# Patient Record
Sex: Male | Born: 1935 | Race: White | Hispanic: No | Marital: Married | Smoking: Former smoker
Health system: Southern US, Community
[De-identification: ages and names within clinical notes are randomized; demographics above are authoritative.]

## PROBLEM LIST (undated history)

## (undated) DIAGNOSIS — Z8719 Personal history of other diseases of the digestive system: Secondary | ICD-10-CM

## (undated) DIAGNOSIS — G454 Transient global amnesia: Secondary | ICD-10-CM

## (undated) DIAGNOSIS — R413 Other amnesia: Secondary | ICD-10-CM

## (undated) DIAGNOSIS — E785 Hyperlipidemia, unspecified: Secondary | ICD-10-CM

## (undated) DIAGNOSIS — N400 Enlarged prostate without lower urinary tract symptoms: Secondary | ICD-10-CM

## (undated) DIAGNOSIS — R0600 Dyspnea, unspecified: Secondary | ICD-10-CM

## (undated) DIAGNOSIS — J449 Chronic obstructive pulmonary disease, unspecified: Secondary | ICD-10-CM

## (undated) DIAGNOSIS — K219 Gastro-esophageal reflux disease without esophagitis: Secondary | ICD-10-CM

## (undated) DIAGNOSIS — M1990 Unspecified osteoarthritis, unspecified site: Secondary | ICD-10-CM

## (undated) DIAGNOSIS — C4491 Basal cell carcinoma of skin, unspecified: Secondary | ICD-10-CM

## (undated) DIAGNOSIS — R42 Dizziness and giddiness: Secondary | ICD-10-CM

## (undated) DIAGNOSIS — M353 Polymyalgia rheumatica: Secondary | ICD-10-CM

## (undated) DIAGNOSIS — J45909 Unspecified asthma, uncomplicated: Secondary | ICD-10-CM

## (undated) DIAGNOSIS — I130 Hypertensive heart and chronic kidney disease with heart failure and stage 1 through stage 4 chronic kidney disease, or unspecified chronic kidney disease: Secondary | ICD-10-CM

## (undated) DIAGNOSIS — N184 Chronic kidney disease, stage 4 (severe): Secondary | ICD-10-CM

## (undated) DIAGNOSIS — I10 Essential (primary) hypertension: Secondary | ICD-10-CM

## (undated) DIAGNOSIS — L719 Rosacea, unspecified: Secondary | ICD-10-CM

## (undated) DIAGNOSIS — G2581 Restless legs syndrome: Secondary | ICD-10-CM

## (undated) DIAGNOSIS — G4730 Sleep apnea, unspecified: Secondary | ICD-10-CM

## (undated) HISTORY — DX: Benign prostatic hyperplasia without lower urinary tract symptoms: N40.0

## (undated) HISTORY — PX: SHOULDER SURGERY: SHX246

## (undated) HISTORY — PX: OTHER SURGICAL HISTORY: SHX169

## (undated) HISTORY — DX: Other amnesia: R41.3

## (undated) HISTORY — DX: Hypertensive heart and chronic kidney disease with heart failure and stage 1 through stage 4 chronic kidney disease, or unspecified chronic kidney disease: I13.0

## (undated) HISTORY — DX: Restless legs syndrome: G25.81

## (undated) HISTORY — DX: Personal history of other diseases of the digestive system: Z87.19

## (undated) HISTORY — DX: Unspecified asthma, uncomplicated: J45.909

## (undated) HISTORY — DX: Dizziness and giddiness: R42

## (undated) HISTORY — DX: Chronic obstructive pulmonary disease, unspecified: J44.9

## (undated) HISTORY — DX: Polymyalgia rheumatica: M35.3

## (undated) HISTORY — DX: Hyperlipidemia, unspecified: E78.5

## (undated) HISTORY — DX: Transient global amnesia: G45.4

## (undated) HISTORY — DX: Gastro-esophageal reflux disease without esophagitis: K21.9

## (undated) HISTORY — DX: Hypocalcemia: E83.51

## (undated) HISTORY — DX: Chronic kidney disease, stage 4 (severe): N18.4

## (undated) HISTORY — DX: Rosacea, unspecified: L71.9

## (undated) HISTORY — DX: Sleep apnea, unspecified: G47.30

## (undated) HISTORY — PX: CARDIAC CATHETERIZATION: SHX172

---

## 1978-06-12 HISTORY — PX: BACK SURGERY: SHX140

## 2015-05-10 DIAGNOSIS — E785 Hyperlipidemia, unspecified: Secondary | ICD-10-CM | POA: Insufficient documentation

## 2015-06-17 DIAGNOSIS — J449 Chronic obstructive pulmonary disease, unspecified: Secondary | ICD-10-CM | POA: Diagnosis not present

## 2015-06-17 DIAGNOSIS — G2581 Restless legs syndrome: Secondary | ICD-10-CM | POA: Diagnosis not present

## 2015-06-17 DIAGNOSIS — R918 Other nonspecific abnormal finding of lung field: Secondary | ICD-10-CM | POA: Diagnosis not present

## 2015-06-30 DIAGNOSIS — E785 Hyperlipidemia, unspecified: Secondary | ICD-10-CM | POA: Diagnosis not present

## 2015-06-30 DIAGNOSIS — I10 Essential (primary) hypertension: Secondary | ICD-10-CM | POA: Diagnosis not present

## 2015-07-08 DIAGNOSIS — M65341 Trigger finger, right ring finger: Secondary | ICD-10-CM | POA: Diagnosis not present

## 2015-07-08 DIAGNOSIS — M65322 Trigger finger, left index finger: Secondary | ICD-10-CM | POA: Diagnosis not present

## 2015-07-08 DIAGNOSIS — M7541 Impingement syndrome of right shoulder: Secondary | ICD-10-CM | POA: Diagnosis not present

## 2015-07-08 DIAGNOSIS — M7711 Lateral epicondylitis, right elbow: Secondary | ICD-10-CM | POA: Diagnosis not present

## 2015-07-19 DIAGNOSIS — M7541 Impingement syndrome of right shoulder: Secondary | ICD-10-CM | POA: Diagnosis not present

## 2015-07-27 DIAGNOSIS — M7541 Impingement syndrome of right shoulder: Secondary | ICD-10-CM | POA: Diagnosis not present

## 2015-08-05 DIAGNOSIS — D649 Anemia, unspecified: Secondary | ICD-10-CM | POA: Insufficient documentation

## 2015-08-05 DIAGNOSIS — D7589 Other specified diseases of blood and blood-forming organs: Secondary | ICD-10-CM | POA: Insufficient documentation

## 2015-08-05 DIAGNOSIS — R7989 Other specified abnormal findings of blood chemistry: Secondary | ICD-10-CM | POA: Insufficient documentation

## 2015-08-05 DIAGNOSIS — L719 Rosacea, unspecified: Secondary | ICD-10-CM | POA: Diagnosis not present

## 2015-08-05 DIAGNOSIS — E785 Hyperlipidemia, unspecified: Secondary | ICD-10-CM | POA: Insufficient documentation

## 2015-08-05 DIAGNOSIS — L820 Inflamed seborrheic keratosis: Secondary | ICD-10-CM | POA: Diagnosis not present

## 2015-08-05 DIAGNOSIS — R222 Localized swelling, mass and lump, trunk: Secondary | ICD-10-CM | POA: Insufficient documentation

## 2015-08-06 DIAGNOSIS — R946 Abnormal results of thyroid function studies: Secondary | ICD-10-CM | POA: Diagnosis not present

## 2015-08-06 DIAGNOSIS — K219 Gastro-esophageal reflux disease without esophagitis: Secondary | ICD-10-CM | POA: Diagnosis not present

## 2015-08-06 DIAGNOSIS — Z0000 Encounter for general adult medical examination without abnormal findings: Secondary | ICD-10-CM | POA: Diagnosis not present

## 2015-08-06 DIAGNOSIS — N183 Chronic kidney disease, stage 3 (moderate): Secondary | ICD-10-CM | POA: Diagnosis not present

## 2015-08-06 DIAGNOSIS — I10 Essential (primary) hypertension: Secondary | ICD-10-CM | POA: Diagnosis not present

## 2015-08-06 DIAGNOSIS — J4520 Mild intermittent asthma, uncomplicated: Secondary | ICD-10-CM | POA: Diagnosis not present

## 2015-08-06 DIAGNOSIS — E7801 Familial hypercholesterolemia: Secondary | ICD-10-CM | POA: Diagnosis not present

## 2015-08-06 DIAGNOSIS — I130 Hypertensive heart and chronic kidney disease with heart failure and stage 1 through stage 4 chronic kidney disease, or unspecified chronic kidney disease: Secondary | ICD-10-CM | POA: Diagnosis not present

## 2015-08-06 DIAGNOSIS — I509 Heart failure, unspecified: Secondary | ICD-10-CM | POA: Diagnosis not present

## 2015-08-09 DIAGNOSIS — K219 Gastro-esophageal reflux disease without esophagitis: Secondary | ICD-10-CM | POA: Insufficient documentation

## 2015-08-20 DIAGNOSIS — N401 Enlarged prostate with lower urinary tract symptoms: Secondary | ICD-10-CM | POA: Diagnosis not present

## 2015-08-20 DIAGNOSIS — N138 Other obstructive and reflux uropathy: Secondary | ICD-10-CM | POA: Diagnosis not present

## 2015-08-26 DIAGNOSIS — Z125 Encounter for screening for malignant neoplasm of prostate: Secondary | ICD-10-CM | POA: Diagnosis not present

## 2015-08-27 DIAGNOSIS — J22 Unspecified acute lower respiratory infection: Secondary | ICD-10-CM | POA: Diagnosis not present

## 2015-09-02 DIAGNOSIS — K869 Disease of pancreas, unspecified: Secondary | ICD-10-CM | POA: Diagnosis not present

## 2015-09-02 DIAGNOSIS — R05 Cough: Secondary | ICD-10-CM | POA: Diagnosis not present

## 2015-09-02 DIAGNOSIS — N400 Enlarged prostate without lower urinary tract symptoms: Secondary | ICD-10-CM | POA: Diagnosis not present

## 2015-09-06 DIAGNOSIS — J22 Unspecified acute lower respiratory infection: Secondary | ICD-10-CM | POA: Diagnosis not present

## 2015-09-13 DIAGNOSIS — J42 Unspecified chronic bronchitis: Secondary | ICD-10-CM | POA: Diagnosis not present

## 2015-09-14 DIAGNOSIS — K760 Fatty (change of) liver, not elsewhere classified: Secondary | ICD-10-CM | POA: Diagnosis not present

## 2015-09-14 DIAGNOSIS — K862 Cyst of pancreas: Secondary | ICD-10-CM | POA: Diagnosis not present

## 2015-09-14 DIAGNOSIS — K7689 Other specified diseases of liver: Secondary | ICD-10-CM | POA: Diagnosis not present

## 2015-09-14 DIAGNOSIS — K8689 Other specified diseases of pancreas: Secondary | ICD-10-CM | POA: Diagnosis not present

## 2015-09-14 DIAGNOSIS — N281 Cyst of kidney, acquired: Secondary | ICD-10-CM | POA: Diagnosis not present

## 2015-09-14 DIAGNOSIS — K863 Pseudocyst of pancreas: Secondary | ICD-10-CM | POA: Diagnosis not present

## 2015-09-14 DIAGNOSIS — K869 Disease of pancreas, unspecified: Secondary | ICD-10-CM | POA: Diagnosis not present

## 2015-09-27 DIAGNOSIS — R918 Other nonspecific abnormal finding of lung field: Secondary | ICD-10-CM | POA: Diagnosis not present

## 2015-09-27 DIAGNOSIS — J449 Chronic obstructive pulmonary disease, unspecified: Secondary | ICD-10-CM | POA: Diagnosis not present

## 2015-09-30 DIAGNOSIS — R918 Other nonspecific abnormal finding of lung field: Secondary | ICD-10-CM | POA: Diagnosis not present

## 2015-09-30 DIAGNOSIS — R0600 Dyspnea, unspecified: Secondary | ICD-10-CM | POA: Diagnosis not present

## 2015-09-30 DIAGNOSIS — J449 Chronic obstructive pulmonary disease, unspecified: Secondary | ICD-10-CM | POA: Diagnosis not present

## 2015-09-30 DIAGNOSIS — G2581 Restless legs syndrome: Secondary | ICD-10-CM | POA: Diagnosis not present

## 2015-10-08 DIAGNOSIS — Z880 Allergy status to penicillin: Secondary | ICD-10-CM | POA: Diagnosis not present

## 2015-10-08 DIAGNOSIS — I10 Essential (primary) hypertension: Secondary | ICD-10-CM | POA: Diagnosis not present

## 2015-10-08 DIAGNOSIS — Z885 Allergy status to narcotic agent status: Secondary | ICD-10-CM | POA: Diagnosis not present

## 2015-10-08 DIAGNOSIS — R040 Epistaxis: Secondary | ICD-10-CM | POA: Diagnosis not present

## 2015-10-13 DIAGNOSIS — J4520 Mild intermittent asthma, uncomplicated: Secondary | ICD-10-CM | POA: Diagnosis not present

## 2015-10-13 DIAGNOSIS — J42 Unspecified chronic bronchitis: Secondary | ICD-10-CM | POA: Diagnosis not present

## 2015-10-13 DIAGNOSIS — K5900 Constipation, unspecified: Secondary | ICD-10-CM | POA: Diagnosis not present

## 2015-10-13 DIAGNOSIS — R1310 Dysphagia, unspecified: Secondary | ICD-10-CM | POA: Diagnosis not present

## 2015-10-13 DIAGNOSIS — R1013 Epigastric pain: Secondary | ICD-10-CM | POA: Diagnosis not present

## 2015-10-13 DIAGNOSIS — K5730 Diverticulosis of large intestine without perforation or abscess without bleeding: Secondary | ICD-10-CM | POA: Diagnosis not present

## 2015-10-13 DIAGNOSIS — J3089 Other allergic rhinitis: Secondary | ICD-10-CM | POA: Insufficient documentation

## 2015-10-15 DIAGNOSIS — K219 Gastro-esophageal reflux disease without esophagitis: Secondary | ICD-10-CM | POA: Diagnosis not present

## 2015-10-15 DIAGNOSIS — K449 Diaphragmatic hernia without obstruction or gangrene: Secondary | ICD-10-CM | POA: Diagnosis not present

## 2015-10-15 DIAGNOSIS — R1310 Dysphagia, unspecified: Secondary | ICD-10-CM | POA: Diagnosis not present

## 2015-11-02 DIAGNOSIS — D485 Neoplasm of uncertain behavior of skin: Secondary | ICD-10-CM | POA: Diagnosis not present

## 2015-11-02 DIAGNOSIS — L570 Actinic keratosis: Secondary | ICD-10-CM | POA: Diagnosis not present

## 2015-11-10 DIAGNOSIS — I952 Hypotension due to drugs: Secondary | ICD-10-CM | POA: Diagnosis not present

## 2015-11-12 DIAGNOSIS — H2589 Other age-related cataract: Secondary | ICD-10-CM | POA: Diagnosis not present

## 2015-11-18 DIAGNOSIS — J4520 Mild intermittent asthma, uncomplicated: Secondary | ICD-10-CM | POA: Diagnosis not present

## 2015-11-18 DIAGNOSIS — N401 Enlarged prostate with lower urinary tract symptoms: Secondary | ICD-10-CM | POA: Insufficient documentation

## 2015-11-18 DIAGNOSIS — G2581 Restless legs syndrome: Secondary | ICD-10-CM | POA: Diagnosis not present

## 2015-11-18 DIAGNOSIS — J3089 Other allergic rhinitis: Secondary | ICD-10-CM | POA: Diagnosis not present

## 2015-11-18 DIAGNOSIS — J42 Unspecified chronic bronchitis: Secondary | ICD-10-CM | POA: Diagnosis not present

## 2015-11-25 DIAGNOSIS — K5730 Diverticulosis of large intestine without perforation or abscess without bleeding: Secondary | ICD-10-CM | POA: Diagnosis not present

## 2015-11-25 DIAGNOSIS — R1310 Dysphagia, unspecified: Secondary | ICD-10-CM | POA: Diagnosis not present

## 2015-11-25 DIAGNOSIS — R1013 Epigastric pain: Secondary | ICD-10-CM | POA: Diagnosis not present

## 2015-12-27 DIAGNOSIS — E785 Hyperlipidemia, unspecified: Secondary | ICD-10-CM | POA: Diagnosis not present

## 2015-12-27 DIAGNOSIS — I10 Essential (primary) hypertension: Secondary | ICD-10-CM | POA: Diagnosis not present

## 2015-12-28 DIAGNOSIS — N401 Enlarged prostate with lower urinary tract symptoms: Secondary | ICD-10-CM | POA: Diagnosis not present

## 2015-12-28 DIAGNOSIS — I509 Heart failure, unspecified: Secondary | ICD-10-CM | POA: Diagnosis not present

## 2015-12-28 DIAGNOSIS — R7989 Other specified abnormal findings of blood chemistry: Secondary | ICD-10-CM | POA: Diagnosis not present

## 2015-12-28 DIAGNOSIS — J3089 Other allergic rhinitis: Secondary | ICD-10-CM | POA: Diagnosis not present

## 2015-12-28 DIAGNOSIS — I130 Hypertensive heart and chronic kidney disease with heart failure and stage 1 through stage 4 chronic kidney disease, or unspecified chronic kidney disease: Secondary | ICD-10-CM | POA: Diagnosis not present

## 2015-12-28 DIAGNOSIS — J42 Unspecified chronic bronchitis: Secondary | ICD-10-CM | POA: Diagnosis not present

## 2015-12-28 DIAGNOSIS — N183 Chronic kidney disease, stage 3 (moderate): Secondary | ICD-10-CM | POA: Diagnosis not present

## 2015-12-28 DIAGNOSIS — K219 Gastro-esophageal reflux disease without esophagitis: Secondary | ICD-10-CM | POA: Diagnosis not present

## 2015-12-28 DIAGNOSIS — R5383 Other fatigue: Secondary | ICD-10-CM | POA: Diagnosis not present

## 2016-01-12 DIAGNOSIS — M7541 Impingement syndrome of right shoulder: Secondary | ICD-10-CM | POA: Diagnosis not present

## 2016-02-07 DIAGNOSIS — G4733 Obstructive sleep apnea (adult) (pediatric): Secondary | ICD-10-CM | POA: Diagnosis not present

## 2016-02-07 DIAGNOSIS — R918 Other nonspecific abnormal finding of lung field: Secondary | ICD-10-CM | POA: Diagnosis not present

## 2016-02-07 DIAGNOSIS — R0600 Dyspnea, unspecified: Secondary | ICD-10-CM | POA: Diagnosis not present

## 2016-02-07 DIAGNOSIS — J449 Chronic obstructive pulmonary disease, unspecified: Secondary | ICD-10-CM | POA: Diagnosis not present

## 2016-02-07 DIAGNOSIS — G2581 Restless legs syndrome: Secondary | ICD-10-CM | POA: Diagnosis not present

## 2016-02-22 DIAGNOSIS — G4733 Obstructive sleep apnea (adult) (pediatric): Secondary | ICD-10-CM | POA: Diagnosis not present

## 2016-03-07 DIAGNOSIS — J449 Chronic obstructive pulmonary disease, unspecified: Secondary | ICD-10-CM | POA: Diagnosis not present

## 2016-03-07 DIAGNOSIS — R0600 Dyspnea, unspecified: Secondary | ICD-10-CM | POA: Diagnosis not present

## 2016-03-07 DIAGNOSIS — R918 Other nonspecific abnormal finding of lung field: Secondary | ICD-10-CM | POA: Diagnosis not present

## 2016-03-07 DIAGNOSIS — G2581 Restless legs syndrome: Secondary | ICD-10-CM | POA: Diagnosis not present

## 2016-03-23 DIAGNOSIS — G4733 Obstructive sleep apnea (adult) (pediatric): Secondary | ICD-10-CM | POA: Diagnosis not present

## 2016-03-31 DIAGNOSIS — J449 Chronic obstructive pulmonary disease, unspecified: Secondary | ICD-10-CM | POA: Diagnosis not present

## 2016-03-31 DIAGNOSIS — Z23 Encounter for immunization: Secondary | ICD-10-CM | POA: Diagnosis not present

## 2016-03-31 DIAGNOSIS — G4733 Obstructive sleep apnea (adult) (pediatric): Secondary | ICD-10-CM | POA: Diagnosis not present

## 2016-04-04 DIAGNOSIS — G4733 Obstructive sleep apnea (adult) (pediatric): Secondary | ICD-10-CM | POA: Diagnosis not present

## 2016-04-06 DIAGNOSIS — D0461 Carcinoma in situ of skin of right upper limb, including shoulder: Secondary | ICD-10-CM | POA: Diagnosis not present

## 2016-04-06 DIAGNOSIS — L821 Other seborrheic keratosis: Secondary | ICD-10-CM | POA: Diagnosis not present

## 2016-04-06 DIAGNOSIS — L578 Other skin changes due to chronic exposure to nonionizing radiation: Secondary | ICD-10-CM | POA: Diagnosis not present

## 2016-04-06 DIAGNOSIS — L570 Actinic keratosis: Secondary | ICD-10-CM | POA: Diagnosis not present

## 2016-04-10 DIAGNOSIS — I10 Essential (primary) hypertension: Secondary | ICD-10-CM | POA: Diagnosis not present

## 2016-04-10 DIAGNOSIS — G4700 Insomnia, unspecified: Secondary | ICD-10-CM | POA: Diagnosis not present

## 2016-04-10 DIAGNOSIS — N401 Enlarged prostate with lower urinary tract symptoms: Secondary | ICD-10-CM | POA: Diagnosis not present

## 2016-04-11 DIAGNOSIS — M7541 Impingement syndrome of right shoulder: Secondary | ICD-10-CM | POA: Diagnosis not present

## 2016-04-11 DIAGNOSIS — G5620 Lesion of ulnar nerve, unspecified upper limb: Secondary | ICD-10-CM | POA: Diagnosis not present

## 2016-04-17 DIAGNOSIS — G4700 Insomnia, unspecified: Secondary | ICD-10-CM | POA: Insufficient documentation

## 2016-04-25 DIAGNOSIS — I951 Orthostatic hypotension: Secondary | ICD-10-CM | POA: Diagnosis not present

## 2016-04-25 DIAGNOSIS — R5383 Other fatigue: Secondary | ICD-10-CM | POA: Insufficient documentation

## 2016-04-25 DIAGNOSIS — I10 Essential (primary) hypertension: Secondary | ICD-10-CM | POA: Diagnosis not present

## 2016-04-25 DIAGNOSIS — R946 Abnormal results of thyroid function studies: Secondary | ICD-10-CM | POA: Diagnosis not present

## 2016-04-25 DIAGNOSIS — J42 Unspecified chronic bronchitis: Secondary | ICD-10-CM | POA: Diagnosis not present

## 2016-04-25 DIAGNOSIS — R7989 Other specified abnormal findings of blood chemistry: Secondary | ICD-10-CM | POA: Insufficient documentation

## 2016-04-25 DIAGNOSIS — D649 Anemia, unspecified: Secondary | ICD-10-CM | POA: Diagnosis not present

## 2016-04-26 DIAGNOSIS — K648 Other hemorrhoids: Secondary | ICD-10-CM | POA: Diagnosis not present

## 2016-04-26 DIAGNOSIS — K5900 Constipation, unspecified: Secondary | ICD-10-CM | POA: Diagnosis not present

## 2016-04-26 DIAGNOSIS — K219 Gastro-esophageal reflux disease without esophagitis: Secondary | ICD-10-CM | POA: Diagnosis not present

## 2016-04-26 DIAGNOSIS — R1032 Left lower quadrant pain: Secondary | ICD-10-CM | POA: Diagnosis not present

## 2016-05-01 DIAGNOSIS — E7800 Pure hypercholesterolemia, unspecified: Secondary | ICD-10-CM | POA: Diagnosis not present

## 2016-05-01 DIAGNOSIS — N183 Chronic kidney disease, stage 3 (moderate): Secondary | ICD-10-CM | POA: Diagnosis not present

## 2016-05-01 DIAGNOSIS — H9193 Unspecified hearing loss, bilateral: Secondary | ICD-10-CM | POA: Diagnosis not present

## 2016-05-01 DIAGNOSIS — K219 Gastro-esophageal reflux disease without esophagitis: Secondary | ICD-10-CM | POA: Diagnosis not present

## 2016-05-01 DIAGNOSIS — R946 Abnormal results of thyroid function studies: Secondary | ICD-10-CM | POA: Diagnosis not present

## 2016-05-01 DIAGNOSIS — G2581 Restless legs syndrome: Secondary | ICD-10-CM | POA: Diagnosis not present

## 2016-05-01 DIAGNOSIS — G4730 Sleep apnea, unspecified: Secondary | ICD-10-CM | POA: Diagnosis not present

## 2016-05-01 DIAGNOSIS — D649 Anemia, unspecified: Secondary | ICD-10-CM | POA: Diagnosis not present

## 2016-05-01 DIAGNOSIS — N400 Enlarged prostate without lower urinary tract symptoms: Secondary | ICD-10-CM | POA: Diagnosis not present

## 2016-05-01 DIAGNOSIS — J45909 Unspecified asthma, uncomplicated: Secondary | ICD-10-CM | POA: Diagnosis not present

## 2016-05-01 DIAGNOSIS — I130 Hypertensive heart and chronic kidney disease with heart failure and stage 1 through stage 4 chronic kidney disease, or unspecified chronic kidney disease: Secondary | ICD-10-CM | POA: Diagnosis not present

## 2016-05-02 DIAGNOSIS — K869 Disease of pancreas, unspecified: Secondary | ICD-10-CM | POA: Diagnosis not present

## 2016-05-02 DIAGNOSIS — K862 Cyst of pancreas: Secondary | ICD-10-CM | POA: Diagnosis not present

## 2016-05-15 DIAGNOSIS — G4733 Obstructive sleep apnea (adult) (pediatric): Secondary | ICD-10-CM | POA: Diagnosis not present

## 2016-05-16 DIAGNOSIS — G2581 Restless legs syndrome: Secondary | ICD-10-CM | POA: Diagnosis not present

## 2016-05-16 DIAGNOSIS — R0600 Dyspnea, unspecified: Secondary | ICD-10-CM | POA: Diagnosis not present

## 2016-05-16 DIAGNOSIS — R918 Other nonspecific abnormal finding of lung field: Secondary | ICD-10-CM | POA: Diagnosis not present

## 2016-05-16 DIAGNOSIS — G4733 Obstructive sleep apnea (adult) (pediatric): Secondary | ICD-10-CM | POA: Diagnosis not present

## 2016-05-30 DIAGNOSIS — G4730 Sleep apnea, unspecified: Secondary | ICD-10-CM | POA: Diagnosis not present

## 2016-05-30 DIAGNOSIS — R413 Other amnesia: Secondary | ICD-10-CM | POA: Diagnosis not present

## 2016-05-30 DIAGNOSIS — G2581 Restless legs syndrome: Secondary | ICD-10-CM | POA: Diagnosis not present

## 2016-05-30 DIAGNOSIS — K219 Gastro-esophageal reflux disease without esophagitis: Secondary | ICD-10-CM | POA: Diagnosis not present

## 2016-05-30 DIAGNOSIS — N183 Chronic kidney disease, stage 3 (moderate): Secondary | ICD-10-CM | POA: Diagnosis not present

## 2016-05-30 DIAGNOSIS — J449 Chronic obstructive pulmonary disease, unspecified: Secondary | ICD-10-CM | POA: Diagnosis not present

## 2016-05-30 DIAGNOSIS — Z8719 Personal history of other diseases of the digestive system: Secondary | ICD-10-CM | POA: Diagnosis not present

## 2016-05-30 DIAGNOSIS — H9193 Unspecified hearing loss, bilateral: Secondary | ICD-10-CM | POA: Diagnosis not present

## 2016-05-30 DIAGNOSIS — E7800 Pure hypercholesterolemia, unspecified: Secondary | ICD-10-CM | POA: Diagnosis not present

## 2016-05-30 DIAGNOSIS — N400 Enlarged prostate without lower urinary tract symptoms: Secondary | ICD-10-CM | POA: Diagnosis not present

## 2016-05-30 DIAGNOSIS — J45909 Unspecified asthma, uncomplicated: Secondary | ICD-10-CM | POA: Diagnosis not present

## 2016-05-30 DIAGNOSIS — I130 Hypertensive heart and chronic kidney disease with heart failure and stage 1 through stage 4 chronic kidney disease, or unspecified chronic kidney disease: Secondary | ICD-10-CM | POA: Diagnosis not present

## 2016-07-04 DIAGNOSIS — R918 Other nonspecific abnormal finding of lung field: Secondary | ICD-10-CM | POA: Diagnosis not present

## 2016-07-04 DIAGNOSIS — J449 Chronic obstructive pulmonary disease, unspecified: Secondary | ICD-10-CM | POA: Diagnosis not present

## 2016-07-04 DIAGNOSIS — G4733 Obstructive sleep apnea (adult) (pediatric): Secondary | ICD-10-CM | POA: Diagnosis not present

## 2016-07-04 DIAGNOSIS — G2581 Restless legs syndrome: Secondary | ICD-10-CM | POA: Diagnosis not present

## 2016-07-07 DIAGNOSIS — I2510 Atherosclerotic heart disease of native coronary artery without angina pectoris: Secondary | ICD-10-CM | POA: Diagnosis not present

## 2016-07-07 DIAGNOSIS — R918 Other nonspecific abnormal finding of lung field: Secondary | ICD-10-CM | POA: Diagnosis not present

## 2016-07-12 ENCOUNTER — Encounter: Payer: Self-pay | Admitting: Cardiology

## 2016-07-12 ENCOUNTER — Ambulatory Visit (INDEPENDENT_AMBULATORY_CARE_PROVIDER_SITE_OTHER): Payer: Medicare Other | Admitting: Cardiology

## 2016-07-12 VITALS — BP 164/102 | HR 88 | Ht 69.0 in | Wt 173.1 lb

## 2016-07-12 DIAGNOSIS — I10 Essential (primary) hypertension: Secondary | ICD-10-CM

## 2016-07-12 DIAGNOSIS — R0602 Shortness of breath: Secondary | ICD-10-CM

## 2016-07-12 DIAGNOSIS — J438 Other emphysema: Secondary | ICD-10-CM | POA: Diagnosis not present

## 2016-07-12 NOTE — Progress Notes (Signed)
Cardiology Office Note    Date:  07/12/2016   ID:  [name], DOB Aug 13, 1932, MRN [id number]  PCP:  Lance Kroner, MD  Cardiologist:   Candee Furbish, MD   No chief complaint on file.   History of Present Illness:  [name] is a 83 y.o. male here for evaluation of possible heart failure at the request of Dr. Antony Contras. Used to see a cardiologist in [place], Dr. Geraldo Lewis, last visit 12/27/15.  His main complaint is shortness of breath with minimal exertion. He has been diagnosed with fairly significant COPD, utilizes BiPAP at night with obstructive sleep apnea. Seems compliant with his medications. Former smoker for 50 years. Quit in 1980's.  In careful review of his records on epic, under care everywhere, I was able to find a cardiac catheterization report from 2016 which showed normal coronary arteries, normal left ventricular ejection fraction, normal left ventricular end-diastolic pressure of 9 mmHg. Reassuring. His blood pressure at that last visit was 110/68.  His son and wife state that he takes his losartan in the evening hours to try to help lower his blood pressure in the morning when he wakes up when it is usually the highest. By midafternoon it is usually low/normal. Sometimes if he gets down into the 110 range he may feel somewhat dizzy.  He is short of breath when walking to and from the mailbox. He was previously not interested in pulmonary rehabilitation in [place]. He does have a treadmill at home.     Creatinine 0.92, hemoglobin 15.3  Has a history of CHF. COPD. BiPAP. Some memory impairment.     Past Medical History:  Diagnosis Date  . Acne rosacea   . Asthma   . COPD (chronic obstructive pulmonary disease) ([place])   . GERD without esophagitis   . Hx of pancreatitis   . Hyperlipidemia   . 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 Memory loss   . Prostatic hyperplasia   . Restless leg syndrome   . Sleep apnea       No past surgical history on file.  Current Medications: Outpatient Medications Prior to Visit  Medication Sig Dispense Refill  . fluticasone furoate-vilanterol (BREO ELLIPTA) 200-25 MCG/INH AEPB Inhale 1 puff into the lungs daily.    Marland Kitchen losartan (COZAAR) 50 MG tablet 50 mg. TAKE 1/2 TABLET BY MOUTH DAILY    . montelukast (SINGULAIR) 10 MG tablet Take 10 mg by mouth at bedtime.    Marland Kitchen omeprazole (PRILOSEC) 20 MG capsule Take 20 mg by mouth daily.    Marland Kitchen rOPINIRole (REQUIP) 1 MG tablet Take 1 mg by mouth at bedtime. TAKE 1 TO 3 HOURS BEFORE BED    . tamsulosin (FLOMAX) 0.4 MG CAPS capsule Take 0.4 mg by mouth.    . traZODone (DESYREL) 50 MG tablet Take 50 mg by mouth at bedtime.     No facility-administered medications prior to visit.      Allergies:   Codeine and Penicillins   Social History   Social History  . Marital status: Married    Spouse name: N/A  . Number of children: N/A  . Years of education: N/A   Social History Main Topics  . Smoking status: Former Smoker    Types: Cigarettes    Quit date: 07/10/1978  . Smokeless tobacco: Never Used  . Alcohol use Not on file  . Drug use: Unknown  . Sexual activity: No   Other  Topics Concern  . Not on file   Social History Narrative  . No narrative on file     Family History:  The patient's family history includes CVA in his mother; Cancer - Lung in his brother and brother; Cancer - Prostate in his father; Hypertension in his mother.   ROS:   Please see the history of present illness.    ROS All other systems reviewed and are negative.   PHYSICAL EXAM:   VS:  BP (!) 164/102 (BP Location: Left Arm)   Pulse 88   Ht 5\' 9"  (1.753 m)   Wt 173 lb 1.9 oz (78.5 kg)   BMI 25.57 kg/m    GEN: Well nourished, well developed, in no acute distress  HEENT: normal  Neck: no JVD, carotid bruits, or masses Cardiac: RRR; no murmurs, rubs, or gallops,no edema  Respiratory:  clear to auscultation bilaterally, normal work of  breathing GI: soft, nontender, nondistended, + BS MS: no deformity or atrophy  Skin: warm and dry, no rash Neuro:  Alert and Oriented x 3, Strength and sensation are intact Psych: euthymic mood, full affect  Wt Readings from Last 3 Encounters:  07/12/16 173 lb 1.9 oz (78.5 kg)      Studies/Labs Reviewed:   EKG:  EKG is ordered today.  The ekg ordered today demonstrates 07/12/16-sinus rhythm, left axis deviation, heart rate 80 bpm, old inferior infarct pattern (no signs of infarction on cardiac catheterization)  Recent Labs: No results found for requested labs within last 8760 hours.   Lipid Panel No results found for: CHOL, TRIG, HDL, CHOLHDL, VLDL, LDLCALC, LDLDIRECT  Additional studies/ records that were reviewed today include:   Cardiac catheterization 05/28/15-normal coronary arteries, normal ejection fraction, normal left ventricular end-diastolic pressure 9.    ASSESSMENT:    1. SOB (shortness of breath)   2. Other emphysema ([place])   3. Essential hypertension      PLAN:  In order of problems listed above:  Dyspnea on exertion  - Majority of his symptoms are related to his underlying COPD.  - Cardiac catheterization was quite reassuring. Normal EF. No evidence of systolic heart failure. In fact, his left ventricular end-diastolic pressure was normal suggesting no evidence of diastolic heart failure as well.  - I will check an echocardiogram to ensure that there is been no change since 2016.  - I also explained to him that sometimes when people are short of breath from a pulmonary perspective, and there echocardiogram demonstrates diastolic dysfunction which is fairly normal given his advanced age, that patient sometimes get labeled with diastolic heart failure. Elevated blood pressure can also exacerbate this as well. Overall, treatment of blood pressure and COPD is goal.  COPD  - Continued medication use.  - Continue to encourage daily walking. He has a treadmill at  home.  Essential hypertension/labile  - Agree with Dr. Laqueta Linden management-losartan being taken in the evening to help hopefully early morning blood pressures.  - Decrease salt intake-he does admit to eating potato chips occasionally.  - Could consider use of carvedilol in the future if further agent is needed. He did state that occasionally he will feel his heart rate increased.   Please let me know if I can be of further assistance. Once again reassuring cardiac catheterization.  Medication Adjustments/Labs and Tests Ordered: Current medicines are reviewed at length with the patient today.  Concerns regarding medicines are outlined above.  Medication changes, Labs and Tests ordered today are listed in the Patient  Instructions below. Patient Instructions  Medication Instructions:  The current medical regimen is effective;  continue present plan and medications.  Testing/Procedures: Your physician has requested that you have an echocardiogram. Echocardiography is a painless test that uses sound waves to create images of your heart. It provides your doctor with information about the size and shape of your heart and how well your heart's chambers and valves are working. This procedure takes approximately one hour. There are no restrictions for this procedure.  Follow-Up: Follow up as needed after the above testing.  Thank you for choosing [facility]!!        Signed, Candee Furbish, MD  07/12/2016 12:13 PM    [facility] [place], [place], [place]  [address] Phone: [phone]; Fax: [phone]

## 2016-07-12 NOTE — Patient Instructions (Signed)
Medication Instructions:  The current medical regimen is effective;  continue present plan and medications.  Testing/Procedures: Your physician has requested that you have an echocardiogram. Echocardiography is a painless test that uses sound waves to create images of your heart. It provides your doctor with information about the size and shape of your heart and how well your heart's chambers and valves are working. This procedure takes approximately one hour. There are no restrictions for this procedure.  Follow-Up: Follow up as needed after the above testing.  Thank you for choosing [facility]!!

## 2016-07-25 DIAGNOSIS — M7541 Impingement syndrome of right shoulder: Secondary | ICD-10-CM | POA: Diagnosis not present

## 2016-07-28 ENCOUNTER — Other Ambulatory Visit: Payer: Self-pay

## 2016-07-28 ENCOUNTER — Ambulatory Visit (HOSPITAL_COMMUNITY): Payer: Medicare Other | Attending: Cardiology

## 2016-07-28 DIAGNOSIS — J449 Chronic obstructive pulmonary disease, unspecified: Secondary | ICD-10-CM | POA: Diagnosis not present

## 2016-07-28 DIAGNOSIS — Z87891 Personal history of nicotine dependence: Secondary | ICD-10-CM | POA: Diagnosis not present

## 2016-07-28 DIAGNOSIS — I10 Essential (primary) hypertension: Secondary | ICD-10-CM | POA: Diagnosis not present

## 2016-07-28 DIAGNOSIS — I429 Cardiomyopathy, unspecified: Secondary | ICD-10-CM | POA: Diagnosis not present

## 2016-07-28 DIAGNOSIS — I351 Nonrheumatic aortic (valve) insufficiency: Secondary | ICD-10-CM | POA: Insufficient documentation

## 2016-07-28 DIAGNOSIS — E785 Hyperlipidemia, unspecified: Secondary | ICD-10-CM | POA: Diagnosis not present

## 2016-07-28 DIAGNOSIS — R0602 Shortness of breath: Secondary | ICD-10-CM | POA: Diagnosis not present

## 2016-08-04 DIAGNOSIS — G4733 Obstructive sleep apnea (adult) (pediatric): Secondary | ICD-10-CM | POA: Diagnosis not present

## 2016-08-04 DIAGNOSIS — J449 Chronic obstructive pulmonary disease, unspecified: Secondary | ICD-10-CM | POA: Diagnosis not present

## 2016-08-04 DIAGNOSIS — R918 Other nonspecific abnormal finding of lung field: Secondary | ICD-10-CM | POA: Diagnosis not present

## 2016-08-04 DIAGNOSIS — G2581 Restless legs syndrome: Secondary | ICD-10-CM | POA: Diagnosis not present

## 2016-08-28 DIAGNOSIS — R1032 Left lower quadrant pain: Secondary | ICD-10-CM | POA: Diagnosis not present

## 2016-08-28 DIAGNOSIS — K921 Melena: Secondary | ICD-10-CM | POA: Diagnosis not present

## 2016-08-28 DIAGNOSIS — K5900 Constipation, unspecified: Secondary | ICD-10-CM | POA: Diagnosis not present

## 2016-08-28 DIAGNOSIS — K219 Gastro-esophageal reflux disease without esophagitis: Secondary | ICD-10-CM | POA: Diagnosis not present

## 2016-08-29 DIAGNOSIS — I130 Hypertensive heart and chronic kidney disease with heart failure and stage 1 through stage 4 chronic kidney disease, or unspecified chronic kidney disease: Secondary | ICD-10-CM | POA: Diagnosis not present

## 2016-08-29 DIAGNOSIS — K219 Gastro-esophageal reflux disease without esophagitis: Secondary | ICD-10-CM | POA: Diagnosis not present

## 2016-08-29 DIAGNOSIS — H9193 Unspecified hearing loss, bilateral: Secondary | ICD-10-CM | POA: Diagnosis not present

## 2016-08-29 DIAGNOSIS — Z1389 Encounter for screening for other disorder: Secondary | ICD-10-CM | POA: Diagnosis not present

## 2016-08-29 DIAGNOSIS — N183 Chronic kidney disease, stage 3 (moderate): Secondary | ICD-10-CM | POA: Diagnosis not present

## 2016-08-29 DIAGNOSIS — G4730 Sleep apnea, unspecified: Secondary | ICD-10-CM | POA: Diagnosis not present

## 2016-08-29 DIAGNOSIS — J45909 Unspecified asthma, uncomplicated: Secondary | ICD-10-CM | POA: Diagnosis not present

## 2016-08-29 DIAGNOSIS — N400 Enlarged prostate without lower urinary tract symptoms: Secondary | ICD-10-CM | POA: Diagnosis not present

## 2016-08-29 DIAGNOSIS — Z8719 Personal history of other diseases of the digestive system: Secondary | ICD-10-CM | POA: Diagnosis not present

## 2016-08-29 DIAGNOSIS — G2581 Restless legs syndrome: Secondary | ICD-10-CM | POA: Diagnosis not present

## 2016-08-29 DIAGNOSIS — E7800 Pure hypercholesterolemia, unspecified: Secondary | ICD-10-CM | POA: Diagnosis not present

## 2016-08-29 DIAGNOSIS — J449 Chronic obstructive pulmonary disease, unspecified: Secondary | ICD-10-CM | POA: Diagnosis not present

## 2016-09-23 DIAGNOSIS — J309 Allergic rhinitis, unspecified: Secondary | ICD-10-CM | POA: Diagnosis not present

## 2016-09-23 DIAGNOSIS — J069 Acute upper respiratory infection, unspecified: Secondary | ICD-10-CM | POA: Diagnosis not present

## 2016-09-23 DIAGNOSIS — J449 Chronic obstructive pulmonary disease, unspecified: Secondary | ICD-10-CM | POA: Diagnosis not present

## 2016-10-07 DIAGNOSIS — J449 Chronic obstructive pulmonary disease, unspecified: Secondary | ICD-10-CM | POA: Diagnosis not present

## 2016-10-12 DIAGNOSIS — R040 Epistaxis: Secondary | ICD-10-CM | POA: Diagnosis not present

## 2016-10-13 DIAGNOSIS — R4182 Altered mental status, unspecified: Secondary | ICD-10-CM | POA: Diagnosis not present

## 2016-10-13 DIAGNOSIS — F04 Amnestic disorder due to known physiological condition: Secondary | ICD-10-CM | POA: Diagnosis not present

## 2016-10-13 DIAGNOSIS — I517 Cardiomegaly: Secondary | ICD-10-CM | POA: Diagnosis not present

## 2016-10-13 DIAGNOSIS — I10 Essential (primary) hypertension: Secondary | ICD-10-CM | POA: Diagnosis not present

## 2016-10-13 DIAGNOSIS — G454 Transient global amnesia: Secondary | ICD-10-CM | POA: Diagnosis not present

## 2016-10-13 DIAGNOSIS — J449 Chronic obstructive pulmonary disease, unspecified: Secondary | ICD-10-CM | POA: Diagnosis not present

## 2016-10-13 DIAGNOSIS — R040 Epistaxis: Secondary | ICD-10-CM | POA: Diagnosis not present

## 2016-10-13 DIAGNOSIS — Z79899 Other long term (current) drug therapy: Secondary | ICD-10-CM | POA: Diagnosis not present

## 2016-10-13 DIAGNOSIS — G4733 Obstructive sleep apnea (adult) (pediatric): Secondary | ICD-10-CM | POA: Diagnosis not present

## 2016-10-13 DIAGNOSIS — E785 Hyperlipidemia, unspecified: Secondary | ICD-10-CM | POA: Diagnosis not present

## 2016-10-13 DIAGNOSIS — K219 Gastro-esophageal reflux disease without esophagitis: Secondary | ICD-10-CM | POA: Diagnosis not present

## 2016-10-14 DIAGNOSIS — R040 Epistaxis: Secondary | ICD-10-CM | POA: Diagnosis not present

## 2016-10-14 DIAGNOSIS — I517 Cardiomegaly: Secondary | ICD-10-CM | POA: Diagnosis not present

## 2016-10-14 DIAGNOSIS — I10 Essential (primary) hypertension: Secondary | ICD-10-CM | POA: Diagnosis not present

## 2016-10-14 DIAGNOSIS — R4182 Altered mental status, unspecified: Secondary | ICD-10-CM | POA: Diagnosis not present

## 2016-10-14 DIAGNOSIS — G454 Transient global amnesia: Secondary | ICD-10-CM | POA: Diagnosis not present

## 2016-10-14 DIAGNOSIS — F04 Amnestic disorder due to known physiological condition: Secondary | ICD-10-CM | POA: Diagnosis not present

## 2016-10-19 DIAGNOSIS — G454 Transient global amnesia: Secondary | ICD-10-CM | POA: Diagnosis not present

## 2016-10-19 DIAGNOSIS — I130 Hypertensive heart and chronic kidney disease with heart failure and stage 1 through stage 4 chronic kidney disease, or unspecified chronic kidney disease: Secondary | ICD-10-CM | POA: Diagnosis not present

## 2016-10-19 DIAGNOSIS — Z87898 Personal history of other specified conditions: Secondary | ICD-10-CM | POA: Diagnosis not present

## 2016-10-19 DIAGNOSIS — K219 Gastro-esophageal reflux disease without esophagitis: Secondary | ICD-10-CM | POA: Diagnosis not present

## 2016-10-31 DIAGNOSIS — G4733 Obstructive sleep apnea (adult) (pediatric): Secondary | ICD-10-CM | POA: Diagnosis not present

## 2016-11-01 DIAGNOSIS — R0600 Dyspnea, unspecified: Secondary | ICD-10-CM | POA: Diagnosis not present

## 2016-11-01 DIAGNOSIS — R918 Other nonspecific abnormal finding of lung field: Secondary | ICD-10-CM | POA: Diagnosis not present

## 2016-11-01 DIAGNOSIS — G4733 Obstructive sleep apnea (adult) (pediatric): Secondary | ICD-10-CM | POA: Diagnosis not present

## 2016-11-01 DIAGNOSIS — J301 Allergic rhinitis due to pollen: Secondary | ICD-10-CM | POA: Diagnosis not present

## 2016-11-01 DIAGNOSIS — G2581 Restless legs syndrome: Secondary | ICD-10-CM | POA: Diagnosis not present

## 2016-11-30 DIAGNOSIS — K219 Gastro-esophageal reflux disease without esophagitis: Secondary | ICD-10-CM | POA: Diagnosis not present

## 2016-11-30 DIAGNOSIS — G4730 Sleep apnea, unspecified: Secondary | ICD-10-CM | POA: Diagnosis not present

## 2016-11-30 DIAGNOSIS — R7309 Other abnormal glucose: Secondary | ICD-10-CM | POA: Diagnosis not present

## 2016-11-30 DIAGNOSIS — J449 Chronic obstructive pulmonary disease, unspecified: Secondary | ICD-10-CM | POA: Diagnosis not present

## 2016-11-30 DIAGNOSIS — N183 Chronic kidney disease, stage 3 (moderate): Secondary | ICD-10-CM | POA: Diagnosis not present

## 2016-11-30 DIAGNOSIS — E7800 Pure hypercholesterolemia, unspecified: Secondary | ICD-10-CM | POA: Diagnosis not present

## 2016-11-30 DIAGNOSIS — H9193 Unspecified hearing loss, bilateral: Secondary | ICD-10-CM | POA: Diagnosis not present

## 2016-11-30 DIAGNOSIS — Z8719 Personal history of other diseases of the digestive system: Secondary | ICD-10-CM | POA: Diagnosis not present

## 2016-11-30 DIAGNOSIS — J45909 Unspecified asthma, uncomplicated: Secondary | ICD-10-CM | POA: Diagnosis not present

## 2016-11-30 DIAGNOSIS — G2581 Restless legs syndrome: Secondary | ICD-10-CM | POA: Diagnosis not present

## 2016-11-30 DIAGNOSIS — N400 Enlarged prostate without lower urinary tract symptoms: Secondary | ICD-10-CM | POA: Diagnosis not present

## 2016-11-30 DIAGNOSIS — I130 Hypertensive heart and chronic kidney disease with heart failure and stage 1 through stage 4 chronic kidney disease, or unspecified chronic kidney disease: Secondary | ICD-10-CM | POA: Diagnosis not present

## 2016-12-04 DIAGNOSIS — R1013 Epigastric pain: Secondary | ICD-10-CM | POA: Diagnosis not present

## 2016-12-04 DIAGNOSIS — R1310 Dysphagia, unspecified: Secondary | ICD-10-CM | POA: Diagnosis not present

## 2016-12-04 DIAGNOSIS — K648 Other hemorrhoids: Secondary | ICD-10-CM | POA: Diagnosis not present

## 2016-12-07 DIAGNOSIS — Z961 Presence of intraocular lens: Secondary | ICD-10-CM | POA: Diagnosis not present

## 2016-12-07 DIAGNOSIS — H11153 Pinguecula, bilateral: Secondary | ICD-10-CM | POA: Diagnosis not present

## 2016-12-07 DIAGNOSIS — H18413 Arcus senilis, bilateral: Secondary | ICD-10-CM | POA: Diagnosis not present

## 2016-12-11 DIAGNOSIS — R1310 Dysphagia, unspecified: Secondary | ICD-10-CM | POA: Diagnosis not present

## 2016-12-19 DIAGNOSIS — Z1211 Encounter for screening for malignant neoplasm of colon: Secondary | ICD-10-CM | POA: Diagnosis not present

## 2016-12-19 DIAGNOSIS — N429 Disorder of prostate, unspecified: Secondary | ICD-10-CM | POA: Diagnosis not present

## 2016-12-19 DIAGNOSIS — Z86010 Personal history of colonic polyps: Secondary | ICD-10-CM | POA: Diagnosis not present

## 2016-12-19 DIAGNOSIS — Z79899 Other long term (current) drug therapy: Secondary | ICD-10-CM | POA: Diagnosis not present

## 2016-12-19 DIAGNOSIS — K5730 Diverticulosis of large intestine without perforation or abscess without bleeding: Secondary | ICD-10-CM | POA: Diagnosis not present

## 2016-12-19 DIAGNOSIS — I10 Essential (primary) hypertension: Secondary | ICD-10-CM | POA: Diagnosis not present

## 2016-12-19 DIAGNOSIS — D124 Benign neoplasm of descending colon: Secondary | ICD-10-CM | POA: Diagnosis not present

## 2016-12-19 DIAGNOSIS — Z85828 Personal history of other malignant neoplasm of skin: Secondary | ICD-10-CM | POA: Diagnosis not present

## 2016-12-19 DIAGNOSIS — J449 Chronic obstructive pulmonary disease, unspecified: Secondary | ICD-10-CM | POA: Diagnosis not present

## 2016-12-19 DIAGNOSIS — D126 Benign neoplasm of colon, unspecified: Secondary | ICD-10-CM | POA: Diagnosis not present

## 2016-12-19 DIAGNOSIS — K648 Other hemorrhoids: Secondary | ICD-10-CM | POA: Diagnosis not present

## 2017-01-03 DIAGNOSIS — M7541 Impingement syndrome of right shoulder: Secondary | ICD-10-CM | POA: Diagnosis not present

## 2017-01-29 DIAGNOSIS — G4733 Obstructive sleep apnea (adult) (pediatric): Secondary | ICD-10-CM | POA: Diagnosis not present

## 2017-01-31 DIAGNOSIS — J449 Chronic obstructive pulmonary disease, unspecified: Secondary | ICD-10-CM | POA: Diagnosis not present

## 2017-01-31 DIAGNOSIS — G4733 Obstructive sleep apnea (adult) (pediatric): Secondary | ICD-10-CM | POA: Diagnosis not present

## 2017-01-31 DIAGNOSIS — G2581 Restless legs syndrome: Secondary | ICD-10-CM | POA: Diagnosis not present

## 2017-01-31 DIAGNOSIS — R918 Other nonspecific abnormal finding of lung field: Secondary | ICD-10-CM | POA: Diagnosis not present

## 2017-01-31 DIAGNOSIS — F411 Generalized anxiety disorder: Secondary | ICD-10-CM | POA: Diagnosis not present

## 2017-03-12 DIAGNOSIS — G4700 Insomnia, unspecified: Secondary | ICD-10-CM | POA: Diagnosis not present

## 2017-03-12 DIAGNOSIS — N400 Enlarged prostate without lower urinary tract symptoms: Secondary | ICD-10-CM | POA: Diagnosis not present

## 2017-03-12 DIAGNOSIS — G2581 Restless legs syndrome: Secondary | ICD-10-CM | POA: Diagnosis not present

## 2017-03-12 DIAGNOSIS — K219 Gastro-esophageal reflux disease without esophagitis: Secondary | ICD-10-CM | POA: Diagnosis not present

## 2017-03-12 DIAGNOSIS — H9193 Unspecified hearing loss, bilateral: Secondary | ICD-10-CM | POA: Diagnosis not present

## 2017-03-12 DIAGNOSIS — I130 Hypertensive heart and chronic kidney disease with heart failure and stage 1 through stage 4 chronic kidney disease, or unspecified chronic kidney disease: Secondary | ICD-10-CM | POA: Diagnosis not present

## 2017-03-12 DIAGNOSIS — J45909 Unspecified asthma, uncomplicated: Secondary | ICD-10-CM | POA: Diagnosis not present

## 2017-03-12 DIAGNOSIS — Z23 Encounter for immunization: Secondary | ICD-10-CM | POA: Diagnosis not present

## 2017-03-12 DIAGNOSIS — N183 Chronic kidney disease, stage 3 (moderate): Secondary | ICD-10-CM | POA: Diagnosis not present

## 2017-03-12 DIAGNOSIS — E7800 Pure hypercholesterolemia, unspecified: Secondary | ICD-10-CM | POA: Diagnosis not present

## 2017-03-12 DIAGNOSIS — G4730 Sleep apnea, unspecified: Secondary | ICD-10-CM | POA: Diagnosis not present

## 2017-03-12 DIAGNOSIS — R42 Dizziness and giddiness: Secondary | ICD-10-CM | POA: Diagnosis not present

## 2017-03-13 DIAGNOSIS — G2581 Restless legs syndrome: Secondary | ICD-10-CM | POA: Diagnosis not present

## 2017-03-13 DIAGNOSIS — G4733 Obstructive sleep apnea (adult) (pediatric): Secondary | ICD-10-CM | POA: Diagnosis not present

## 2017-03-13 DIAGNOSIS — J441 Chronic obstructive pulmonary disease with (acute) exacerbation: Secondary | ICD-10-CM | POA: Diagnosis not present

## 2017-03-15 DIAGNOSIS — L570 Actinic keratosis: Secondary | ICD-10-CM | POA: Diagnosis not present

## 2017-03-15 DIAGNOSIS — D045 Carcinoma in situ of skin of trunk: Secondary | ICD-10-CM | POA: Diagnosis not present

## 2017-03-15 DIAGNOSIS — L578 Other skin changes due to chronic exposure to nonionizing radiation: Secondary | ICD-10-CM | POA: Diagnosis not present

## 2017-03-15 DIAGNOSIS — C44329 Squamous cell carcinoma of skin of other parts of face: Secondary | ICD-10-CM | POA: Diagnosis not present

## 2017-03-22 ENCOUNTER — Ambulatory Visit: Payer: No Typology Code available for payment source | Admitting: Neurology

## 2017-03-26 DIAGNOSIS — C44329 Squamous cell carcinoma of skin of other parts of face: Secondary | ICD-10-CM | POA: Diagnosis not present

## 2017-04-05 ENCOUNTER — Ambulatory Visit: Payer: Medicare Other | Admitting: Neurology

## 2017-04-23 DIAGNOSIS — G4733 Obstructive sleep apnea (adult) (pediatric): Secondary | ICD-10-CM | POA: Diagnosis not present

## 2017-04-23 DIAGNOSIS — J449 Chronic obstructive pulmonary disease, unspecified: Secondary | ICD-10-CM | POA: Diagnosis not present

## 2017-04-23 DIAGNOSIS — R918 Other nonspecific abnormal finding of lung field: Secondary | ICD-10-CM | POA: Diagnosis not present

## 2017-04-23 DIAGNOSIS — F411 Generalized anxiety disorder: Secondary | ICD-10-CM | POA: Diagnosis not present

## 2017-04-23 DIAGNOSIS — G2581 Restless legs syndrome: Secondary | ICD-10-CM | POA: Diagnosis not present

## 2017-04-30 DIAGNOSIS — H278 Other specified disorders of lens: Secondary | ICD-10-CM | POA: Diagnosis not present

## 2017-05-14 ENCOUNTER — Encounter: Payer: Self-pay | Admitting: *Deleted

## 2017-05-14 DIAGNOSIS — H26491 Other secondary cataract, right eye: Secondary | ICD-10-CM | POA: Diagnosis not present

## 2017-05-15 ENCOUNTER — Encounter: Payer: Self-pay | Admitting: Neurology

## 2017-05-15 ENCOUNTER — Ambulatory Visit (INDEPENDENT_AMBULATORY_CARE_PROVIDER_SITE_OTHER): Payer: Medicare Other | Admitting: Neurology

## 2017-05-15 VITALS — BP 171/100 | HR 60 | Ht 70.0 in | Wt 167.5 lb

## 2017-05-15 DIAGNOSIS — R413 Other amnesia: Secondary | ICD-10-CM | POA: Diagnosis not present

## 2017-05-15 DIAGNOSIS — R42 Dizziness and giddiness: Secondary | ICD-10-CM | POA: Diagnosis not present

## 2017-05-15 HISTORY — DX: Other amnesia: R41.3

## 2017-05-15 NOTE — Patient Instructions (Signed)
We will check a carotid doppler to look at the circulation to the head.

## 2017-05-15 NOTE — Progress Notes (Signed)
Reason for visit: Gait instability, memory problems  Referring physician: Dr. Jerene Lewis is a 83 y.o. male  History of present illness:  Mr. [name] is an 83 year old right-handed white male with a history of hypertension on blood pressure medications.  He has had a 1 year history of some intermittent episodes of gait instability.  He claims that normally he has no problems with balance, if he sits a long time and then stands up he may have a brief sensation of dizziness, he will have problems with being staggery with his walking, he may go to the right or to the left.  He has not had any falls, he has not lost consciousness, he does not report tunnel vision or visual dimming.  The patient reports that he does not get dizzy if he sits for short period of time and then stands up.  The patient takes his blood pressure on a regular basis at home, he generally will run in the 130-140 range.  He had an episode in June 2018 of transient global amnesia, MRI of the brain was done at [facility], the report is not available to me.  Apparently, no acute changes were noted.  The patient reports no numbness or weakness of the extremities, he does have a sensation of generalized fatigue.  He does report some low back pain at times, he denies any neck pain.  He denies issues controlling the bowels of the bladder.  He has reported a mild memory problem over the last 2 years, he is still able to keep up with medications, appointments, he is able to operate a motor vehicle without difficulty, he is able to balance the checkbook without difficulty.  The patient is sent to this office for further evaluation.  Some blood work has been done recently that includes a normal vitamin B12 level of 894, thyroid profile TSH was 2.395.  CBC and comprehensive metabolic profile was relatively unremarkable.  The patient does have restless leg syndrome that is currently treated with Requip.  Past Medical History:    Diagnosis Date  . Acne rosacea   . Asthma   . COPD (chronic obstructive pulmonary disease) ([place])   . GERD without esophagitis   . Hx of pancreatitis   . Hyperlipidemia   . 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 Memory loss   . Prostatic hyperplasia   . Restless leg syndrome   . Sleep apnea     Past Surgical History:  Procedure Laterality Date  . BACK SURGERY  1980  . high cholesterol    . SHOULDER SURGERY Left     Family History  Problem Relation Age of Onset  . CVA Mother   . Hypertension Mother   . Cancer - Prostate Father   . Cancer - Lung Brother   . Cancer - Lung Brother     Social history:  reports that he quit smoking about 38 years ago. His smoking use included cigarettes. he has never used smokeless tobacco. He reports that he does not drink alcohol or use drugs.  Medications:  Prior to Admission medications   Medication Sig Start Date End Date Taking? Authorizing Provider  albuterol (PROVENTIL HFA;VENTOLIN HFA) 108 (90 Base) MCG/ACT inhaler Inhale 2 puffs into the lungs every 6 (six) hours as needed for wheezing or shortness of breath.   Yes [provider]  carvedilol (COREG) 6.25 MG tablet Take 1 tablet by mouth  2 (two) times daily. 05/09/17  Yes [provider]  fluticasone furoate-vilanterol (BREO ELLIPTA) 200-25 MCG/INH AEPB Inhale 1 puff into the lungs daily.   Yes [provider]  losartan (COZAAR) 50 MG tablet Take 50 mg by mouth daily.    Yes [provider]  montelukast (SINGULAIR) 10 MG tablet Take 10 mg by mouth at bedtime.   Yes [provider]  omeprazole (PRILOSEC) 20 MG capsule Take 20 mg by mouth daily.   Yes [provider]  pravastatin (PRAVACHOL) 40 MG tablet Take 40 mg by mouth daily. 08/07/14  Yes [provider]  rOPINIRole (REQUIP) 1 MG tablet Take 1 mg by mouth at bedtime. TAKE 1 TO 3 HOURS BEFORE BED   Yes [provider]   tamsulosin (FLOMAX) 0.4 MG CAPS capsule Take 0.4 mg by mouth daily.    Yes [provider]  traZODone (DESYREL) 50 MG tablet Take 50 mg by mouth at bedtime.   Yes [provider]      Allergies  Allergen Reactions  . Codeine     SICK  . Penicillins     ROS:  Out of a complete 14 system review of symptoms, the patient complains only of the following symptoms, and all other reviewed systems are negative.  Hearing loss Moles Blurred vision Shortness of breath Constipation Easy bruising, easy bleeding Joint pain Dizziness Restless legs  Blood pressure (!) 171/100, pulse 60, height 5\' 10"  (1.778 m), weight 167 lb 8 oz (76 kg), SpO2 98 %.   Blood pressure, right arm, sitting is 218/100.  Blood pressure, right arm, standing is 163 systolic.  Physical Exam  General: The patient is alert and cooperative at the time of the examination.  Eyes: Pupils are equal, round, and reactive to light. Discs are flat bilaterally.  Neck: The neck is supple, no carotid bruits are noted.  Respiratory: The respiratory examination is clear.  Cardiovascular: The cardiovascular examination reveals a regular rate and rhythm, no obvious murmurs or rubs are noted.  Skin: Extremities are without significant edema.  Neurologic Exam  Mental status: The patient is alert and oriented x 3 at the time of the examination. The Mini-Mental status examination done today shows a total score 24/30.  Cranial nerves: Facial symmetry is present. There is good sensation of the face to pinprick and soft touch bilaterally. The strength of the facial muscles and the muscles to head turning and shoulder shrug are normal bilaterally. Speech is well enunciated, no aphasia or dysarthria is noted. Extraocular movements are full. Visual fields are full. The tongue is midline, and the patient has symmetric elevation of the soft palate. No obvious hearing deficits are noted.  Motor: The motor testing  reveals 5 over 5 strength of all 4 extremities. Good symmetric motor tone is noted throughout.  Sensory: Sensory testing is intact to pinprick, soft touch, vibration sensation, and position sense on all 4 extremities. No evidence of extinction is noted.  Coordination: Cerebellar testing reveals good finger-nose-finger and heel-to-shin bilaterally.  Gait and station: Gait is normal. Tandem gait is  minimally unsteady.  Romberg is negative. No drift is seen.  Reflexes: Deep tendon reflexes are symmetric and normal bilaterally, with exception of the ankle jerk reflexes are depressed. Toes are downgoing bilaterally.   Assessment/Plan:  1.  Episodic gait instability, dizziness  2.  Reported mild memory disorder  The patient has significant hypertension, he forgot his medications this morning prior to this evaluation, he has significant hypertension during  this examination.  The patient reports of brief episodes of dizziness when he stands after sitting long period of time, he likely has a problem with transient orthostatic hypotension.  The patient will check blood pressures sitting and standing at home and keep a record.  The patient will be set up for a carotid Doppler study.  We will follow the mild memory issues over time, he will follow-up in 4 months.  We will try to get the MRI brain report from [place] MD 05/15/2017 9:35 AM  [facility] [address] [place], [place] [address]  Phone [phone] Fax [phone]

## 2017-05-16 ENCOUNTER — Telehealth: Payer: Self-pay | Admitting: Neurology

## 2017-05-16 NOTE — Telephone Encounter (Signed)
I have received the results of the MRI of the brain that was done without contrast at [facility] on 14 Oct 2016.  This study shows no acute intracranial abnormalities, normal for age.  CT of the brain was done around the same time and was also normal.

## 2017-05-25 ENCOUNTER — Ambulatory Visit (HOSPITAL_COMMUNITY)
Admission: RE | Admit: 2017-05-25 | Discharge: 2017-05-25 | Disposition: A | Discharge status: Home / Self Care | Payer: Medicare Other | Source: Ambulatory Visit | Attending: Neurology | Admitting: Neurology

## 2017-05-25 ENCOUNTER — Ambulatory Visit (HOSPITAL_COMMUNITY): Payer: Medicare Other

## 2017-05-25 DIAGNOSIS — R42 Dizziness and giddiness: Secondary | ICD-10-CM | POA: Diagnosis not present

## 2017-05-25 NOTE — Progress Notes (Signed)
*  PRELIMINARY RESULTS* Vascular Ultrasound Carotid Duplex (Doppler) has been completed.  Preliminary findings: Bilateral 1-39% ICA stenosis, antegrade vertebral flow.   Everrett Coombe 05/25/2017, 2:51 PM

## 2017-05-27 ENCOUNTER — Telehealth: Payer: Self-pay | Admitting: Neurology

## 2017-05-27 NOTE — Telephone Encounter (Signed)
Try to call the patient, unable to leave a message.  We will call tomorrow.   Carotid doppler 05/27/17:  Final Interpretation: Right Carotid: There is evidence in the right ICA of a 1-39% stenosis.  Left Carotid: There is evidence in the left ICA of a 1-39% stenosis.  Vertebrals: Both vertebral arteries were patent with antegrade flow.

## 2017-05-28 DIAGNOSIS — H00012 Hordeolum externum right lower eyelid: Secondary | ICD-10-CM | POA: Diagnosis not present

## 2017-05-28 DIAGNOSIS — M7541 Impingement syndrome of right shoulder: Secondary | ICD-10-CM | POA: Diagnosis not present

## 2017-05-28 DIAGNOSIS — H00011 Hordeolum externum right upper eyelid: Secondary | ICD-10-CM | POA: Diagnosis not present

## 2017-05-28 NOTE — Telephone Encounter (Signed)
Called and spoke with patient about unremarkable carotid doppler study results per CW,MD. He verbalized understanding.

## 2017-06-13 DIAGNOSIS — J439 Emphysema, unspecified: Secondary | ICD-10-CM | POA: Diagnosis not present

## 2017-06-13 DIAGNOSIS — E7800 Pure hypercholesterolemia, unspecified: Secondary | ICD-10-CM | POA: Diagnosis not present

## 2017-06-13 DIAGNOSIS — J45909 Unspecified asthma, uncomplicated: Secondary | ICD-10-CM | POA: Diagnosis not present

## 2017-06-13 DIAGNOSIS — G2581 Restless legs syndrome: Secondary | ICD-10-CM | POA: Diagnosis not present

## 2017-06-13 DIAGNOSIS — G454 Transient global amnesia: Secondary | ICD-10-CM | POA: Diagnosis not present

## 2017-06-13 DIAGNOSIS — L719 Rosacea, unspecified: Secondary | ICD-10-CM | POA: Diagnosis not present

## 2017-06-13 DIAGNOSIS — I130 Hypertensive heart and chronic kidney disease with heart failure and stage 1 through stage 4 chronic kidney disease, or unspecified chronic kidney disease: Secondary | ICD-10-CM | POA: Diagnosis not present

## 2017-06-13 DIAGNOSIS — J449 Chronic obstructive pulmonary disease, unspecified: Secondary | ICD-10-CM | POA: Diagnosis not present

## 2017-06-13 DIAGNOSIS — N183 Chronic kidney disease, stage 3 (moderate): Secondary | ICD-10-CM | POA: Diagnosis not present

## 2017-06-13 DIAGNOSIS — K219 Gastro-esophageal reflux disease without esophagitis: Secondary | ICD-10-CM | POA: Diagnosis not present

## 2017-06-13 DIAGNOSIS — H9193 Unspecified hearing loss, bilateral: Secondary | ICD-10-CM | POA: Diagnosis not present

## 2017-06-13 DIAGNOSIS — N400 Enlarged prostate without lower urinary tract symptoms: Secondary | ICD-10-CM | POA: Diagnosis not present

## 2017-06-14 ENCOUNTER — Telehealth: Payer: Self-pay | Admitting: *Deleted

## 2017-06-14 NOTE — Telephone Encounter (Signed)
Faxed referral from Antony Contras, MD of [facility] sent to scheduling

## 2017-06-21 DIAGNOSIS — H00025 Hordeolum internum left lower eyelid: Secondary | ICD-10-CM | POA: Diagnosis not present

## 2017-06-29 DIAGNOSIS — G2581 Restless legs syndrome: Secondary | ICD-10-CM | POA: Diagnosis not present

## 2017-06-29 DIAGNOSIS — G4733 Obstructive sleep apnea (adult) (pediatric): Secondary | ICD-10-CM | POA: Diagnosis not present

## 2017-06-29 DIAGNOSIS — J449 Chronic obstructive pulmonary disease, unspecified: Secondary | ICD-10-CM | POA: Diagnosis not present

## 2017-06-29 DIAGNOSIS — R918 Other nonspecific abnormal finding of lung field: Secondary | ICD-10-CM | POA: Diagnosis not present

## 2017-07-10 ENCOUNTER — Encounter: Payer: Self-pay | Admitting: Cardiology

## 2017-07-10 ENCOUNTER — Ambulatory Visit (INDEPENDENT_AMBULATORY_CARE_PROVIDER_SITE_OTHER): Payer: Medicare Other | Admitting: Cardiology

## 2017-07-10 VITALS — BP 188/100 | HR 42 | Ht 69.5 in | Wt 168.8 lb

## 2017-07-10 DIAGNOSIS — J438 Other emphysema: Secondary | ICD-10-CM | POA: Diagnosis not present

## 2017-07-10 DIAGNOSIS — R0602 Shortness of breath: Secondary | ICD-10-CM

## 2017-07-10 DIAGNOSIS — I10 Essential (primary) hypertension: Secondary | ICD-10-CM

## 2017-07-10 NOTE — Patient Instructions (Signed)
Medication Instructions:  The current medical regimen is effective;  continue present plan and medications.  Labwork: None  Testing/Procedures: None  Follow-Up: Follow up as needed with Dr Marlou Porch.  Thank you for choosing [facility]!!

## 2017-07-10 NOTE — Addendum Note (Signed)
Addended by: Velna Ochs on: 07/10/2017 11:46 AM   Modules accepted: Orders

## 2017-07-10 NOTE — Progress Notes (Signed)
Cardiology Office Note    Date:  07/10/2017   ID:  [name], DOB 1932/09/30, MRN [id number]  PCP:  Antony Contras, MD  Cardiologist:   Candee Furbish, MD   No chief complaint on file.   History of Present Illness:  [name] is a 84 y.o. male here for follow up at the request of Dr. Antony Contras. Used to see a cardiologist in [place], Dr. Geraldo Pitter, last visit 12/27/15.  Prior visit: His main complaint is shortness of breath with minimal exertion. He has been diagnosed with fairly significant COPD, utilizes BiPAP at night with obstructive sleep apnea. Seems compliant with his medications. Former smoker for 50 years. Quit in 1980's.  In careful review of his records on epic, under care everywhere, I was able to find a cardiac catheterization report from 2016 which showed normal coronary arteries, normal left ventricular ejection fraction, normal left ventricular end-diastolic pressure of 9 mmHg. Reassuring. His blood pressure at that last visit was 110/68.  His son and wife state that he takes his losartan in the evening hours to try to help lower his blood pressure in the morning when he wakes up when it is usually the highest. By midafternoon it is usually low/normal. Sometimes if he gets down into the 110 range he may feel somewhat dizzy.  He is short of breath when walking to and from the mailbox. He was previously not interested in pulmonary rehabilitation in [place]. He does have a treadmill at home.  Creatinine 0.92, hemoglobin 15.3  Has a history of CHF. COPD. BiPAP. Some memory impairment.   07/10/17:  -Showed me some blood pressures at home which range from as high as 841 systolic down to 324 systolic.  Can fluctuate.  No significant symptoms.  No significant shortness of breath at this time.  He has had some trouble using his BiPAP recently.  He also asked if salt intake could make a difference.  Dr. Rockwell Germany has recently increased his losartan.   Past Medical History:    Diagnosis Date  . Acne rosacea   . Asthma   . COPD (chronic obstructive pulmonary disease) ([place])   . GERD without esophagitis   . Hx of pancreatitis   . Hyperlipidemia   . 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 Memory difficulty 05/15/2017  . Memory loss   . Prostatic hyperplasia   . Restless leg syndrome   . Sleep apnea     Past Surgical History:  Procedure Laterality Date  . BACK SURGERY  1980  . high cholesterol    . SHOULDER SURGERY Left     Current Medications: Outpatient Medications Prior to Visit  Medication Sig Dispense Refill  . albuterol (PROVENTIL HFA;VENTOLIN HFA) 108 (90 Base) MCG/ACT inhaler Inhale 2 puffs into the lungs every 6 (six) hours as needed for wheezing or shortness of breath.    . carvedilol (COREG) 6.25 MG tablet Take 1 tablet by mouth 2 (two) times daily.    Marland Kitchen losartan (COZAAR) 100 MG tablet Take 100 mg by mouth daily.    . montelukast (SINGULAIR) 10 MG tablet Take 10 mg by mouth at bedtime.    Marland Kitchen omeprazole (PRILOSEC) 20 MG capsule Take 20 mg by mouth daily as needed (heartburn).     . pramipexole (MIRAPEX) 0.5 MG tablet Take 0.5 mg by mouth at bedtime.    . pravastatin (PRAVACHOL) 40 MG tablet Take 40 mg by mouth daily.    Marland Kitchen  rOPINIRole (REQUIP) 1 MG tablet Take 1 mg by mouth at bedtime. TAKE 1 TO 3 HOURS BEFORE BED    . traZODone (DESYREL) 50 MG tablet Take 50 mg by mouth at bedtime.    . fluticasone furoate-vilanterol (BREO ELLIPTA) 200-25 MCG/INH AEPB Inhale 1 puff into the lungs daily.    Marland Kitchen losartan (COZAAR) 50 MG tablet Take 50 mg by mouth daily.     . tamsulosin (FLOMAX) 0.4 MG CAPS capsule Take 0.4 mg by mouth daily.      No facility-administered medications prior to visit.      Allergies:   Codeine and Penicillins   Social History   Socioeconomic History  . Marital status: Married    Spouse name: None  . Number of children: 2  . Years of education: 55  . Highest education level: None  Social  Needs  . Financial resource strain: None  . Food insecurity - worry: None  . Food insecurity - inability: None  . Transportation needs - medical: None  . Transportation needs - non-medical: None  Occupational History  . None  Tobacco Use  . Smoking status: Former Smoker    Types: Cigarettes    Last attempt to quit: 07/10/1978    Years since quitting: 39.0  . Smokeless tobacco: Never Used  Substance and Sexual Activity  . Alcohol use: No    Frequency: Never  . Drug use: No  . Sexual activity: No  Other Topics Concern  . None  Social History Narrative      Caffeine use: coffee/coke daily   Right handed      Family History:  The patient's family history includes CVA in his mother; Cancer - Lung in his brother and brother; Cancer - Prostate in his father; Hypertension in his mother.   ROS:   Please see the history of present illness.    Review of Systems  All other systems reviewed and are negative.     PHYSICAL EXAM:   VS:  BP (!) 188/100   Pulse (!) 42   Ht 5' 9.5" (1.765 m)   Wt 168 lb 12.8 oz (76.6 kg)   BMI 24.57 kg/m   Pulse was 60 on recheck GEN: Well nourished, well developed, in no acute distress  HEENT: normal  Neck: no JVD, carotid bruits, or masses Cardiac: RRR; no murmurs, rubs, or gallops,no edema  Respiratory:  clear to auscultation bilaterally, normal work of breathing GI: soft, nontender, nondistended, + BS MS: no deformity or atrophy  Skin: warm and dry, no rash Neuro:  Alert and Oriented x 3, Strength and sensation are intact Psych: euthymic mood, full affect  Wt Readings from Last 3 Encounters:  07/10/17 168 lb 12.8 oz (76.6 kg)  05/15/17 167 lb 8 oz (76 kg)  07/12/16 173 lb 1.9 oz (78.5 kg)      Studies/Labs Reviewed:   EKG:  EKG from 07/12/16-sinus rhythm, left axis deviation, heart rate 80 bpm, old inferior infarct pattern (no signs of infarction on cardiac catheterization)  Recent Labs: No results found for requested labs within last  8760 hours.   Lipid Panel No results found for: CHOL, TRIG, HDL, CHOLHDL, VLDL, LDLCALC, LDLDIRECT  Additional studies/ records that were reviewed today include:   Cardiac catheterization 05/28/15-normal coronary arteries, normal ejection fraction, normal left ventricular end-diastolic pressure 9.  ECHO: 07/28/16  - Left ventricle: The cavity size was normal. There was moderate   concentric hypertrophy. Systolic function was vigorous. The   estimated ejection fraction  was in the range of 65% to 70%. Wall   motion was normal; there were no regional wall motion   abnormalities. Doppler parameters are consistent with abnormal   left ventricular relaxation (grade 1 diastolic dysfunction).   There was no evidence of elevated ventricular filling pressure by   Doppler parameters. - Aortic valve: There was mild regurgitation. - Ascending aorta: The ascending aorta was normal in size. - Left atrium: The atrium was normal in size. - Right ventricle: Systolic function was normal. - Tricuspid valve: There was trivial regurgitation. - Pulmonary arteries: Systolic pressure was within the normal   range. - Inferior vena cava: The vessel was normal in size. - Pericardium, extracardiac: There was no pericardial effusion.  ASSESSMENT:    1. Essential hypertension   2. SOB (shortness of breath)   3. Other emphysema (HCC)      PLAN:  In order of problems listed above:  Dyspnea on exertion  - Majority of his symptoms are related to his underlying COPD.  - Cardiac catheterization was quite reassuring. Normal EF. No evidence of systolic heart failure. In fact, his left ventricular end-diastolic pressure was normal suggesting no evidence of diastolic heart failure as well.  - ECHO also normal (see above).  - I also explained to him that sometimes when people are short of breath from a pulmonary perspective, and there echocardiogram demonstrates diastolic dysfunction which is fairly normal given  his advanced age, that patient sometimes get labeled with diastolic heart failure. Elevated blood pressure can also exacerbate this as well. Overall, treatment of blood pressure and COPD is goal.  Reassurance has been given.  COPD  - Continued medication use.  - Continue to encourage daily walking. He has a treadmill at home.  Essential hypertension/labile  - Agree with Dr. Laqueta Linden management-losartan has recently been increased to 100.  - Decrease salt intake-he does admit to eating potato chips occasionally.  Continue to advocate dietary modification.  -Agree with carvedilol use.  Seems to have helped his palpitations as well.  -Could consider spironolactone 12.5 mg once a day if blood pressure is still an issue.  Just make sure that basic metabolic profile and potassium is being monitored in the setting.  As needed follow-up.  I do not feel strongly that he needs to come back on an annual basis. Please let me know if I can be of further assistance. Once again reassuring cardiac catheterization.  Medication Adjustments/Labs and Tests Ordered: Current medicines are reviewed at length with the patient today.  Concerns regarding medicines are outlined above.  Medication changes, Labs and Tests ordered today are listed in the Patient Instructions below. Patient Instructions  Medication Instructions:  The current medical regimen is effective;  continue present plan and medications.  Labwork: None  Testing/Procedures: None  Follow-Up: Follow up as needed with Dr Marlou Porch.  Thank you for choosing [facility]!!        Signed, Candee Furbish, MD  07/10/2017 10:25 AM    [address], [place], [place]  [address] Phone: [phone]; Fax: [phone]

## 2017-07-27 DIAGNOSIS — I130 Hypertensive heart and chronic kidney disease with heart failure and stage 1 through stage 4 chronic kidney disease, or unspecified chronic kidney disease: Secondary | ICD-10-CM | POA: Diagnosis not present

## 2017-07-27 DIAGNOSIS — R5383 Other fatigue: Secondary | ICD-10-CM | POA: Diagnosis not present

## 2017-07-27 DIAGNOSIS — R109 Unspecified abdominal pain: Secondary | ICD-10-CM | POA: Diagnosis not present

## 2017-07-27 DIAGNOSIS — G4730 Sleep apnea, unspecified: Secondary | ICD-10-CM | POA: Diagnosis not present

## 2017-07-31 DIAGNOSIS — N138 Other obstructive and reflux uropathy: Secondary | ICD-10-CM | POA: Diagnosis not present

## 2017-07-31 DIAGNOSIS — N401 Enlarged prostate with lower urinary tract symptoms: Secondary | ICD-10-CM | POA: Diagnosis not present

## 2017-07-31 DIAGNOSIS — N411 Chronic prostatitis: Secondary | ICD-10-CM | POA: Diagnosis not present

## 2017-08-03 DIAGNOSIS — G4733 Obstructive sleep apnea (adult) (pediatric): Secondary | ICD-10-CM | POA: Diagnosis not present

## 2017-08-06 DIAGNOSIS — G2581 Restless legs syndrome: Secondary | ICD-10-CM | POA: Diagnosis not present

## 2017-08-06 DIAGNOSIS — G4733 Obstructive sleep apnea (adult) (pediatric): Secondary | ICD-10-CM | POA: Diagnosis not present

## 2017-08-06 DIAGNOSIS — J449 Chronic obstructive pulmonary disease, unspecified: Secondary | ICD-10-CM | POA: Diagnosis not present

## 2017-08-06 DIAGNOSIS — F411 Generalized anxiety disorder: Secondary | ICD-10-CM | POA: Diagnosis not present

## 2017-08-09 DIAGNOSIS — L570 Actinic keratosis: Secondary | ICD-10-CM | POA: Diagnosis not present

## 2017-08-20 DIAGNOSIS — N138 Other obstructive and reflux uropathy: Secondary | ICD-10-CM | POA: Diagnosis not present

## 2017-08-20 DIAGNOSIS — N401 Enlarged prostate with lower urinary tract symptoms: Secondary | ICD-10-CM | POA: Diagnosis not present

## 2017-08-21 DIAGNOSIS — J449 Chronic obstructive pulmonary disease, unspecified: Secondary | ICD-10-CM | POA: Diagnosis not present

## 2017-08-21 DIAGNOSIS — G4733 Obstructive sleep apnea (adult) (pediatric): Secondary | ICD-10-CM | POA: Diagnosis not present

## 2017-08-21 DIAGNOSIS — F411 Generalized anxiety disorder: Secondary | ICD-10-CM | POA: Diagnosis not present

## 2017-08-28 DIAGNOSIS — N529 Male erectile dysfunction, unspecified: Secondary | ICD-10-CM | POA: Diagnosis not present

## 2017-08-28 DIAGNOSIS — N138 Other obstructive and reflux uropathy: Secondary | ICD-10-CM | POA: Diagnosis not present

## 2017-08-28 DIAGNOSIS — N401 Enlarged prostate with lower urinary tract symptoms: Secondary | ICD-10-CM | POA: Diagnosis not present

## 2017-08-28 DIAGNOSIS — N411 Chronic prostatitis: Secondary | ICD-10-CM | POA: Diagnosis not present

## 2017-09-13 ENCOUNTER — Ambulatory Visit: Payer: Medicare Other | Admitting: Neurology

## 2017-10-17 DIAGNOSIS — J4530 Mild persistent asthma, uncomplicated: Secondary | ICD-10-CM | POA: Diagnosis not present

## 2017-10-17 DIAGNOSIS — G2581 Restless legs syndrome: Secondary | ICD-10-CM | POA: Diagnosis not present

## 2017-10-17 DIAGNOSIS — F411 Generalized anxiety disorder: Secondary | ICD-10-CM | POA: Diagnosis not present

## 2017-10-17 DIAGNOSIS — G4733 Obstructive sleep apnea (adult) (pediatric): Secondary | ICD-10-CM | POA: Diagnosis not present

## 2017-11-01 DIAGNOSIS — M1990 Unspecified osteoarthritis, unspecified site: Secondary | ICD-10-CM | POA: Diagnosis not present

## 2017-11-09 DIAGNOSIS — M2550 Pain in unspecified joint: Secondary | ICD-10-CM | POA: Diagnosis not present

## 2017-11-09 DIAGNOSIS — E7800 Pure hypercholesterolemia, unspecified: Secondary | ICD-10-CM | POA: Diagnosis not present

## 2017-11-09 DIAGNOSIS — I130 Hypertensive heart and chronic kidney disease with heart failure and stage 1 through stage 4 chronic kidney disease, or unspecified chronic kidney disease: Secondary | ICD-10-CM | POA: Diagnosis not present

## 2017-11-23 DIAGNOSIS — G4733 Obstructive sleep apnea (adult) (pediatric): Secondary | ICD-10-CM | POA: Diagnosis not present

## 2017-11-23 DIAGNOSIS — J301 Allergic rhinitis due to pollen: Secondary | ICD-10-CM | POA: Diagnosis not present

## 2017-11-23 DIAGNOSIS — G2581 Restless legs syndrome: Secondary | ICD-10-CM | POA: Diagnosis not present

## 2017-11-23 DIAGNOSIS — J4531 Mild persistent asthma with (acute) exacerbation: Secondary | ICD-10-CM | POA: Diagnosis not present

## 2017-12-10 DIAGNOSIS — I130 Hypertensive heart and chronic kidney disease with heart failure and stage 1 through stage 4 chronic kidney disease, or unspecified chronic kidney disease: Secondary | ICD-10-CM | POA: Diagnosis not present

## 2017-12-10 DIAGNOSIS — M2550 Pain in unspecified joint: Secondary | ICD-10-CM | POA: Diagnosis not present

## 2017-12-10 DIAGNOSIS — E7800 Pure hypercholesterolemia, unspecified: Secondary | ICD-10-CM | POA: Diagnosis not present

## 2017-12-27 DIAGNOSIS — H11153 Pinguecula, bilateral: Secondary | ICD-10-CM | POA: Diagnosis not present

## 2017-12-27 DIAGNOSIS — H25812 Combined forms of age-related cataract, left eye: Secondary | ICD-10-CM | POA: Diagnosis not present

## 2017-12-27 DIAGNOSIS — H18413 Arcus senilis, bilateral: Secondary | ICD-10-CM | POA: Diagnosis not present

## 2018-01-01 DIAGNOSIS — G4733 Obstructive sleep apnea (adult) (pediatric): Secondary | ICD-10-CM | POA: Diagnosis not present

## 2018-01-01 DIAGNOSIS — G2581 Restless legs syndrome: Secondary | ICD-10-CM | POA: Diagnosis not present

## 2018-01-01 DIAGNOSIS — J301 Allergic rhinitis due to pollen: Secondary | ICD-10-CM | POA: Diagnosis not present

## 2018-01-01 DIAGNOSIS — J4530 Mild persistent asthma, uncomplicated: Secondary | ICD-10-CM | POA: Diagnosis not present

## 2018-01-31 DIAGNOSIS — M79641 Pain in right hand: Secondary | ICD-10-CM | POA: Diagnosis not present

## 2018-01-31 DIAGNOSIS — M180 Bilateral primary osteoarthritis of first carpometacarpal joints: Secondary | ICD-10-CM | POA: Diagnosis not present

## 2018-01-31 DIAGNOSIS — M810 Age-related osteoporosis without current pathological fracture: Secondary | ICD-10-CM | POA: Diagnosis not present

## 2018-01-31 DIAGNOSIS — M064 Inflammatory polyarthropathy: Secondary | ICD-10-CM | POA: Diagnosis not present

## 2018-01-31 DIAGNOSIS — M7989 Other specified soft tissue disorders: Secondary | ICD-10-CM | POA: Diagnosis not present

## 2018-01-31 DIAGNOSIS — M79642 Pain in left hand: Secondary | ICD-10-CM | POA: Diagnosis not present

## 2018-01-31 DIAGNOSIS — M1990 Unspecified osteoarthritis, unspecified site: Secondary | ICD-10-CM | POA: Diagnosis not present

## 2018-01-31 DIAGNOSIS — M79643 Pain in unspecified hand: Secondary | ICD-10-CM | POA: Diagnosis not present

## 2018-02-21 DIAGNOSIS — M7989 Other specified soft tissue disorders: Secondary | ICD-10-CM | POA: Diagnosis not present

## 2018-02-21 DIAGNOSIS — M6530 Trigger finger, unspecified finger: Secondary | ICD-10-CM | POA: Diagnosis not present

## 2018-02-21 DIAGNOSIS — M79643 Pain in unspecified hand: Secondary | ICD-10-CM | POA: Diagnosis not present

## 2018-02-21 DIAGNOSIS — M1990 Unspecified osteoarthritis, unspecified site: Secondary | ICD-10-CM | POA: Diagnosis not present

## 2018-02-21 DIAGNOSIS — M064 Inflammatory polyarthropathy: Secondary | ICD-10-CM | POA: Diagnosis not present

## 2018-02-21 DIAGNOSIS — M810 Age-related osteoporosis without current pathological fracture: Secondary | ICD-10-CM | POA: Diagnosis not present

## 2018-02-21 DIAGNOSIS — M353 Polymyalgia rheumatica: Secondary | ICD-10-CM | POA: Diagnosis not present

## 2018-02-26 DIAGNOSIS — H26491 Other secondary cataract, right eye: Secondary | ICD-10-CM | POA: Diagnosis not present

## 2018-02-26 DIAGNOSIS — H25812 Combined forms of age-related cataract, left eye: Secondary | ICD-10-CM | POA: Diagnosis not present

## 2018-02-26 DIAGNOSIS — Z01818 Encounter for other preprocedural examination: Secondary | ICD-10-CM | POA: Diagnosis not present

## 2018-03-05 DIAGNOSIS — H25812 Combined forms of age-related cataract, left eye: Secondary | ICD-10-CM | POA: Diagnosis not present

## 2018-03-05 DIAGNOSIS — E785 Hyperlipidemia, unspecified: Secondary | ICD-10-CM | POA: Diagnosis not present

## 2018-03-05 DIAGNOSIS — I10 Essential (primary) hypertension: Secondary | ICD-10-CM | POA: Diagnosis not present

## 2018-03-05 DIAGNOSIS — K219 Gastro-esophageal reflux disease without esophagitis: Secondary | ICD-10-CM | POA: Diagnosis not present

## 2018-03-05 DIAGNOSIS — Z7952 Long term (current) use of systemic steroids: Secondary | ICD-10-CM | POA: Diagnosis not present

## 2018-03-05 DIAGNOSIS — M549 Dorsalgia, unspecified: Secondary | ICD-10-CM | POA: Diagnosis not present

## 2018-03-05 DIAGNOSIS — H259 Unspecified age-related cataract: Secondary | ICD-10-CM | POA: Diagnosis not present

## 2018-03-05 DIAGNOSIS — G510 Bell's palsy: Secondary | ICD-10-CM | POA: Diagnosis not present

## 2018-03-05 DIAGNOSIS — N400 Enlarged prostate without lower urinary tract symptoms: Secondary | ICD-10-CM | POA: Diagnosis not present

## 2018-03-05 DIAGNOSIS — G4733 Obstructive sleep apnea (adult) (pediatric): Secondary | ICD-10-CM | POA: Diagnosis not present

## 2018-03-05 DIAGNOSIS — Z87891 Personal history of nicotine dependence: Secondary | ICD-10-CM | POA: Diagnosis not present

## 2018-03-05 DIAGNOSIS — J449 Chronic obstructive pulmonary disease, unspecified: Secondary | ICD-10-CM | POA: Diagnosis not present

## 2018-03-05 DIAGNOSIS — Z79899 Other long term (current) drug therapy: Secondary | ICD-10-CM | POA: Diagnosis not present

## 2018-03-12 DIAGNOSIS — M810 Age-related osteoporosis without current pathological fracture: Secondary | ICD-10-CM | POA: Diagnosis not present

## 2018-03-12 DIAGNOSIS — Z23 Encounter for immunization: Secondary | ICD-10-CM | POA: Diagnosis not present

## 2018-03-12 DIAGNOSIS — G4700 Insomnia, unspecified: Secondary | ICD-10-CM | POA: Diagnosis not present

## 2018-03-12 DIAGNOSIS — I130 Hypertensive heart and chronic kidney disease with heart failure and stage 1 through stage 4 chronic kidney disease, or unspecified chronic kidney disease: Secondary | ICD-10-CM | POA: Diagnosis not present

## 2018-03-12 DIAGNOSIS — G2581 Restless legs syndrome: Secondary | ICD-10-CM | POA: Diagnosis not present

## 2018-03-12 DIAGNOSIS — N183 Chronic kidney disease, stage 3 (moderate): Secondary | ICD-10-CM | POA: Diagnosis not present

## 2018-03-12 DIAGNOSIS — J45909 Unspecified asthma, uncomplicated: Secondary | ICD-10-CM | POA: Diagnosis not present

## 2018-03-12 DIAGNOSIS — G4730 Sleep apnea, unspecified: Secondary | ICD-10-CM | POA: Diagnosis not present

## 2018-03-12 DIAGNOSIS — E7800 Pure hypercholesterolemia, unspecified: Secondary | ICD-10-CM | POA: Diagnosis not present

## 2018-03-12 DIAGNOSIS — H9193 Unspecified hearing loss, bilateral: Secondary | ICD-10-CM | POA: Diagnosis not present

## 2018-03-12 DIAGNOSIS — N400 Enlarged prostate without lower urinary tract symptoms: Secondary | ICD-10-CM | POA: Diagnosis not present

## 2018-03-27 DIAGNOSIS — Z961 Presence of intraocular lens: Secondary | ICD-10-CM | POA: Diagnosis not present

## 2018-04-13 DIAGNOSIS — L820 Inflamed seborrheic keratosis: Secondary | ICD-10-CM | POA: Diagnosis not present

## 2018-04-16 DIAGNOSIS — G2581 Restless legs syndrome: Secondary | ICD-10-CM | POA: Diagnosis not present

## 2018-04-16 DIAGNOSIS — G4733 Obstructive sleep apnea (adult) (pediatric): Secondary | ICD-10-CM | POA: Diagnosis not present

## 2018-04-16 DIAGNOSIS — J4520 Mild intermittent asthma, uncomplicated: Secondary | ICD-10-CM | POA: Diagnosis not present

## 2018-04-16 DIAGNOSIS — J301 Allergic rhinitis due to pollen: Secondary | ICD-10-CM | POA: Diagnosis not present

## 2018-04-18 DIAGNOSIS — M7989 Other specified soft tissue disorders: Secondary | ICD-10-CM | POA: Diagnosis not present

## 2018-04-18 DIAGNOSIS — M1990 Unspecified osteoarthritis, unspecified site: Secondary | ICD-10-CM | POA: Diagnosis not present

## 2018-04-18 DIAGNOSIS — M6530 Trigger finger, unspecified finger: Secondary | ICD-10-CM | POA: Diagnosis not present

## 2018-04-18 DIAGNOSIS — M353 Polymyalgia rheumatica: Secondary | ICD-10-CM | POA: Diagnosis not present

## 2018-04-18 DIAGNOSIS — M810 Age-related osteoporosis without current pathological fracture: Secondary | ICD-10-CM | POA: Diagnosis not present

## 2018-04-18 DIAGNOSIS — M79643 Pain in unspecified hand: Secondary | ICD-10-CM | POA: Diagnosis not present

## 2018-04-18 DIAGNOSIS — M064 Inflammatory polyarthropathy: Secondary | ICD-10-CM | POA: Diagnosis not present

## 2018-05-22 DIAGNOSIS — N529 Male erectile dysfunction, unspecified: Secondary | ICD-10-CM | POA: Diagnosis not present

## 2018-05-22 DIAGNOSIS — N401 Enlarged prostate with lower urinary tract symptoms: Secondary | ICD-10-CM | POA: Diagnosis not present

## 2018-05-22 DIAGNOSIS — N138 Other obstructive and reflux uropathy: Secondary | ICD-10-CM | POA: Diagnosis not present

## 2018-05-23 DIAGNOSIS — M810 Age-related osteoporosis without current pathological fracture: Secondary | ICD-10-CM | POA: Diagnosis not present

## 2018-06-07 DIAGNOSIS — R933 Abnormal findings on diagnostic imaging of other parts of digestive tract: Secondary | ICD-10-CM | POA: Diagnosis not present

## 2018-06-07 DIAGNOSIS — Z01812 Encounter for preprocedural laboratory examination: Secondary | ICD-10-CM | POA: Diagnosis not present

## 2018-06-07 DIAGNOSIS — K7689 Other specified diseases of liver: Secondary | ICD-10-CM | POA: Diagnosis not present

## 2018-06-07 DIAGNOSIS — D136 Benign neoplasm of pancreas: Secondary | ICD-10-CM | POA: Diagnosis not present

## 2018-06-18 DIAGNOSIS — J45909 Unspecified asthma, uncomplicated: Secondary | ICD-10-CM | POA: Diagnosis not present

## 2018-06-18 DIAGNOSIS — J439 Emphysema, unspecified: Secondary | ICD-10-CM | POA: Diagnosis not present

## 2018-06-18 DIAGNOSIS — E7800 Pure hypercholesterolemia, unspecified: Secondary | ICD-10-CM | POA: Diagnosis not present

## 2018-06-18 DIAGNOSIS — N183 Chronic kidney disease, stage 3 (moderate): Secondary | ICD-10-CM | POA: Diagnosis not present

## 2018-06-18 DIAGNOSIS — M810 Age-related osteoporosis without current pathological fracture: Secondary | ICD-10-CM | POA: Diagnosis not present

## 2018-06-18 DIAGNOSIS — G454 Transient global amnesia: Secondary | ICD-10-CM | POA: Diagnosis not present

## 2018-06-18 DIAGNOSIS — I130 Hypertensive heart and chronic kidney disease with heart failure and stage 1 through stage 4 chronic kidney disease, or unspecified chronic kidney disease: Secondary | ICD-10-CM | POA: Diagnosis not present

## 2018-06-18 DIAGNOSIS — G4700 Insomnia, unspecified: Secondary | ICD-10-CM | POA: Diagnosis not present

## 2018-06-18 DIAGNOSIS — N62 Hypertrophy of breast: Secondary | ICD-10-CM | POA: Diagnosis not present

## 2018-06-18 DIAGNOSIS — J449 Chronic obstructive pulmonary disease, unspecified: Secondary | ICD-10-CM | POA: Diagnosis not present

## 2018-06-18 DIAGNOSIS — G4730 Sleep apnea, unspecified: Secondary | ICD-10-CM | POA: Diagnosis not present

## 2018-06-18 DIAGNOSIS — H9193 Unspecified hearing loss, bilateral: Secondary | ICD-10-CM | POA: Diagnosis not present

## 2018-06-20 DIAGNOSIS — R1013 Epigastric pain: Secondary | ICD-10-CM | POA: Diagnosis not present

## 2018-06-20 DIAGNOSIS — K219 Gastro-esophageal reflux disease without esophagitis: Secondary | ICD-10-CM | POA: Diagnosis not present

## 2018-06-20 DIAGNOSIS — K5900 Constipation, unspecified: Secondary | ICD-10-CM | POA: Diagnosis not present

## 2018-06-25 DIAGNOSIS — J301 Allergic rhinitis due to pollen: Secondary | ICD-10-CM | POA: Diagnosis not present

## 2018-06-25 DIAGNOSIS — J4520 Mild intermittent asthma, uncomplicated: Secondary | ICD-10-CM | POA: Diagnosis not present

## 2018-06-25 DIAGNOSIS — G2581 Restless legs syndrome: Secondary | ICD-10-CM | POA: Diagnosis not present

## 2018-06-25 DIAGNOSIS — G4733 Obstructive sleep apnea (adult) (pediatric): Secondary | ICD-10-CM | POA: Diagnosis not present

## 2018-07-29 DIAGNOSIS — M25462 Effusion, left knee: Secondary | ICD-10-CM | POA: Diagnosis not present

## 2018-07-29 DIAGNOSIS — M7989 Other specified soft tissue disorders: Secondary | ICD-10-CM | POA: Diagnosis not present

## 2018-07-29 DIAGNOSIS — M25569 Pain in unspecified knee: Secondary | ICD-10-CM | POA: Diagnosis not present

## 2018-07-29 DIAGNOSIS — M25561 Pain in right knee: Secondary | ICD-10-CM | POA: Diagnosis not present

## 2018-07-29 DIAGNOSIS — M79643 Pain in unspecified hand: Secondary | ICD-10-CM | POA: Diagnosis not present

## 2018-07-29 DIAGNOSIS — M6530 Trigger finger, unspecified finger: Secondary | ICD-10-CM | POA: Diagnosis not present

## 2018-07-29 DIAGNOSIS — M1990 Unspecified osteoarthritis, unspecified site: Secondary | ICD-10-CM | POA: Diagnosis not present

## 2018-07-29 DIAGNOSIS — M810 Age-related osteoporosis without current pathological fracture: Secondary | ICD-10-CM | POA: Diagnosis not present

## 2018-07-29 DIAGNOSIS — M353 Polymyalgia rheumatica: Secondary | ICD-10-CM | POA: Diagnosis not present

## 2018-07-29 DIAGNOSIS — M064 Inflammatory polyarthropathy: Secondary | ICD-10-CM | POA: Diagnosis not present

## 2018-10-01 DIAGNOSIS — J209 Acute bronchitis, unspecified: Secondary | ICD-10-CM | POA: Diagnosis not present

## 2018-10-01 DIAGNOSIS — J4520 Mild intermittent asthma, uncomplicated: Secondary | ICD-10-CM | POA: Diagnosis not present

## 2018-10-01 DIAGNOSIS — R0600 Dyspnea, unspecified: Secondary | ICD-10-CM | POA: Diagnosis not present

## 2018-10-01 DIAGNOSIS — G2581 Restless legs syndrome: Secondary | ICD-10-CM | POA: Diagnosis not present

## 2018-10-01 DIAGNOSIS — J9621 Acute and chronic respiratory failure with hypoxia: Secondary | ICD-10-CM | POA: Diagnosis not present

## 2018-10-01 DIAGNOSIS — J301 Allergic rhinitis due to pollen: Secondary | ICD-10-CM | POA: Diagnosis not present

## 2018-10-01 DIAGNOSIS — G4733 Obstructive sleep apnea (adult) (pediatric): Secondary | ICD-10-CM | POA: Diagnosis not present

## 2018-10-01 DIAGNOSIS — R05 Cough: Secondary | ICD-10-CM | POA: Diagnosis not present

## 2018-10-02 DIAGNOSIS — G2581 Restless legs syndrome: Secondary | ICD-10-CM | POA: Diagnosis not present

## 2018-10-02 DIAGNOSIS — J301 Allergic rhinitis due to pollen: Secondary | ICD-10-CM | POA: Diagnosis not present

## 2018-10-02 DIAGNOSIS — R0600 Dyspnea, unspecified: Secondary | ICD-10-CM | POA: Diagnosis not present

## 2018-10-02 DIAGNOSIS — J9621 Acute and chronic respiratory failure with hypoxia: Secondary | ICD-10-CM | POA: Diagnosis not present

## 2018-10-02 DIAGNOSIS — J4520 Mild intermittent asthma, uncomplicated: Secondary | ICD-10-CM | POA: Diagnosis not present

## 2018-10-02 DIAGNOSIS — J209 Acute bronchitis, unspecified: Secondary | ICD-10-CM | POA: Diagnosis not present

## 2018-10-02 DIAGNOSIS — R05 Cough: Secondary | ICD-10-CM | POA: Diagnosis not present

## 2018-10-03 DIAGNOSIS — J209 Acute bronchitis, unspecified: Secondary | ICD-10-CM | POA: Diagnosis not present

## 2018-10-03 DIAGNOSIS — G2581 Restless legs syndrome: Secondary | ICD-10-CM | POA: Diagnosis not present

## 2018-10-03 DIAGNOSIS — J4520 Mild intermittent asthma, uncomplicated: Secondary | ICD-10-CM | POA: Diagnosis not present

## 2018-10-03 DIAGNOSIS — R0600 Dyspnea, unspecified: Secondary | ICD-10-CM | POA: Diagnosis not present

## 2018-10-03 DIAGNOSIS — J9621 Acute and chronic respiratory failure with hypoxia: Secondary | ICD-10-CM | POA: Diagnosis not present

## 2018-10-03 DIAGNOSIS — R05 Cough: Secondary | ICD-10-CM | POA: Diagnosis not present

## 2018-10-03 DIAGNOSIS — J301 Allergic rhinitis due to pollen: Secondary | ICD-10-CM | POA: Diagnosis not present

## 2018-10-07 ENCOUNTER — Telehealth: Payer: Self-pay | Admitting: Cardiology

## 2018-10-07 NOTE — Telephone Encounter (Signed)
Virtual Visit Pre-Appointment Phone Call  "(Name), I am calling you today to discuss your upcoming appointment. We are currently trying to limit exposure to the virus that causes COVID-19 by seeing patients at home rather than in the office."  1. "What is the BEST phone number to call the day of the visit?" - include this in appointment notes  2. Do you have or have access to (through a family member/friend) a smartphone with video capability that we can use for your visit?" a. If yes - list this number in appt notes as cell (if different from BEST phone #) and list the appointment type as a VIDEO visit in appointment notes b. If no - list the appointment type as a PHONE visit in appointment notes  3. Confirm consent - "In the setting of the current Covid19 crisis, you are scheduled for a (phone or video) visit with your provider on (date) at (time).  Just as we do with many in-office visits, in order for you to participate in this visit, we must obtain consent.  If you'd like, I can send this to your mychart (if signed up) or email for you to review.  Otherwise, I can obtain your verbal consent now.  All virtual visits are billed to your insurance company just like a normal visit would be.  By agreeing to a virtual visit, we'd like you to understand that the technology does not allow for your provider to perform an examination, and thus may limit your provider's ability to fully assess your condition. If your provider identifies any concerns that need to be evaluated in person, we will make arrangements to do so.  Finally, though the technology is pretty good, we cannot assure that it will always work on either your or our end, and in the setting of a video visit, we may have to convert it to a phone-only visit.  In either situation, we cannot ensure that we have a secure connection.  Are you willing to proceed?" STAFF: Did the patient verbally acknowledge consent to telehealth visit? Document  YES/NO here: YES  4. Advise patient to be prepared - "Two hours prior to your appointment, go ahead and check your blood pressure, pulse, oxygen saturation, and your weight (if you have the equipment to check those) and write them all down. When your visit starts, your provider will ask you for this information. If you have an Apple Watch or Kardia device, please plan to have heart rate information ready on the day of your appointment. Please have a pen and paper handy nearby the day of the visit as well."  5. Give patient instructions for MyChart download to smartphone OR [email or web address] as below if video visit (depending on what platform provider is using)  6. Inform patient they will receive a phone call 15 minutes prior to their appointment time (may be from unknown caller ID) so they should be prepared to answer    TELEPHONE CALL NOTE  [name] has been deemed a candidate for a follow-up tele-health visit to limit community exposure during the Covid-19 pandemic. I spoke with the patient via phone to ensure availability of phone/video source, confirm preferred email & phone number, and discuss instructions and expectations.  I reminded [name] to be prepared with any vital sign and/or heart rhythm information that could potentially be obtained via home monitoring, at the time of his visit. I reminded [name] to expect a phone call prior to his visit.  Frederic Jericho 10/07/2018 8:45 AM   INSTRUCTIONS FOR DOWNLOADING THE MYCHART APP TO SMARTPHONE  - The patient must first make sure to have activated MyChart and know their login information - If Apple, go to [employer] and type in MyChart in the search bar and download the app. If Android, ask patient to go to [employer] and type in [place] in the search bar and download the app. The app is free but as with any other app downloads, their phone may require them to verify saved payment information or Apple/Android password.  -  The patient will need to then log into the app with their MyChart username and password, and select [facility] as their healthcare provider to link the account. When it is time for your visit, go to the MyChart app, find appointments, and click Begin Video Visit. Be sure to Select Allow for your device to access the Microphone and Camera for your visit. You will then be connected, and your provider will be with you shortly.  **If they have any issues connecting, or need assistance please contact MyChart service desk [phone])**  **If using a computer, in order to ensure the best quality for their visit they will need to use either of the following Internet Browsers: [employer], or Google Chrome**  IF USING DOXIMITY or DOXY.ME - The patient will receive a link just prior to their visit by text.     FULL LENGTH CONSENT FOR TELE-HEALTH VISIT   I hereby voluntarily request, consent and authorize [facility] and its employed or contracted physicians, physician assistants, nurse practitioners or other licensed health care professionals (the Practitioner), to provide me with telemedicine health care services (the Services") as deemed necessary by the treating Practitioner. I acknowledge and consent to receive the Services by the Practitioner via telemedicine. I understand that the telemedicine visit will involve communicating with the Practitioner through live audiovisual communication technology and the disclosure of certain medical information by electronic transmission. I acknowledge that I have been given the opportunity to request an in-person assessment or other available alternative prior to the telemedicine visit and am voluntarily participating in the telemedicine visit.  I understand that I have the right to withhold or withdraw my consent to the use of telemedicine in the course of my care at any time, without affecting my right to future care or treatment, and that the  Practitioner or I may terminate the telemedicine visit at any time. I understand that I have the right to inspect all information obtained and/or recorded in the course of the telemedicine visit and may receive copies of available information for a reasonable fee.  I understand that some of the potential risks of receiving the Services via telemedicine include:   Delay or interruption in medical evaluation due to technological equipment failure or disruption;  Information transmitted may not be sufficient (e.g. poor resolution of images) to allow for appropriate medical decision making by the Practitioner; and/or   In rare instances, security protocols could fail, causing a breach of personal health information.  Furthermore, I acknowledge that it is my responsibility to provide information about my medical history, conditions and care that is complete and accurate to the best of my ability. I acknowledge that Practitioner's advice, recommendations, and/or decision may be based on factors not within their control, such as incomplete or inaccurate data provided by me or distortions of diagnostic images or specimens that may result from electronic transmissions. I understand that the  practice of medicine is not an Chief Strategy Officer and that Practitioner makes no warranties or guarantees regarding treatment outcomes. I acknowledge that I will receive a copy of this consent concurrently upon execution via email to the email address I last provided but may also request a printed copy by calling the office of [facility].    I understand that my insurance will be billed for this visit.   I have read or had this consent read to me.  I understand the contents of this consent, which adequately explains the benefits and risks of the Services being provided via telemedicine.   I have been provided ample opportunity to ask questions regarding this consent and the Services and have had my questions answered to my  satisfaction.  I give my informed consent for the services to be provided through the use of telemedicine in my medical care  By participating in this telemedicine visit I agree to the above.

## 2018-10-09 ENCOUNTER — Encounter: Payer: Self-pay | Admitting: Cardiology

## 2018-10-09 ENCOUNTER — Other Ambulatory Visit: Payer: Self-pay

## 2018-10-09 ENCOUNTER — Telehealth (INDEPENDENT_AMBULATORY_CARE_PROVIDER_SITE_OTHER): Payer: Medicare Other | Admitting: Cardiology

## 2018-10-09 VITALS — BP 145/87 | Ht 69.5 in | Wt 176.0 lb

## 2018-10-09 DIAGNOSIS — R0609 Other forms of dyspnea: Secondary | ICD-10-CM

## 2018-10-09 DIAGNOSIS — J9621 Acute and chronic respiratory failure with hypoxia: Secondary | ICD-10-CM | POA: Diagnosis not present

## 2018-10-09 DIAGNOSIS — J4520 Mild intermittent asthma, uncomplicated: Secondary | ICD-10-CM | POA: Diagnosis not present

## 2018-10-09 DIAGNOSIS — J438 Other emphysema: Secondary | ICD-10-CM

## 2018-10-09 DIAGNOSIS — G4733 Obstructive sleep apnea (adult) (pediatric): Secondary | ICD-10-CM | POA: Diagnosis not present

## 2018-10-09 DIAGNOSIS — I10 Essential (primary) hypertension: Secondary | ICD-10-CM

## 2018-10-09 NOTE — Progress Notes (Signed)
Virtual Visit via Video Note   This visit type was conducted due to national recommendations for restrictions regarding the COVID-19 Pandemic (e.g. social distancing) in an effort to limit this patient's exposure and mitigate transmission in our community.  Due to his co-morbid illnesses, this patient is at least at moderate risk for complications without adequate follow up.  This format is felt to be most appropriate for this patient at this time.  All issues noted in this document were discussed and addressed.  A limited physical exam was performed with this format.  Please refer to the patient's chart for his consent to telehealth for [name].   Evaluation Performed:  Follow-up visit  Date:  10/09/2018   ID:  [name], DOB 1932-09-13, MRN [id number]  Patient Location: Home Provider Location: Home  PCP:  Antony Contras, MD  Cardiologist:  No primary care provider on file.  Electrophysiologist:  None   Chief Complaint: Dyspnea on exertion  History of Present Illness:    [name] is a 85 y.o. male with past medical history of essential hypertension and COPD.  In 2016 he is undergone coronary angiography with normal-appearing coronary arteries.  His ejection fraction was preserved.  He complains of shortness of breath on exertion and also has been evaluated by my partner in the past.  At the time of my evaluation, the patient is alert awake oriented and in no distress.  The patient does not have symptoms concerning for COVID-19 infection (fever, chills, cough, or new shortness of breath).    Past Medical History:  Diagnosis Date  . Acne rosacea   . Asthma   . COPD (chronic obstructive pulmonary disease) ([place])   . GERD without esophagitis   . Hx of pancreatitis   . Hyperlipidemia   . 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 Memory difficulty 05/15/2017  . Memory loss   . Prostatic hyperplasia   . Restless leg syndrome   . Sleep  apnea    Past Surgical History:  Procedure Laterality Date  . BACK SURGERY  1980  . high cholesterol    . SHOULDER SURGERY Left      Current Meds  Medication Sig  . albuterol (PROVENTIL HFA;VENTOLIN HFA) 108 (90 Base) MCG/ACT inhaler Inhale 2 puffs into the lungs every 6 (six) hours as needed for wheezing or shortness of breath.  . ALPRAZolam (XANAX) 0.5 MG tablet Take 1 tablet by mouth as needed.  Marland Kitchen amLODipine (NORVASC) 2.5 MG tablet Take 1 tablet by mouth daily.  . carvedilol (COREG) 6.25 MG tablet Take 1 tablet by mouth 2 (two) times daily.  Marland Kitchen losartan (COZAAR) 100 MG tablet Take 100 mg by mouth daily.  . montelukast (SINGULAIR) 10 MG tablet Take 10 mg by mouth at bedtime.  Marland Kitchen omeprazole (PRILOSEC) 20 MG capsule Take 20 mg by mouth daily as needed (heartburn).   . pravastatin (PRAVACHOL) 40 MG tablet Take 40 mg by mouth daily.  . tamsulosin (FLOMAX) 0.4 MG CAPS capsule Take 1 capsule by mouth as needed.     Allergies:   Codeine and Penicillins   Social History   Tobacco Use  . Smoking status: Former Smoker    Types: Cigarettes    Last attempt to quit: 07/10/1978    Years since quitting: 40.2  . Smokeless tobacco: Never Used  Substance Use Topics  . Alcohol use: No    Frequency: Never  . Drug use: No  Family Hx: The patient's family history includes CVA in his mother; Cancer - Lung in his brother and brother; Cancer - Prostate in his father; Hypertension in his mother.  ROS:   Please see the history of present illness.    Again as mentioned above All other systems reviewed and are negative.   Prior CV studies:   The following studies were reviewed today:  Coronary angiography report from 2016 and echocardiogram from 2018 were reviewed  Labs/Other Tests and Data Reviewed:    EKG:  An ECG dated 04/11/2017 was personally reviewed today and demonstrated:  Sinus rhythm and left axis deviation and nonspecific ST-T changes and possible old inferior wall myocardial  infarction  Recent Labs: No results found for requested labs within last 8760 hours.   Recent Lipid Panel No results found for: CHOL, TRIG, HDL, CHOLHDL, LDLCALC, LDLDIRECT  Wt Readings from Last 3 Encounters:  10/09/18 176 lb (79.8 kg)  07/10/17 168 lb 12.8 oz (76.6 kg)  05/15/17 167 lb 8 oz (76 kg)     Objective:    Vital Signs:  BP (!) 145/87 (BP Location: Left Arm, Patient Position: Sitting, Cuff Size: Normal)   Ht 5' 9.5" (1.765 m)   Wt 176 lb (79.8 kg)   BMI 25.62 kg/m    VITAL SIGNS:  reviewed  ASSESSMENT & PLAN:    1. Dyspnea on exertion: I agree with my colleagues who have evaluated him in the past.  His coronaries are unremarkable this was about 4 years ago or less.  I doubt coronary artery disease as a cause of his symptoms.  Also his left ventricle systolic function is preserved. 2. Essential hypertension: The patient is on a beta-blocker and in view of his COPD and the fact that he uses inhalers I suggested to him that he could stop carvedilol increase amlodipine.  He is seeing pulmonologist tomorrow and will discuss this with him.  We will make sure will send a copy of his note from my evaluation today to his pulmonologist and primary care physician. 3. He is being treated for his pulmonary issues by his pulmonologist taken the course of antibiotics and also is on inhalers. 4. Patient will be seen in follow-up appointment in 3 months or earlier if the patient has any concerns   COVID-19 Education: The signs and symptoms of COVID-19 were discussed with the patient and how to seek care for testing (follow up with PCP or arrange E-visit).  The importance of social distancing was discussed today.  Time:   Today, I have spent 16 minutes with the patient with telehealth technology discussing the above problems.     Medication Adjustments/Labs and Tests Ordered: Current medicines are reviewed at length with the patient today.  Concerns regarding medicines are outlined  above.   Tests Ordered: No orders of the defined types were placed in this encounter.   Medication Changes: No orders of the defined types were placed in this encounter.   Disposition:  Follow up 81mo   Signed, Jenean Lindau, MD  10/09/2018 1:53 PM    [facility]

## 2018-10-09 NOTE — Patient Instructions (Signed)

## 2018-10-10 DIAGNOSIS — J301 Allergic rhinitis due to pollen: Secondary | ICD-10-CM | POA: Diagnosis not present

## 2018-10-10 DIAGNOSIS — J4520 Mild intermittent asthma, uncomplicated: Secondary | ICD-10-CM | POA: Diagnosis not present

## 2018-10-10 DIAGNOSIS — G4733 Obstructive sleep apnea (adult) (pediatric): Secondary | ICD-10-CM | POA: Diagnosis not present

## 2018-10-10 DIAGNOSIS — G2581 Restless legs syndrome: Secondary | ICD-10-CM | POA: Diagnosis not present

## 2018-10-11 DIAGNOSIS — J439 Emphysema, unspecified: Secondary | ICD-10-CM | POA: Diagnosis not present

## 2018-10-11 DIAGNOSIS — I130 Hypertensive heart and chronic kidney disease with heart failure and stage 1 through stage 4 chronic kidney disease, or unspecified chronic kidney disease: Secondary | ICD-10-CM | POA: Diagnosis not present

## 2018-10-11 DIAGNOSIS — J45909 Unspecified asthma, uncomplicated: Secondary | ICD-10-CM | POA: Diagnosis not present

## 2018-10-11 DIAGNOSIS — K219 Gastro-esophageal reflux disease without esophagitis: Secondary | ICD-10-CM | POA: Diagnosis not present

## 2018-10-11 DIAGNOSIS — G4700 Insomnia, unspecified: Secondary | ICD-10-CM | POA: Diagnosis not present

## 2018-10-11 DIAGNOSIS — M353 Polymyalgia rheumatica: Secondary | ICD-10-CM | POA: Diagnosis not present

## 2018-10-11 DIAGNOSIS — G4730 Sleep apnea, unspecified: Secondary | ICD-10-CM | POA: Diagnosis not present

## 2018-10-11 DIAGNOSIS — J449 Chronic obstructive pulmonary disease, unspecified: Secondary | ICD-10-CM | POA: Diagnosis not present

## 2018-10-11 DIAGNOSIS — E7800 Pure hypercholesterolemia, unspecified: Secondary | ICD-10-CM | POA: Diagnosis not present

## 2018-10-11 DIAGNOSIS — N400 Enlarged prostate without lower urinary tract symptoms: Secondary | ICD-10-CM | POA: Diagnosis not present

## 2018-10-11 DIAGNOSIS — N183 Chronic kidney disease, stage 3 (moderate): Secondary | ICD-10-CM | POA: Diagnosis not present

## 2018-10-11 DIAGNOSIS — G454 Transient global amnesia: Secondary | ICD-10-CM | POA: Diagnosis not present

## 2018-10-28 DIAGNOSIS — J4520 Mild intermittent asthma, uncomplicated: Secondary | ICD-10-CM | POA: Diagnosis not present

## 2018-10-28 DIAGNOSIS — G4733 Obstructive sleep apnea (adult) (pediatric): Secondary | ICD-10-CM | POA: Diagnosis not present

## 2018-10-28 DIAGNOSIS — G2581 Restless legs syndrome: Secondary | ICD-10-CM | POA: Diagnosis not present

## 2018-10-28 DIAGNOSIS — J301 Allergic rhinitis due to pollen: Secondary | ICD-10-CM | POA: Diagnosis not present

## 2018-10-29 DIAGNOSIS — C44629 Squamous cell carcinoma of skin of left upper limb, including shoulder: Secondary | ICD-10-CM | POA: Diagnosis not present

## 2018-10-29 DIAGNOSIS — D485 Neoplasm of uncertain behavior of skin: Secondary | ICD-10-CM | POA: Diagnosis not present

## 2018-11-28 DIAGNOSIS — J4520 Mild intermittent asthma, uncomplicated: Secondary | ICD-10-CM | POA: Diagnosis not present

## 2018-11-28 DIAGNOSIS — G2581 Restless legs syndrome: Secondary | ICD-10-CM | POA: Diagnosis not present

## 2018-11-28 DIAGNOSIS — J301 Allergic rhinitis due to pollen: Secondary | ICD-10-CM | POA: Diagnosis not present

## 2018-11-28 DIAGNOSIS — G4733 Obstructive sleep apnea (adult) (pediatric): Secondary | ICD-10-CM | POA: Diagnosis not present

## 2018-12-31 ENCOUNTER — Telehealth: Payer: Self-pay | Admitting: Cardiology

## 2018-12-31 NOTE — Telephone Encounter (Signed)
Called patient to inform him that Dr Geraldo Pitter would not be available for the Televisit due to out office and unable to leave VM due to no answer.  I rescheduled til next available on schedule.

## 2019-01-08 ENCOUNTER — Telehealth: Payer: Medicare Other | Admitting: Cardiology

## 2019-01-14 DIAGNOSIS — J4520 Mild intermittent asthma, uncomplicated: Secondary | ICD-10-CM | POA: Diagnosis not present

## 2019-01-14 DIAGNOSIS — J301 Allergic rhinitis due to pollen: Secondary | ICD-10-CM | POA: Diagnosis not present

## 2019-01-14 DIAGNOSIS — G4733 Obstructive sleep apnea (adult) (pediatric): Secondary | ICD-10-CM | POA: Diagnosis not present

## 2019-01-14 DIAGNOSIS — G2581 Restless legs syndrome: Secondary | ICD-10-CM | POA: Diagnosis not present

## 2019-01-14 DIAGNOSIS — J209 Acute bronchitis, unspecified: Secondary | ICD-10-CM | POA: Diagnosis not present

## 2019-01-15 ENCOUNTER — Telehealth: Payer: Medicare Other | Admitting: Cardiology

## 2019-01-17 DIAGNOSIS — G2581 Restless legs syndrome: Secondary | ICD-10-CM | POA: Diagnosis not present

## 2019-01-17 DIAGNOSIS — G4733 Obstructive sleep apnea (adult) (pediatric): Secondary | ICD-10-CM | POA: Diagnosis not present

## 2019-01-17 DIAGNOSIS — J301 Allergic rhinitis due to pollen: Secondary | ICD-10-CM | POA: Diagnosis not present

## 2019-01-17 DIAGNOSIS — J209 Acute bronchitis, unspecified: Secondary | ICD-10-CM | POA: Diagnosis not present

## 2019-01-17 DIAGNOSIS — J4520 Mild intermittent asthma, uncomplicated: Secondary | ICD-10-CM | POA: Diagnosis not present

## 2019-01-17 DIAGNOSIS — I10 Essential (primary) hypertension: Secondary | ICD-10-CM | POA: Diagnosis not present

## 2019-01-30 DIAGNOSIS — L821 Other seborrheic keratosis: Secondary | ICD-10-CM | POA: Diagnosis not present

## 2019-01-30 DIAGNOSIS — L570 Actinic keratosis: Secondary | ICD-10-CM | POA: Diagnosis not present

## 2019-01-30 DIAGNOSIS — C44629 Squamous cell carcinoma of skin of left upper limb, including shoulder: Secondary | ICD-10-CM | POA: Diagnosis not present

## 2019-02-13 DIAGNOSIS — Z23 Encounter for immunization: Secondary | ICD-10-CM | POA: Diagnosis not present

## 2019-02-18 DIAGNOSIS — M810 Age-related osteoporosis without current pathological fracture: Secondary | ICD-10-CM | POA: Diagnosis not present

## 2019-02-18 DIAGNOSIS — M064 Inflammatory polyarthropathy: Secondary | ICD-10-CM | POA: Diagnosis not present

## 2019-02-18 DIAGNOSIS — M79643 Pain in unspecified hand: Secondary | ICD-10-CM | POA: Diagnosis not present

## 2019-02-18 DIAGNOSIS — M353 Polymyalgia rheumatica: Secondary | ICD-10-CM | POA: Diagnosis not present

## 2019-02-18 DIAGNOSIS — M7989 Other specified soft tissue disorders: Secondary | ICD-10-CM | POA: Diagnosis not present

## 2019-02-18 DIAGNOSIS — M1990 Unspecified osteoarthritis, unspecified site: Secondary | ICD-10-CM | POA: Diagnosis not present

## 2019-03-18 DIAGNOSIS — J301 Allergic rhinitis due to pollen: Secondary | ICD-10-CM | POA: Diagnosis not present

## 2019-03-18 DIAGNOSIS — G4733 Obstructive sleep apnea (adult) (pediatric): Secondary | ICD-10-CM | POA: Diagnosis not present

## 2019-03-18 DIAGNOSIS — J4520 Mild intermittent asthma, uncomplicated: Secondary | ICD-10-CM | POA: Diagnosis not present

## 2019-03-18 DIAGNOSIS — G2581 Restless legs syndrome: Secondary | ICD-10-CM | POA: Diagnosis not present

## 2019-03-25 DIAGNOSIS — G2581 Restless legs syndrome: Secondary | ICD-10-CM | POA: Diagnosis not present

## 2019-03-25 DIAGNOSIS — J301 Allergic rhinitis due to pollen: Secondary | ICD-10-CM | POA: Diagnosis not present

## 2019-03-25 DIAGNOSIS — R531 Weakness: Secondary | ICD-10-CM | POA: Diagnosis not present

## 2019-03-25 DIAGNOSIS — J4520 Mild intermittent asthma, uncomplicated: Secondary | ICD-10-CM | POA: Diagnosis not present

## 2019-03-25 DIAGNOSIS — G4733 Obstructive sleep apnea (adult) (pediatric): Secondary | ICD-10-CM | POA: Diagnosis not present

## 2019-03-25 DIAGNOSIS — R42 Dizziness and giddiness: Secondary | ICD-10-CM | POA: Diagnosis not present

## 2019-04-03 DIAGNOSIS — E7800 Pure hypercholesterolemia, unspecified: Secondary | ICD-10-CM | POA: Diagnosis not present

## 2019-04-03 DIAGNOSIS — M064 Inflammatory polyarthropathy: Secondary | ICD-10-CM | POA: Diagnosis not present

## 2019-04-03 DIAGNOSIS — M353 Polymyalgia rheumatica: Secondary | ICD-10-CM | POA: Diagnosis not present

## 2019-04-03 DIAGNOSIS — M79643 Pain in unspecified hand: Secondary | ICD-10-CM | POA: Diagnosis not present

## 2019-04-03 DIAGNOSIS — M7989 Other specified soft tissue disorders: Secondary | ICD-10-CM | POA: Diagnosis not present

## 2019-04-03 DIAGNOSIS — M1990 Unspecified osteoarthritis, unspecified site: Secondary | ICD-10-CM | POA: Diagnosis not present

## 2019-04-03 DIAGNOSIS — M810 Age-related osteoporosis without current pathological fracture: Secondary | ICD-10-CM | POA: Diagnosis not present

## 2019-04-03 DIAGNOSIS — Z7952 Long term (current) use of systemic steroids: Secondary | ICD-10-CM | POA: Diagnosis not present

## 2019-04-15 DIAGNOSIS — D485 Neoplasm of uncertain behavior of skin: Secondary | ICD-10-CM | POA: Diagnosis not present

## 2019-04-15 DIAGNOSIS — L570 Actinic keratosis: Secondary | ICD-10-CM | POA: Diagnosis not present

## 2019-04-15 DIAGNOSIS — L578 Other skin changes due to chronic exposure to nonionizing radiation: Secondary | ICD-10-CM | POA: Diagnosis not present

## 2019-04-15 DIAGNOSIS — D225 Melanocytic nevi of trunk: Secondary | ICD-10-CM | POA: Diagnosis not present

## 2019-04-17 DIAGNOSIS — C44519 Basal cell carcinoma of skin of other part of trunk: Secondary | ICD-10-CM | POA: Diagnosis not present

## 2019-04-23 DIAGNOSIS — J301 Allergic rhinitis due to pollen: Secondary | ICD-10-CM | POA: Diagnosis not present

## 2019-04-23 DIAGNOSIS — J4520 Mild intermittent asthma, uncomplicated: Secondary | ICD-10-CM | POA: Diagnosis not present

## 2019-04-23 DIAGNOSIS — G2581 Restless legs syndrome: Secondary | ICD-10-CM | POA: Diagnosis not present

## 2019-04-23 DIAGNOSIS — G4733 Obstructive sleep apnea (adult) (pediatric): Secondary | ICD-10-CM | POA: Diagnosis not present

## 2019-05-13 DIAGNOSIS — G4733 Obstructive sleep apnea (adult) (pediatric): Secondary | ICD-10-CM | POA: Diagnosis not present

## 2019-05-19 DIAGNOSIS — J4520 Mild intermittent asthma, uncomplicated: Secondary | ICD-10-CM | POA: Diagnosis not present

## 2019-05-19 DIAGNOSIS — G2581 Restless legs syndrome: Secondary | ICD-10-CM | POA: Diagnosis not present

## 2019-05-19 DIAGNOSIS — J301 Allergic rhinitis due to pollen: Secondary | ICD-10-CM | POA: Diagnosis not present

## 2019-05-19 DIAGNOSIS — G4733 Obstructive sleep apnea (adult) (pediatric): Secondary | ICD-10-CM | POA: Diagnosis not present

## 2019-05-28 DIAGNOSIS — Z20828 Contact with and (suspected) exposure to other viral communicable diseases: Secondary | ICD-10-CM | POA: Diagnosis not present

## 2019-06-09 DIAGNOSIS — Z03818 Encounter for observation for suspected exposure to other biological agents ruled out: Secondary | ICD-10-CM | POA: Diagnosis not present

## 2019-06-16 DIAGNOSIS — U071 COVID-19: Secondary | ICD-10-CM | POA: Diagnosis not present

## 2019-06-16 DIAGNOSIS — J301 Allergic rhinitis due to pollen: Secondary | ICD-10-CM | POA: Diagnosis not present

## 2019-06-16 DIAGNOSIS — G4733 Obstructive sleep apnea (adult) (pediatric): Secondary | ICD-10-CM | POA: Diagnosis not present

## 2019-06-16 DIAGNOSIS — J4520 Mild intermittent asthma, uncomplicated: Secondary | ICD-10-CM | POA: Diagnosis not present

## 2019-06-16 DIAGNOSIS — G2581 Restless legs syndrome: Secondary | ICD-10-CM | POA: Diagnosis not present

## 2019-07-21 DIAGNOSIS — M353 Polymyalgia rheumatica: Secondary | ICD-10-CM | POA: Diagnosis not present

## 2019-07-21 DIAGNOSIS — Z7952 Long term (current) use of systemic steroids: Secondary | ICD-10-CM | POA: Diagnosis not present

## 2019-07-21 DIAGNOSIS — M1990 Unspecified osteoarthritis, unspecified site: Secondary | ICD-10-CM | POA: Diagnosis not present

## 2019-07-21 DIAGNOSIS — M810 Age-related osteoporosis without current pathological fracture: Secondary | ICD-10-CM | POA: Diagnosis not present

## 2019-07-21 DIAGNOSIS — M79643 Pain in unspecified hand: Secondary | ICD-10-CM | POA: Diagnosis not present

## 2019-09-08 DIAGNOSIS — C44529 Squamous cell carcinoma of skin of other part of trunk: Secondary | ICD-10-CM | POA: Diagnosis not present

## 2019-09-17 DIAGNOSIS — N138 Other obstructive and reflux uropathy: Secondary | ICD-10-CM | POA: Diagnosis not present

## 2019-09-17 DIAGNOSIS — N401 Enlarged prostate with lower urinary tract symptoms: Secondary | ICD-10-CM | POA: Diagnosis not present

## 2019-10-02 DIAGNOSIS — Z961 Presence of intraocular lens: Secondary | ICD-10-CM | POA: Diagnosis not present

## 2019-11-07 ENCOUNTER — Ambulatory Visit (INDEPENDENT_AMBULATORY_CARE_PROVIDER_SITE_OTHER): Payer: Medicare Other

## 2019-11-07 ENCOUNTER — Encounter: Payer: Self-pay | Admitting: Sports Medicine

## 2019-11-07 ENCOUNTER — Other Ambulatory Visit: Payer: Self-pay

## 2019-11-07 ENCOUNTER — Ambulatory Visit (INDEPENDENT_AMBULATORY_CARE_PROVIDER_SITE_OTHER): Payer: Medicare Other | Admitting: Sports Medicine

## 2019-11-07 DIAGNOSIS — M79671 Pain in right foot: Secondary | ICD-10-CM

## 2019-11-07 DIAGNOSIS — M722 Plantar fascial fibromatosis: Secondary | ICD-10-CM

## 2019-11-07 DIAGNOSIS — M79672 Pain in left foot: Secondary | ICD-10-CM

## 2019-11-07 MED ORDER — TRIAMCINOLONE ACETONIDE 10 MG/ML IJ SUSP
10.0000 mg | Freq: Once | INTRAMUSCULAR | Status: AC
  Administered 2019-11-07: 10 mg

## 2019-11-07 NOTE — Progress Notes (Signed)
Subjective: [name] is a 86 y.o. male patient presents to office with complaint of moderate heel pain on the left and right. Patient admits to post static dyskinesia for 3 months in duration. Patient has treated this problem with no treatment.  Denies any other pedal complaints.   Review of Systems  All other systems reviewed and are negative.   Patient Active Problem List   Diagnosis Date Noted  . DOE (dyspnea on exertion) 10/09/2018  . 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 Chronic prostatitis 07/31/2017  . Memory difficulty 05/15/2017  . Other emphysema ([place]) 07/12/2016  . Essential hypertension 07/12/2016  . Elevated TSH 04/25/2016  . Fatigue 04/25/2016  . Orthostatic hypotension 04/25/2016  . Insomnia 04/17/2016  . Chronic bronchitis ([place]) 12/28/2015  . Benign non-nodular prostatic hyperplasia with lower urinary tract symptoms 11/18/2015  . Restless legs 11/18/2015  . Non-seasonal allergic rhinitis 10/13/2015  . Gastroesophageal reflux disease without esophagitis 08/09/2015  . Abnormal TSH 08/05/2015  . Anemia 08/05/2015  . Hypercalcemia 08/05/2015  . Hyperlipidemia 08/05/2015  . Macrocytosis 08/05/2015  . Nodule of chest wall 08/05/2015  . Dyslipidemia 05/10/2015    Current Outpatient Medications on File Prior to Visit  Medication Sig Dispense Refill  . amLODipine (NORVASC) 2.5 MG tablet Take 1 tablet by mouth daily.     No current facility-administered medications on file prior to visit.    Allergies  Allergen Reactions  . Codeine     SICK  . Penicillins     Objective: Physical Exam General: The patient is alert and oriented x3 in no acute distress.  Dermatology: Skin is warm, dry and supple bilateral lower extremities. Nails 1-10 are normal. There is no erythema, edema, no eccymosis, no open lesions present. Integument is otherwise unremarkable.  Vascular: Dorsalis Pedis pulse and Posterior Tibial pulse are 1/4 bilateral. Capillary fill time is immediate to  all digits.  Neurological: Grossly intact to light touch with an achilles reflex of +2/5 and a  negative Tinel's sign bilateral.  Musculoskeletal: Tenderness to palpation at the medial calcaneal tubercale and through the insertion of the plantar fascia on the left and right foot. No pain with compression of calcaneus bilateral. No pain with tuning fork to calcaneus bilateral. No pain with calf compression bilateral. There is decreased Ankle joint range of motion bilateral. All other joints range of motion within normal limits bilateral.  Pes planus foot type.  Strength 5/5 in all groups bilateral.   Gait: Unassisted  Xray, Right/Left foot:  Normal osseous mineralization. Joint spaces preserved. No fracture/dislocation/boney destruction. Calcaneal spur present with mild thickening of plantar fascia. No other soft tissue abnormalities or radiopaque foreign bodies.   Assessment and Plan: Problem List Items Addressed This Visit    None    Visit Diagnoses    Plantar fasciitis, bilateral    -  Primary   Relevant Medications   triamcinolone acetonide (KENALOG) 10 MG/ML injection 10 mg (Completed) (Start on 11/07/2019  1:15 PM)   Bilateral foot pain       Relevant Orders   DG Foot Complete Right   DG Foot Complete Left      -Complete examination performed.  -Xrays reviewed -Discussed with patient in detail the condition of plantar fasciitis, how this occurs and general treatment options. Explained both conservative and surgical treatments.  -After oral consent and aseptic prep, injected a mixture containing 1 ml of 2%  plain lidocaine, 1 ml 0.5% plain marcaine, 0.5 ml of kenalog 10 and 0.5 ml of dexamethasone phosphate  into left/right heel. Post-injection care discussed with patient.  -Recommended good supportive shoes and advised use of heel lifts as dispensed at today's visit -Explained and dispensed to patient daily stretching exercises. -Recommend patient to ice affected area 1-2x  daily. -Patient to return to office if fails to continue to improve or sooner if problems or questions arise.  Landis Martins, DPM

## 2019-11-07 NOTE — Patient Instructions (Signed)

## 2019-11-14 DIAGNOSIS — J4520 Mild intermittent asthma, uncomplicated: Secondary | ICD-10-CM | POA: Diagnosis not present

## 2019-11-14 DIAGNOSIS — G4733 Obstructive sleep apnea (adult) (pediatric): Secondary | ICD-10-CM | POA: Diagnosis not present

## 2019-11-14 DIAGNOSIS — G2581 Restless legs syndrome: Secondary | ICD-10-CM | POA: Diagnosis not present

## 2019-11-14 DIAGNOSIS — J069 Acute upper respiratory infection, unspecified: Secondary | ICD-10-CM | POA: Diagnosis not present

## 2019-11-14 DIAGNOSIS — J301 Allergic rhinitis due to pollen: Secondary | ICD-10-CM | POA: Diagnosis not present

## 2019-11-19 ENCOUNTER — Other Ambulatory Visit: Payer: Self-pay | Admitting: Sports Medicine

## 2019-11-19 DIAGNOSIS — M722 Plantar fascial fibromatosis: Secondary | ICD-10-CM

## 2019-12-03 ENCOUNTER — Encounter: Payer: Self-pay | Admitting: Sports Medicine

## 2019-12-03 ENCOUNTER — Other Ambulatory Visit: Payer: Self-pay

## 2019-12-03 ENCOUNTER — Ambulatory Visit (INDEPENDENT_AMBULATORY_CARE_PROVIDER_SITE_OTHER): Payer: Medicare Other | Admitting: Sports Medicine

## 2019-12-03 DIAGNOSIS — M79672 Pain in left foot: Secondary | ICD-10-CM

## 2019-12-03 DIAGNOSIS — M79671 Pain in right foot: Secondary | ICD-10-CM

## 2019-12-03 DIAGNOSIS — M722 Plantar fascial fibromatosis: Secondary | ICD-10-CM

## 2019-12-03 MED ORDER — TRIAMCINOLONE ACETONIDE 10 MG/ML IJ SUSP
10.0000 mg | Freq: Once | INTRAMUSCULAR | Status: AC
  Administered 2019-12-03: 13:00:00 10 mg

## 2019-12-03 NOTE — Progress Notes (Signed)
Subjective: [name] is a 86 y.o. male patient returns to office with complaint of moderate heel pain on the left.  Reports that the right foot is doing okay does not bother him like the left.  Patient reports that previous injection helped and the pain slowly started to come back unable to stand or walk long due to the pain in his left heel.  Reports that he has been icing twice daily with a little relief but pain is still present worse on the left.  Patient denies any other pedal complaints.   Patient Active Problem List   Diagnosis Date Noted  . DOE (dyspnea on exertion) 10/09/2018  . 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 Chronic prostatitis 07/31/2017  . Memory difficulty 05/15/2017  . Other emphysema ([place]) 07/12/2016  . Essential hypertension 07/12/2016  . Elevated TSH 04/25/2016  . Fatigue 04/25/2016  . Orthostatic hypotension 04/25/2016  . Insomnia 04/17/2016  . Chronic bronchitis ([place]) 12/28/2015  . Benign non-nodular prostatic hyperplasia with lower urinary tract symptoms 11/18/2015  . Restless legs 11/18/2015  . Non-seasonal allergic rhinitis 10/13/2015  . Gastroesophageal reflux disease without esophagitis 08/09/2015  . Abnormal TSH 08/05/2015  . Anemia 08/05/2015  . Hypercalcemia 08/05/2015  . Hyperlipidemia 08/05/2015  . Macrocytosis 08/05/2015  . Nodule of chest wall 08/05/2015  . Dyslipidemia 05/10/2015    Current Outpatient Medications on File Prior to Visit  Medication Sig Dispense Refill  . amLODipine (NORVASC) 2.5 MG tablet Take 1 tablet by mouth daily.     No current facility-administered medications on file prior to visit.    Allergies  Allergen Reactions  . Codeine     SICK  . Penicillins     Objective: Physical Exam General: The patient is alert and oriented x3 in no acute distress.  Dermatology: Skin is warm, dry and supple bilateral lower extremities. Nails 1-10 are normal.  Integument is otherwise unremarkable.  Vascular: Dorsalis Pedis pulse  and Posterior Tibial pulse are 1/4 bilateral. Capillary fill time is immediate to all digits.  Neurological: Gross sensation present via light touch bilateral.  Musculoskeletal: Tenderness to palpation at the medial calcaneal tubercale and through the insertion of the plantar fascia on the left and no reproducible pain to right.  Pes planus foot type.  Strength 5/5 in all groups bilateral.   Assessment and Plan: Problem List Items Addressed This Visit    None    Visit Diagnoses    Plantar fasciitis, bilateral    -  Primary   Relevant Medications   triamcinolone acetonide (KENALOG) 10 MG/ML injection 10 mg (Completed) (Start on 12/03/2019 12:45 PM)   Bilateral foot pain         -Complete examination performed.  -Previous xrays reviewed -Re-Discussed with patient in detail the condition of plantar fasciitis, how this occurs and general treatment options. Explained both conservative and surgical treatments.  -After oral consent and aseptic prep, injected a mixture containing 1 ml of 2% plain lidocaine, 1 ml 0.5% plain marcaine, 0.5 ml of kenalog 10 and 0.5 ml of dexamethasone phosphate into left heel via a plantar approach at the plantar fascial insertion. Post-injection care discussed with patient.  -Recommended good supportive shoes and advised use of heel lifts as dispensed at today's visit in all shoes even his bedroom shoes at home -Reeducated patient on proper stretching and advised patient to continue to ice the area 1-2 times daily -Patient to return to office in 1 month for follow-up evaluation or sooner if problems or issues arise.  Landis Martins, DPM

## 2019-12-31 DIAGNOSIS — J301 Allergic rhinitis due to pollen: Secondary | ICD-10-CM | POA: Diagnosis not present

## 2019-12-31 DIAGNOSIS — G2581 Restless legs syndrome: Secondary | ICD-10-CM | POA: Diagnosis not present

## 2019-12-31 DIAGNOSIS — G4733 Obstructive sleep apnea (adult) (pediatric): Secondary | ICD-10-CM | POA: Diagnosis not present

## 2019-12-31 DIAGNOSIS — J4520 Mild intermittent asthma, uncomplicated: Secondary | ICD-10-CM | POA: Diagnosis not present

## 2020-01-02 ENCOUNTER — Other Ambulatory Visit: Payer: Self-pay

## 2020-01-02 ENCOUNTER — Ambulatory Visit (INDEPENDENT_AMBULATORY_CARE_PROVIDER_SITE_OTHER): Payer: Medicare Other | Admitting: Sports Medicine

## 2020-01-02 DIAGNOSIS — M79672 Pain in left foot: Secondary | ICD-10-CM

## 2020-01-02 DIAGNOSIS — M722 Plantar fascial fibromatosis: Secondary | ICD-10-CM

## 2020-01-02 DIAGNOSIS — M79671 Pain in right foot: Secondary | ICD-10-CM

## 2020-01-03 ENCOUNTER — Encounter: Payer: Self-pay | Admitting: Sports Medicine

## 2020-01-03 DIAGNOSIS — M722 Plantar fascial fibromatosis: Secondary | ICD-10-CM | POA: Diagnosis not present

## 2020-01-03 MED ORDER — TRIAMCINOLONE ACETONIDE 40 MG/ML IJ SUSP
20.0000 mg | Freq: Once | INTRAMUSCULAR | Status: AC
  Administered 2020-01-03: 20 mg

## 2020-01-03 NOTE — Progress Notes (Signed)
Subjective: [name] is a 86 y.o. male patient returns to office for follow up evaluation of heel pain on the left and right.  Reports that the right heel is fine but the LEFT HEEL IS STILL SORE, reports that the pain is 5/10 on left. Report that he has been icing and using heel pads but hasn't been stretching. Patient denies any other pedal complaints.   Patient Active Problem List   Diagnosis Date Noted  . DOE (dyspnea on exertion) 10/09/2018  . 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 Chronic prostatitis 07/31/2017  . Memory difficulty 05/15/2017  . Other emphysema ([place]) 07/12/2016  . Essential hypertension 07/12/2016  . Elevated TSH 04/25/2016  . Fatigue 04/25/2016  . Orthostatic hypotension 04/25/2016  . Insomnia 04/17/2016  . Chronic bronchitis ([place]) 12/28/2015  . Benign non-nodular prostatic hyperplasia with lower urinary tract symptoms 11/18/2015  . Restless legs 11/18/2015  . Non-seasonal allergic rhinitis 10/13/2015  . Gastroesophageal reflux disease without esophagitis 08/09/2015  . Abnormal TSH 08/05/2015  . Anemia 08/05/2015  . Hypercalcemia 08/05/2015  . Hyperlipidemia 08/05/2015  . Macrocytosis 08/05/2015  . Nodule of chest wall 08/05/2015  . Dyslipidemia 05/10/2015    Current Outpatient Medications on File Prior to Visit  Medication Sig Dispense Refill  . amLODipine (NORVASC) 2.5 MG tablet Take 1 tablet by mouth daily.     No current facility-administered medications on file prior to visit.    Allergies  Allergen Reactions  . Codeine     SICK  . Penicillins     Objective: Physical Exam General: The patient is alert and oriented x3 in no acute distress.  Dermatology: Skin is warm, dry and supple bilateral lower extremities. Nails 1-10 are normal.  Integument is otherwise unremarkable.  Vascular: Dorsalis Pedis pulse and Posterior Tibial pulse are 1/4 bilateral. Capillary fill time is immediate to all digits.  Neurological: Gross sensation present via light  touch bilateral.  Musculoskeletal: Tenderness to palpation at the medial calcaneal tubercale and through the insertion of the plantar fascia on the left and no reproducible pain to right.  Pes planus foot type.  Strength 5/5 in all groups bilateral.   Assessment and Plan: Problem List Items Addressed This Visit    None    Visit Diagnoses    Plantar fasciitis, bilateral    -  Primary   Bilateral foot pain         -Complete examination performed.  -Previous xrays reviewed -Re-Discussed with patient in detail the condition of plantar fasciitis, how this occurs and general treatment options. Explained both conservative and surgical treatments.  -After oral consent and aseptic prep, injected a mixture containing 1 ml of 2% plain lidocaine, 1 ml 0.5% plain marcaine, 0.5 ml of kenalog 40 and 0.5 ml of dexamethasone phosphate into left heel via a medial approach at the plantar fascial insertion. THIS IS INJECTION #3 TO THE AREA. Post-injection care discussed with patient.  -Recommended good supportive shoes and advised use of heel lifts and recommended a plantar fascial brace but he left before my assistant could come and dispense the brace to him -Educated patient again on proper stretching and advised patient to continue to ice the area 1-2 times daily -Patient to return to office in 1 month for follow-up evaluation or sooner if problems or issues arise.  Landis Martins, DPM

## 2020-01-27 ENCOUNTER — Ambulatory Visit: Payer: Medicare Other | Admitting: Sports Medicine

## 2020-03-16 DIAGNOSIS — G2581 Restless legs syndrome: Secondary | ICD-10-CM | POA: Diagnosis not present

## 2020-03-16 DIAGNOSIS — G4733 Obstructive sleep apnea (adult) (pediatric): Secondary | ICD-10-CM | POA: Diagnosis not present

## 2020-03-16 DIAGNOSIS — J4520 Mild intermittent asthma, uncomplicated: Secondary | ICD-10-CM | POA: Diagnosis not present

## 2020-03-16 DIAGNOSIS — H1011 Acute atopic conjunctivitis, right eye: Secondary | ICD-10-CM | POA: Diagnosis not present

## 2020-03-16 DIAGNOSIS — J301 Allergic rhinitis due to pollen: Secondary | ICD-10-CM | POA: Diagnosis not present

## 2020-03-26 DIAGNOSIS — R5383 Other fatigue: Secondary | ICD-10-CM | POA: Diagnosis not present

## 2020-03-26 DIAGNOSIS — Z23 Encounter for immunization: Secondary | ICD-10-CM | POA: Diagnosis not present

## 2020-03-26 DIAGNOSIS — G4730 Sleep apnea, unspecified: Secondary | ICD-10-CM | POA: Diagnosis not present

## 2020-03-26 DIAGNOSIS — I130 Hypertensive heart and chronic kidney disease with heart failure and stage 1 through stage 4 chronic kidney disease, or unspecified chronic kidney disease: Secondary | ICD-10-CM | POA: Diagnosis not present

## 2020-04-20 DIAGNOSIS — G4733 Obstructive sleep apnea (adult) (pediatric): Secondary | ICD-10-CM | POA: Diagnosis not present

## 2020-04-20 DIAGNOSIS — J301 Allergic rhinitis due to pollen: Secondary | ICD-10-CM | POA: Diagnosis not present

## 2020-04-20 DIAGNOSIS — G2581 Restless legs syndrome: Secondary | ICD-10-CM | POA: Diagnosis not present

## 2020-04-20 DIAGNOSIS — Z23 Encounter for immunization: Secondary | ICD-10-CM | POA: Diagnosis not present

## 2020-04-20 DIAGNOSIS — J4520 Mild intermittent asthma, uncomplicated: Secondary | ICD-10-CM | POA: Diagnosis not present

## 2020-05-03 DIAGNOSIS — Z961 Presence of intraocular lens: Secondary | ICD-10-CM | POA: Diagnosis not present

## 2020-05-24 DIAGNOSIS — R42 Dizziness and giddiness: Secondary | ICD-10-CM | POA: Diagnosis not present

## 2020-05-24 DIAGNOSIS — N1831 Chronic kidney disease, stage 3a: Secondary | ICD-10-CM | POA: Diagnosis not present

## 2020-05-24 DIAGNOSIS — E7800 Pure hypercholesterolemia, unspecified: Secondary | ICD-10-CM | POA: Diagnosis not present

## 2020-05-24 DIAGNOSIS — K219 Gastro-esophageal reflux disease without esophagitis: Secondary | ICD-10-CM | POA: Diagnosis not present

## 2020-05-24 DIAGNOSIS — I130 Hypertensive heart and chronic kidney disease with heart failure and stage 1 through stage 4 chronic kidney disease, or unspecified chronic kidney disease: Secondary | ICD-10-CM | POA: Diagnosis not present

## 2020-05-24 DIAGNOSIS — J439 Emphysema, unspecified: Secondary | ICD-10-CM | POA: Diagnosis not present

## 2020-05-24 DIAGNOSIS — G2581 Restless legs syndrome: Secondary | ICD-10-CM | POA: Diagnosis not present

## 2020-05-24 DIAGNOSIS — J449 Chronic obstructive pulmonary disease, unspecified: Secondary | ICD-10-CM | POA: Diagnosis not present

## 2020-05-24 DIAGNOSIS — G4700 Insomnia, unspecified: Secondary | ICD-10-CM | POA: Diagnosis not present

## 2020-05-24 DIAGNOSIS — G4730 Sleep apnea, unspecified: Secondary | ICD-10-CM | POA: Diagnosis not present

## 2020-05-24 DIAGNOSIS — N400 Enlarged prostate without lower urinary tract symptoms: Secondary | ICD-10-CM | POA: Diagnosis not present

## 2020-05-24 DIAGNOSIS — J45909 Unspecified asthma, uncomplicated: Secondary | ICD-10-CM | POA: Diagnosis not present

## 2020-07-01 DIAGNOSIS — L300 Nummular dermatitis: Secondary | ICD-10-CM | POA: Diagnosis not present

## 2020-08-19 DIAGNOSIS — K3189 Other diseases of stomach and duodenum: Secondary | ICD-10-CM | POA: Diagnosis not present

## 2020-08-19 DIAGNOSIS — R102 Pelvic and perineal pain: Secondary | ICD-10-CM | POA: Diagnosis not present

## 2020-08-19 DIAGNOSIS — K6389 Other specified diseases of intestine: Secondary | ICD-10-CM | POA: Diagnosis not present

## 2020-08-19 DIAGNOSIS — K529 Noninfective gastroenteritis and colitis, unspecified: Secondary | ICD-10-CM | POA: Diagnosis not present

## 2020-08-19 DIAGNOSIS — K862 Cyst of pancreas: Secondary | ICD-10-CM | POA: Diagnosis not present

## 2020-09-01 DIAGNOSIS — K529 Noninfective gastroenteritis and colitis, unspecified: Secondary | ICD-10-CM | POA: Diagnosis not present

## 2020-09-01 DIAGNOSIS — K5730 Diverticulosis of large intestine without perforation or abscess without bleeding: Secondary | ICD-10-CM | POA: Diagnosis not present

## 2020-09-01 DIAGNOSIS — R1032 Left lower quadrant pain: Secondary | ICD-10-CM | POA: Diagnosis not present

## 2020-09-01 DIAGNOSIS — K921 Melena: Secondary | ICD-10-CM | POA: Diagnosis not present

## 2020-09-22 DIAGNOSIS — K5900 Constipation, unspecified: Secondary | ICD-10-CM | POA: Diagnosis not present

## 2020-09-22 DIAGNOSIS — R1033 Periumbilical pain: Secondary | ICD-10-CM | POA: Diagnosis not present

## 2020-09-22 DIAGNOSIS — K5730 Diverticulosis of large intestine without perforation or abscess without bleeding: Secondary | ICD-10-CM | POA: Diagnosis not present

## 2020-10-27 DIAGNOSIS — G2581 Restless legs syndrome: Secondary | ICD-10-CM | POA: Diagnosis not present

## 2020-10-27 DIAGNOSIS — R498 Other voice and resonance disorders: Secondary | ICD-10-CM | POA: Diagnosis not present

## 2020-10-27 DIAGNOSIS — J301 Allergic rhinitis due to pollen: Secondary | ICD-10-CM | POA: Diagnosis not present

## 2020-10-27 DIAGNOSIS — G4733 Obstructive sleep apnea (adult) (pediatric): Secondary | ICD-10-CM | POA: Diagnosis not present

## 2020-10-27 DIAGNOSIS — J4520 Mild intermittent asthma, uncomplicated: Secondary | ICD-10-CM | POA: Diagnosis not present

## 2020-11-03 DIAGNOSIS — G2581 Restless legs syndrome: Secondary | ICD-10-CM | POA: Diagnosis not present

## 2020-11-03 DIAGNOSIS — J301 Allergic rhinitis due to pollen: Secondary | ICD-10-CM | POA: Diagnosis not present

## 2020-11-03 DIAGNOSIS — G4733 Obstructive sleep apnea (adult) (pediatric): Secondary | ICD-10-CM | POA: Diagnosis not present

## 2020-11-03 DIAGNOSIS — J4520 Mild intermittent asthma, uncomplicated: Secondary | ICD-10-CM | POA: Diagnosis not present

## 2020-11-03 DIAGNOSIS — R498 Other voice and resonance disorders: Secondary | ICD-10-CM | POA: Diagnosis not present

## 2020-12-15 DIAGNOSIS — R0600 Dyspnea, unspecified: Secondary | ICD-10-CM | POA: Diagnosis not present

## 2020-12-20 DIAGNOSIS — Z1389 Encounter for screening for other disorder: Secondary | ICD-10-CM | POA: Diagnosis not present

## 2020-12-20 DIAGNOSIS — J449 Chronic obstructive pulmonary disease, unspecified: Secondary | ICD-10-CM | POA: Diagnosis not present

## 2020-12-20 DIAGNOSIS — I130 Hypertensive heart and chronic kidney disease with heart failure and stage 1 through stage 4 chronic kidney disease, or unspecified chronic kidney disease: Secondary | ICD-10-CM | POA: Diagnosis not present

## 2020-12-20 DIAGNOSIS — D692 Other nonthrombocytopenic purpura: Secondary | ICD-10-CM | POA: Diagnosis not present

## 2020-12-20 DIAGNOSIS — J45909 Unspecified asthma, uncomplicated: Secondary | ICD-10-CM | POA: Diagnosis not present

## 2020-12-20 DIAGNOSIS — E7800 Pure hypercholesterolemia, unspecified: Secondary | ICD-10-CM | POA: Diagnosis not present

## 2020-12-20 DIAGNOSIS — J439 Emphysema, unspecified: Secondary | ICD-10-CM | POA: Diagnosis not present

## 2020-12-20 DIAGNOSIS — G454 Transient global amnesia: Secondary | ICD-10-CM | POA: Diagnosis not present

## 2020-12-20 DIAGNOSIS — M810 Age-related osteoporosis without current pathological fracture: Secondary | ICD-10-CM | POA: Diagnosis not present

## 2020-12-20 DIAGNOSIS — Z0000 Encounter for general adult medical examination without abnormal findings: Secondary | ICD-10-CM | POA: Diagnosis not present

## 2020-12-20 DIAGNOSIS — N400 Enlarged prostate without lower urinary tract symptoms: Secondary | ICD-10-CM | POA: Diagnosis not present

## 2020-12-20 DIAGNOSIS — Z23 Encounter for immunization: Secondary | ICD-10-CM | POA: Diagnosis not present

## 2021-01-03 DIAGNOSIS — R498 Other voice and resonance disorders: Secondary | ICD-10-CM | POA: Diagnosis not present

## 2021-01-03 DIAGNOSIS — J301 Allergic rhinitis due to pollen: Secondary | ICD-10-CM | POA: Diagnosis not present

## 2021-01-03 DIAGNOSIS — G4733 Obstructive sleep apnea (adult) (pediatric): Secondary | ICD-10-CM | POA: Diagnosis not present

## 2021-01-03 DIAGNOSIS — J4520 Mild intermittent asthma, uncomplicated: Secondary | ICD-10-CM | POA: Diagnosis not present

## 2021-01-03 DIAGNOSIS — G2581 Restless legs syndrome: Secondary | ICD-10-CM | POA: Diagnosis not present

## 2021-01-10 DIAGNOSIS — G4733 Obstructive sleep apnea (adult) (pediatric): Secondary | ICD-10-CM | POA: Diagnosis not present

## 2021-01-14 DIAGNOSIS — R0602 Shortness of breath: Secondary | ICD-10-CM | POA: Diagnosis not present

## 2021-01-14 DIAGNOSIS — I517 Cardiomegaly: Secondary | ICD-10-CM | POA: Diagnosis not present

## 2021-02-08 DIAGNOSIS — G4733 Obstructive sleep apnea (adult) (pediatric): Secondary | ICD-10-CM | POA: Diagnosis not present

## 2021-02-08 DIAGNOSIS — G2581 Restless legs syndrome: Secondary | ICD-10-CM | POA: Diagnosis not present

## 2021-02-08 DIAGNOSIS — J301 Allergic rhinitis due to pollen: Secondary | ICD-10-CM | POA: Diagnosis not present

## 2021-02-08 DIAGNOSIS — J4520 Mild intermittent asthma, uncomplicated: Secondary | ICD-10-CM | POA: Diagnosis not present

## 2021-02-10 DIAGNOSIS — J302 Other seasonal allergic rhinitis: Secondary | ICD-10-CM | POA: Diagnosis not present

## 2021-02-10 DIAGNOSIS — H8112 Benign paroxysmal vertigo, left ear: Secondary | ICD-10-CM | POA: Diagnosis not present

## 2021-03-31 DIAGNOSIS — M19111 Post-traumatic osteoarthritis, right shoulder: Secondary | ICD-10-CM | POA: Diagnosis not present

## 2021-04-18 DIAGNOSIS — M810 Age-related osteoporosis without current pathological fracture: Secondary | ICD-10-CM | POA: Diagnosis not present

## 2021-04-18 DIAGNOSIS — I130 Hypertensive heart and chronic kidney disease with heart failure and stage 1 through stage 4 chronic kidney disease, or unspecified chronic kidney disease: Secondary | ICD-10-CM | POA: Diagnosis not present

## 2021-04-18 DIAGNOSIS — J449 Chronic obstructive pulmonary disease, unspecified: Secondary | ICD-10-CM | POA: Diagnosis not present

## 2021-04-18 DIAGNOSIS — M25511 Pain in right shoulder: Secondary | ICD-10-CM | POA: Diagnosis not present

## 2021-04-18 DIAGNOSIS — Z23 Encounter for immunization: Secondary | ICD-10-CM | POA: Diagnosis not present

## 2021-04-18 DIAGNOSIS — G4700 Insomnia, unspecified: Secondary | ICD-10-CM | POA: Diagnosis not present

## 2021-04-28 NOTE — Progress Notes (Signed)
Surgery orders requested.

## 2021-04-28 NOTE — Progress Notes (Addendum)
COVID swab appointment:  05-11-21  COVID Vaccine Completed:  Yes x2 Date COVID Vaccine completed: Has received booster: COVID vaccine manufacturer: Moderna     Date of COVID positive in last 33 days:No  PCP - Antony Contras, MD.  Office note on chart  Cardiologist - Jyl Heinz, MD  Pulmonologist - Gardiner Rhyme, MD (req office note)  Chest x-ray - N/A EKG - 04-29-21 Epic Stress Test - greater than 2 years, CEW ECHO - 12-15-20 Epic (done at [facility]) Cardiac Cath - greater than 2 years, CEW Pacemaker/ICD device last checked: Spinal Cord Stimulator:  Sleep Study - Yes, +sleep apnea CPAP - No  Fasting Blood Sugar - N/A Checks Blood Sugar _____ times a day  Blood Thinner Instructions:  N/A Aspirin Instructions: Last Dose:  Activity level:  Can go up a flight of stairs and perform activities of daily living without stopping and without symptoms of chest pain.  Patient states that he does have shortness of breath with climbing a flight of stairs and with exertion. Can make the bed without stopping  Anesthesia review:  COPD, OSA,, HTN  Patient denies shortness of breath, fever, cough and chest pain at PAT appointment   Patient verbalized understanding of instructions that were given to them at the PAT appointment. Patient was also instructed that they will need to review over the PAT instructions again at home before surgery.

## 2021-04-28 NOTE — Patient Instructions (Addendum)
DUE TO COVID-19 ONLY ONE VISITOR IS ALLOWED TO COME WITH YOU AND STAY IN THE WAITING ROOM ONLY DURING PRE OP AND PROCEDURE.   **NO VISITORS ARE ALLOWED IN THE SHORT STAY AREA OR RECOVERY ROOM!!**  IF YOU WILL BE ADMITTED INTO THE HOSPITAL YOU ARE ALLOWED ONLY TWO SUPPORT PEOPLE DURING VISITATION HOURS ONLY (7 AM -8PM)    Up to two visitors ages 82+ are allowed at one time in a patient's room.  The visitors may rotate out with other people throughout the day.  Additionally, up to two children between the ages of 45 and 49 are allowed and do not count toward the number of allowed visitors.  Children within this age range must be accompanied by an adult visitor.  One adult visitor may remain with the patient overnight and must be in the room by 8 PM.  COVID SWAB TESTING MUST BE COMPLETED ON: Wednesday, 05-11-21, Between the hours of 8 and 3  **MUST PRESENT COMPLETED FORM AT TESTING SITE**    [place] (backside of the building)  You are not required to quarantine, however you are required to wear a well-fitted mask when you are out and around people not in your household.  Hand Hygiene often Do NOT share personal items Notify your provider if you are in close contact with someone who has COVID or you develop fever 100.4 or greater, new onset of sneezing, cough, sore throat, shortness of breath or body aches.        Your procedure is scheduled on: Friday, 05-13-21   Report to [facility] Main  Entrance     Report to admitting at 5:15 AM   Call this number if you have problems the morning of surgery [phone]   Do not eat food :After Midnight.   May have liquids until 4:30 AM day of surgery  CLEAR LIQUID DIET  Foods Allowed                                                                     Foods Excluded  Water, Black Coffee (no milk/no creamer) and tea, regular and decaf                              liquids that you cannot  Plain Jell-O in any  flavor  (No red)                         see through such as: Fruit ices (not with fruit pulp)                                 milk, soups, orange juice  Iced Popsicles (No red)                                    All solid food                             Apple juices Sports drinks  like Gatorade (No red) Lightly seasoned clear broth or consume(fat free) Sugar      Oral Hygiene is also important to reduce your risk of infection.                                    Remember - BRUSH YOUR TEETH THE MORNING OF SURGERY WITH YOUR REGULAR TOOTHPASTE   Do NOT smoke after Midnight   Take these medicines the morning of surgery with A SIP OF WATER:  Amlodipine, Zyrtec, Carvedilol, Pravastatin, Tamsulosin.  Okay to use inhalers          Stop all vitamins and herbal supplements a week before surgery             You may not have any metal on your body including  jewelry, and body piercing             Do not wear lotions, powders, cologne, or deodorant              Men may shave face and neck.  Do not bring valuables to the hospital. [facility].   Contacts, dentures or bridgework may not be worn into surgery.   Bring small overnight bag day of surgery.  Please read over the following fact sheets you were given: IF YOU HAVE QUESTIONS ABOUT YOUR PRE OP INSTRUCTIONS PLEASE CALL [facility] - Preparing for Surgery Before surgery, you can play an important role.  Because skin is not sterile, your skin needs to be as free of germs as possible.  You can reduce the number of germs on your skin by washing with CHG (chlorahexidine gluconate) soap before surgery.  CHG is an antiseptic cleaner which kills germs and bonds with the skin to continue killing germs even after washing. Please DO NOT use if you have an allergy to CHG or antibacterial soaps.  If your skin becomes reddened/irritated stop using the CHG and inform your nurse when you arrive at Short  Stay. Do not shave (including legs and underarms) for at least 48 hours prior to the first CHG shower.  You may shave your face/neck.  Please follow these instructions carefully:  1.  Shower with CHG Soap the night before surgery and the  morning of surgery.  2.  If you choose to wash your hair, wash your hair first as usual with your normal  shampoo.  3.  After you shampoo, rinse your hair and body thoroughly to remove the shampoo.                             4.  Use CHG as you would any other liquid soap.  You can apply chg directly to the skin and wash.  Gently with a scrungie or clean washcloth.  5.  Apply the CHG Soap to your body ONLY FROM THE NECK DOWN.   Do   not use on face/ open                           Wound or open sores. Avoid contact with eyes, ears mouth and   genitals (private parts).                       Wash face,  Genitals (private parts) with  your normal soap.             6.  Wash thoroughly, paying special attention to the area where your    surgery  will be performed.  7.  Thoroughly rinse your body with warm water from the neck down.  8.  DO NOT shower/wash with your normal soap after using and rinsing off the CHG Soap.                9.  Pat yourself dry with a clean towel.            10.  Wear clean pajamas.            11.  Place clean sheets on your bed the night of your first shower and do not  sleep with pets. Day of Surgery : Do not apply any lotions/deodorants the morning of surgery.  Please wear clean clothes to the hospital/surgery center.  FAILURE TO FOLLOW THESE INSTRUCTIONS MAY RESULT IN THE CANCELLATION OF YOUR SURGERY  PATIENT SIGNATURE_________________________________  NURSE SIGNATURE__________________________________  ________________________________________________________________________    Adam Phenix  An incentive spirometer is a tool that can help keep your lungs clear and active. This tool measures how well you are filling your  lungs with each breath. Taking long deep breaths may help reverse or decrease the chance of developing breathing (pulmonary) problems (especially infection) following: A long period of time when you are unable to move or be active. BEFORE THE PROCEDURE  If the spirometer includes an indicator to show your best effort, your nurse or respiratory therapist will set it to a desired goal. If possible, sit up straight or lean slightly forward. Try not to slouch. Hold the incentive spirometer in an upright position. INSTRUCTIONS FOR USE  Sit on the edge of your bed if possible, or sit up as far as you can in bed or on a chair. Hold the incentive spirometer in an upright position. Breathe out normally. Place the mouthpiece in your mouth and seal your lips tightly around it. Breathe in slowly and as deeply as possible, raising the piston or the ball toward the top of the column. Hold your breath for 3-5 seconds or for as long as possible. Allow the piston or ball to fall to the bottom of the column. Remove the mouthpiece from your mouth and breathe out normally. Rest for a few seconds and repeat Steps 1 through 7 at least 10 times every 1-2 hours when you are awake. Take your time and take a few normal breaths between deep breaths. The spirometer may include an indicator to show your best effort. Use the indicator as a goal to work toward during each repetition. After each set of 10 deep breaths, practice coughing to be sure your lungs are clear. If you have an incision (the cut made at the time of surgery), support your incision when coughing by placing a pillow or rolled up towels firmly against it. Once you are able to get out of bed, walk around indoors and cough well. You may stop using the incentive spirometer when instructed by your caregiver.  RISKS AND COMPLICATIONS Take your time so you do not get dizzy or light-headed. If you are in pain, you may need to take or ask for pain medication before  doing incentive spirometry. It is harder to take a deep breath if you are having pain. AFTER USE Rest and breathe slowly and easily. It can be helpful to keep track of a log  of your progress. Your caregiver can provide you with a simple table to help with this. If you are using the spirometer at home, follow these instructions: [place] IF:  You are having difficultly using the spirometer. You have trouble using the spirometer as often as instructed. Your pain medication is not giving enough relief while using the spirometer. You develop fever of 100.5 F (38.1 C) or higher. SEEK IMMEDIATE MEDICAL CARE IF:  You cough up bloody sputum that had not been present before. You develop fever of 102 F (38.9 C) or greater. You develop worsening pain at or near the incision site. MAKE SURE YOU:  Understand these instructions. Will watch your condition. Will get help right away if you are not doing well or get worse. Document Released: 10/09/2006 Document Revised: 08/21/2011 Document Reviewed: 12/10/2006 [facility] Patient Information 2014 [place], [place].   ________________________________________________________________________

## 2021-04-29 ENCOUNTER — Encounter (HOSPITAL_COMMUNITY)
Admission: RE | Admit: 2021-04-29 | Discharge: 2021-04-29 | Disposition: A | Discharge status: Home / Self Care | Payer: Medicare Other | Source: Ambulatory Visit | Attending: Orthopedic Surgery | Admitting: Orthopedic Surgery

## 2021-04-29 ENCOUNTER — Encounter (HOSPITAL_COMMUNITY): Payer: Self-pay

## 2021-04-29 ENCOUNTER — Other Ambulatory Visit: Payer: Self-pay

## 2021-04-29 VITALS — BP 127/85 | HR 71 | Temp 98.1°F | Resp 20 | Ht 69.0 in | Wt 170.2 lb

## 2021-04-29 DIAGNOSIS — I2510 Atherosclerotic heart disease of native coronary artery without angina pectoris: Secondary | ICD-10-CM | POA: Insufficient documentation

## 2021-04-29 DIAGNOSIS — Z01818 Encounter for other preprocedural examination: Secondary | ICD-10-CM | POA: Diagnosis not present

## 2021-04-29 DIAGNOSIS — D649 Anemia, unspecified: Secondary | ICD-10-CM | POA: Diagnosis not present

## 2021-04-29 HISTORY — DX: Essential (primary) hypertension: I10

## 2021-04-29 HISTORY — DX: Basal cell carcinoma of skin, unspecified: C44.91

## 2021-04-29 HISTORY — DX: Unspecified osteoarthritis, unspecified site: M19.90

## 2021-04-29 HISTORY — DX: Dyspnea, unspecified: R06.00

## 2021-04-29 LAB — CBC
HCT: 42.6 % (ref 39.0–52.0)
Hemoglobin: 14.9 g/dL (ref 13.0–17.0)
MCH: 32.8 pg (ref 26.0–34.0)
MCHC: 35 g/dL (ref 30.0–36.0)
MCV: 93.8 fL (ref 80.0–100.0)
Platelets: 152 10*3/uL (ref 150–400)
RBC: 4.54 MIL/uL (ref 4.22–5.81)
RDW: 13.3 % (ref 11.5–15.5)
WBC: 6.8 10*3/uL (ref 4.0–10.5)
nRBC: 0 % (ref 0.0–0.2)

## 2021-04-29 LAB — BASIC METABOLIC PANEL
Anion gap: 5 (ref 5–15)
BUN: 13 mg/dL (ref 8–23)
CO2: 27 mmol/L (ref 22–32)
Calcium: 9.7 mg/dL (ref 8.9–10.3)
Chloride: 109 mmol/L (ref 98–111)
Creatinine, Ser: 0.91 mg/dL (ref 0.61–1.24)
GFR, Estimated: >60 mL/min (ref >60)
Glucose, Bld: 101 mg/dL — ABNORMAL HIGH (ref 70–99)
Potassium: 4.2 mmol/L (ref 3.5–5.1)
Sodium: 141 mmol/L (ref 135–145)

## 2021-04-29 LAB — SURGICAL PCR SCREEN
MRSA, PCR: NEGATIVE
Staphylococcus aureus: NEGATIVE

## 2021-05-10 NOTE — H&P (Signed)
Patient's anticipated LOS is less than 2 midnights, meeting these requirements: - Younger than 61 - Lives within 1 hour of care - Has a competent adult at home to recover with post-op recover - NO history of  - Chronic pain requiring opiods  - Diabetes  - Coronary Artery Disease  - Heart failure  - Heart attack  - Stroke  - DVT/VTE  - Cardiac arrhythmia  - Respiratory Failure/COPD  - Renal failure  - Anemia  - Advanced Liver disease     [name] is an 87 y.o. male.    Chief Complaint: right shoulder pain  HPI: Pt is a 87 y.o. male complaining of  right shoulder pain for multiple years. Pain had continually increased since the beginning. X-rays in the clinic show end-stage arthritic changes of the right shoulder . Pt has tried various conservative treatments which have failed to alleviate their symptoms, including injections and therapy. Various options are discussed with the patient. Risks, benefits and expectations were discussed with the patient. Patient understand the risks, benefits and expectations and wishes to proceed with surgery.   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 His smoking use included cigarettes. He has never used smokeless tobacco. He reports that he does not drink alcohol and does not use  drugs.  Allergies:  Allergies  Allergen Reactions   Codeine Nausea And Vomiting    SICK   Penicillins Other (See Comments)    unknown    Medications: No current facility-administered medications for this encounter.   Current Outpatient Medications  Medication Sig Dispense Refill   acetaminophen (TYLENOL) 500 MG tablet Take 500-1,000 mg by mouth every 6 (six) hours as needed (for pain).     albuterol (VENTOLIN HFA) 108 (90 Base) MCG/ACT inhaler Inhale 1-2 puffs into the lungs every 6 (six) hours as needed for wheezing or shortness of breath.     amLODipine (NORVASC) 5 MG tablet Take 5 mg by mouth at bedtime.     budesonide (PULMICORT) 0.5 MG/2ML nebulizer solution Take 0.5 mg by nebulization 2 (two) times daily as needed (wheezing/shortness of breath).     carvedilol (COREG) 6.25 MG tablet Take 6.25 mg by mouth in the morning and at bedtime.     cetirizine (ZYRTEC) 10 MG tablet Take 10 mg by mouth daily.     fluticasone (FLONASE) 50 MCG/ACT nasal spray Place 1 spray into both nostrils in the morning and at bedtime.     naphazoline-pheniramine (ALLERGY EYE) 0.025-0.3 % ophthalmic solution 1-2 drops 4 (four) times daily as needed for eye irritation.     pravastatin (PRAVACHOL) 40 MG tablet Take 40 mg  by mouth in the morning.     tamsulosin (FLOMAX) 0.4 MG CAPS capsule Take 0.4 mg by mouth daily as needed (urinary pain).      No results found for this or any previous visit (from the past 48 hour(s)). No results found.  ROS: Pain with rom of the right upper extremity  Physical Exam: Alert and oriented 87 y.o. male in no acute distress Cranial nerves 2-12 intact Cervical spine: full rom with no tenderness, nv intact distally Chest: active breath sounds bilaterally, no wheeze rhonchi or rales Heart: regular rate and rhythm, no murmur Abd: non tender non distended with active bowel sounds Hip is stable with rom  Right shoulder painful and weak rom Crepitus with rom Nv intact  distally  Assessment/Plan Assessment: right shoulder cuff arthropathy  Plan:  Patient will undergo a right reverse total shoulder by Dr. Veverly Fells at [facility] Risks benefits and expectations were discussed with the patient. Patient understand risks, benefits and expectations and wishes to proceed. Preoperative templating of the joint replacement has been completed, documented, and submitted to the Operating Room personnel in order to optimize intra-operative equipment management.   Merla Riches PA-C, MPAS [facility] is now [employer] [address], [place], [place], [place] [address] Phone: [phone] [email or web address] Facebook  [employer]

## 2021-05-11 ENCOUNTER — Other Ambulatory Visit: Payer: Self-pay | Admitting: Orthopedic Surgery

## 2021-05-11 LAB — SARS CORONAVIRUS 2 (TAT 6-24 HRS): SARS Coronavirus 2: NEGATIVE

## 2021-05-12 NOTE — Anesthesia Preprocedure Evaluation (Addendum)
Anesthesia Evaluation  Patient identified by MRN, date of birth, ID band Patient awake    Reviewed: Allergy & Precautions, NPO status , Patient's Chart, lab work & pertinent test results, reviewed documented beta blocker date and time   History of Anesthesia Complications Negative for: history of anesthetic complications  Airway Mallampati: I  TM Distance: >3 FB Neck ROM: Full    Dental  (+) Edentulous Upper, Edentulous Lower   Pulmonary sleep apnea and Continuous Positive Airway Pressure Ventilation , COPD,  COPD inhaler, former smoker,  05/11/2021 SARS coronavirus NEG   breath sounds clear to auscultation       Cardiovascular hypertension, Pt. on medications and Pt. on home beta blockers (-) angina Rhythm:Regular Rate:Normal  03/2021 ECHO: EF 60-65% with normal LVF, impaired relaxation, trace-mild AI   Neuro/Psych negative neurological ROS     GI/Hepatic negative GI ROS, Neg liver ROS,   Endo/Other  negative endocrine ROS  Renal/GU Renal InsufficiencyRenal disease     Musculoskeletal  (+) Arthritis ,   Abdominal   Peds  Hematology negative hematology ROS (+)   Anesthesia Other Findings   Reproductive/Obstetrics                            Anesthesia Physical Anesthesia Plan  ASA: 3  Anesthesia Plan: General   Post-op Pain Management: Regional block, Dilaudid IV and Tylenol PO (pre-op)   Induction: Intravenous  PONV Risk Score and Plan: 2 and Ondansetron and Dexamethasone  Airway Management Planned: Oral ETT  Additional Equipment: None  Intra-op Plan:   Post-operative Plan: Extubation in OR  Informed Consent: I have reviewed the patients History and Physical, chart, labs and discussed the procedure including the risks, benefits and alternatives for the proposed anesthesia with the patient or authorized representative who has indicated his/her understanding and acceptance.        Plan Discussed with: CRNA and Surgeon  Anesthesia Plan Comments: (Plan routine monitors, GA with interscalene block for post op analgesia)       Anesthesia Quick Evaluation

## 2021-05-13 ENCOUNTER — Encounter (HOSPITAL_COMMUNITY): Payer: Self-pay | Admitting: Orthopedic Surgery

## 2021-05-13 ENCOUNTER — Other Ambulatory Visit: Payer: Self-pay

## 2021-05-13 ENCOUNTER — Observation Stay (HOSPITAL_COMMUNITY): Payer: Medicare Other

## 2021-05-13 ENCOUNTER — Ambulatory Visit (HOSPITAL_COMMUNITY): Payer: Medicare Other | Admitting: Anesthesiology

## 2021-05-13 ENCOUNTER — Observation Stay (HOSPITAL_COMMUNITY)
Admission: RE | Admit: 2021-05-13 | Discharge: 2021-05-14 | Disposition: A | Discharge status: Home / Self Care | Payer: Medicare Other | Attending: Orthopedic Surgery | Admitting: Orthopedic Surgery

## 2021-05-13 ENCOUNTER — Ambulatory Visit (HOSPITAL_COMMUNITY): Payer: Medicare Other | Admitting: Physician Assistant

## 2021-05-13 ENCOUNTER — Encounter (HOSPITAL_COMMUNITY)
Admission: RE | Disposition: A | Discharge status: Home / Self Care | Payer: Self-pay | Source: Home / Self Care | Attending: Orthopedic Surgery

## 2021-05-13 DIAGNOSIS — Z96611 Presence of right artificial shoulder joint: Secondary | ICD-10-CM | POA: Diagnosis not present

## 2021-05-13 DIAGNOSIS — G8918 Other acute postprocedural pain: Secondary | ICD-10-CM | POA: Diagnosis not present

## 2021-05-13 DIAGNOSIS — I509 Heart failure, unspecified: Secondary | ICD-10-CM | POA: Diagnosis not present

## 2021-05-13 DIAGNOSIS — M75101 Unspecified rotator cuff tear or rupture of right shoulder, not specified as traumatic: Secondary | ICD-10-CM | POA: Insufficient documentation

## 2021-05-13 DIAGNOSIS — J449 Chronic obstructive pulmonary disease, unspecified: Secondary | ICD-10-CM | POA: Insufficient documentation

## 2021-05-13 DIAGNOSIS — M19011 Primary osteoarthritis, right shoulder: Secondary | ICD-10-CM | POA: Diagnosis not present

## 2021-05-13 DIAGNOSIS — Z85828 Personal history of other malignant neoplasm of skin: Secondary | ICD-10-CM | POA: Diagnosis not present

## 2021-05-13 DIAGNOSIS — N184 Chronic kidney disease, stage 4 (severe): Secondary | ICD-10-CM | POA: Diagnosis not present

## 2021-05-13 DIAGNOSIS — K219 Gastro-esophageal reflux disease without esophagitis: Secondary | ICD-10-CM | POA: Diagnosis not present

## 2021-05-13 DIAGNOSIS — Z471 Aftercare following joint replacement surgery: Secondary | ICD-10-CM | POA: Diagnosis not present

## 2021-05-13 DIAGNOSIS — I130 Hypertensive heart and chronic kidney disease with heart failure and stage 1 through stage 4 chronic kidney disease, or unspecified chronic kidney disease: Secondary | ICD-10-CM | POA: Insufficient documentation

## 2021-05-13 DIAGNOSIS — D649 Anemia, unspecified: Secondary | ICD-10-CM

## 2021-05-13 DIAGNOSIS — J45909 Unspecified asthma, uncomplicated: Secondary | ICD-10-CM | POA: Diagnosis not present

## 2021-05-13 DIAGNOSIS — M12811 Other specific arthropathies, not elsewhere classified, right shoulder: Secondary | ICD-10-CM | POA: Diagnosis not present

## 2021-05-13 DIAGNOSIS — M75121 Complete rotator cuff tear or rupture of right shoulder, not specified as traumatic: Secondary | ICD-10-CM | POA: Diagnosis not present

## 2021-05-13 DIAGNOSIS — E785 Hyperlipidemia, unspecified: Secondary | ICD-10-CM | POA: Diagnosis not present

## 2021-05-13 DIAGNOSIS — I2510 Atherosclerotic heart disease of native coronary artery without angina pectoris: Secondary | ICD-10-CM

## 2021-05-13 HISTORY — PX: REVERSE SHOULDER ARTHROPLASTY: SHX5054

## 2021-05-13 SURGERY — ARTHROPLASTY, SHOULDER, TOTAL, REVERSE
Anesthesia: General | Site: Shoulder | Laterality: Right

## 2021-05-13 MED ORDER — ACETAMINOPHEN 500 MG PO TABS
1000.0000 mg | ORAL_TABLET | Freq: Once | ORAL | Status: AC
  Administered 2021-05-13: 1000 mg via ORAL
  Filled 2021-05-13: qty 2

## 2021-05-13 MED ORDER — ACETAMINOPHEN 500 MG PO TABS: 500.0000 mg | ORAL_TABLET | Freq: Four times a day (QID) | ORAL | Status: DC | PRN

## 2021-05-13 MED ORDER — ROCURONIUM BROMIDE 10 MG/ML (PF) SYRINGE
PREFILLED_SYRINGE | INTRAVENOUS | Status: AC
  Filled 2021-05-13: qty 10

## 2021-05-13 MED ORDER — ONDANSETRON HCL 4 MG/2ML IJ SOLN
INTRAMUSCULAR | Status: DC | PRN
  Administered 2021-05-13: 4 mg via INTRAVENOUS

## 2021-05-13 MED ORDER — NAPHAZOLINE-PHENIRAMINE 0.025-0.3 % OP SOLN
1.0000 [drp] | Freq: Four times a day (QID) | OPHTHALMIC | Status: DC | PRN
  Filled 2021-05-13: qty 15

## 2021-05-13 MED ORDER — LORATADINE 10 MG PO TABS
10.0000 mg | ORAL_TABLET | Freq: Every day | ORAL | Status: DC
  Administered 2021-05-13 – 2021-05-14 (×2): 10 mg via ORAL
  Filled 2021-05-13 (×2): qty 1

## 2021-05-13 MED ORDER — HYDROCODONE-ACETAMINOPHEN 7.5-325 MG PO TABS: 1.0000 | ORAL_TABLET | ORAL | Status: DC | PRN

## 2021-05-13 MED ORDER — ORAL CARE MOUTH RINSE: 15.0000 mL | Freq: Once | OROMUCOSAL | Status: AC

## 2021-05-13 MED ORDER — BUPIVACAINE-EPINEPHRINE (PF) 0.5% -1:200000 IJ SOLN
INTRAMUSCULAR | Status: DC | PRN
  Administered 2021-05-13: 10 mL via PERINEURAL

## 2021-05-13 MED ORDER — STERILE WATER FOR IRRIGATION IR SOLN
Status: DC | PRN
  Administered 2021-05-13: 2000 mL

## 2021-05-13 MED ORDER — DOCUSATE SODIUM 100 MG PO CAPS
100.0000 mg | ORAL_CAPSULE | Freq: Two times a day (BID) | ORAL | Status: DC
  Administered 2021-05-13 – 2021-05-14 (×2): 100 mg via ORAL
  Filled 2021-05-13 (×2): qty 1

## 2021-05-13 MED ORDER — BUPIVACAINE LIPOSOME 1.3 % IJ SUSP
INTRAMUSCULAR | Status: DC | PRN
  Administered 2021-05-13: 10 mL via PERINEURAL

## 2021-05-13 MED ORDER — MENTHOL 3 MG MT LOZG: 1.0000 | LOZENGE | OROMUCOSAL | Status: DC | PRN

## 2021-05-13 MED ORDER — DEXAMETHASONE SODIUM PHOSPHATE 10 MG/ML IJ SOLN
INTRAMUSCULAR | Status: AC
  Filled 2021-05-13: qty 1

## 2021-05-13 MED ORDER — PROPOFOL 10 MG/ML IV BOLUS
INTRAVENOUS | Status: AC
  Filled 2021-05-13: qty 20

## 2021-05-13 MED ORDER — ONDANSETRON HCL 4 MG PO TABS: 4.0000 mg | ORAL_TABLET | Freq: Four times a day (QID) | ORAL | Status: DC | PRN

## 2021-05-13 MED ORDER — ONDANSETRON HCL 4 MG/2ML IJ SOLN
INTRAMUSCULAR | Status: AC
  Filled 2021-05-13: qty 2

## 2021-05-13 MED ORDER — METOCLOPRAMIDE HCL 5 MG/ML IJ SOLN: 5.0000 mg | Freq: Three times a day (TID) | INTRAMUSCULAR | Status: DC | PRN

## 2021-05-13 MED ORDER — BISACODYL 10 MG RE SUPP: 10.0000 mg | Freq: Every day | RECTAL | Status: DC | PRN

## 2021-05-13 MED ORDER — PHENYLEPHRINE HCL-NACL 20-0.9 MG/250ML-% IV SOLN
INTRAVENOUS | Status: DC | PRN
  Administered 2021-05-13: 100 ug/min via INTRAVENOUS

## 2021-05-13 MED ORDER — ROCURONIUM BROMIDE 10 MG/ML (PF) SYRINGE
PREFILLED_SYRINGE | INTRAVENOUS | Status: DC | PRN
  Administered 2021-05-13: 60 mg via INTRAVENOUS

## 2021-05-13 MED ORDER — LACTATED RINGERS IV SOLN: INTRAVENOUS | Status: DC

## 2021-05-13 MED ORDER — TRAMADOL HCL 50 MG PO TABS
50.0000 mg | ORAL_TABLET | Freq: Four times a day (QID) | ORAL | Status: DC | PRN
  Administered 2021-05-13: 50 mg via ORAL
  Filled 2021-05-13: qty 1

## 2021-05-13 MED ORDER — TRAMADOL HCL 50 MG PO TABS: 50.0000 mg | ORAL_TABLET | Freq: Four times a day (QID) | ORAL | 0 refills | Status: DC | PRN

## 2021-05-13 MED ORDER — CEFAZOLIN SODIUM-DEXTROSE 2-4 GM/100ML-% IV SOLN
2.0000 g | INTRAVENOUS | Status: AC
  Administered 2021-05-13: 2 g via INTRAVENOUS
  Filled 2021-05-13: qty 100

## 2021-05-13 MED ORDER — SUGAMMADEX SODIUM 200 MG/2ML IV SOLN
INTRAVENOUS | Status: DC | PRN
  Administered 2021-05-13: 200 mg via INTRAVENOUS

## 2021-05-13 MED ORDER — METOCLOPRAMIDE HCL 5 MG PO TABS
5.0000 mg | ORAL_TABLET | Freq: Three times a day (TID) | ORAL | Status: DC | PRN
  Administered 2021-05-14: 10 mg via ORAL
  Filled 2021-05-13: qty 2

## 2021-05-13 MED ORDER — PHENOL 1.4 % MT LIQD: 1.0000 | OROMUCOSAL | Status: DC | PRN

## 2021-05-13 MED ORDER — PHENYLEPHRINE HCL (PRESSORS) 10 MG/ML IV SOLN
INTRAVENOUS | Status: AC
  Filled 2021-05-13: qty 1

## 2021-05-13 MED ORDER — DEXAMETHASONE SODIUM PHOSPHATE 10 MG/ML IJ SOLN
INTRAMUSCULAR | Status: DC | PRN
  Administered 2021-05-13: 8 mg via INTRAVENOUS

## 2021-05-13 MED ORDER — AMLODIPINE BESYLATE 5 MG PO TABS: 5.0000 mg | ORAL_TABLET | Freq: Every day | ORAL | Status: DC

## 2021-05-13 MED ORDER — PRAVASTATIN SODIUM 20 MG PO TABS
40.0000 mg | ORAL_TABLET | Freq: Every day | ORAL | Status: DC
  Administered 2021-05-14: 40 mg via ORAL
  Filled 2021-05-13: qty 2

## 2021-05-13 MED ORDER — BUPIVACAINE-EPINEPHRINE (PF) 0.25% -1:200000 IJ SOLN
INTRAMUSCULAR | Status: AC
  Filled 2021-05-13: qty 30

## 2021-05-13 MED ORDER — METHOCARBAMOL 1000 MG/10ML IJ SOLN
500.0000 mg | Freq: Four times a day (QID) | INTRAVENOUS | Status: DC | PRN
  Filled 2021-05-13: qty 5

## 2021-05-13 MED ORDER — CHLORHEXIDINE GLUCONATE 0.12 % MT SOLN
15.0000 mL | Freq: Once | OROMUCOSAL | Status: AC
  Administered 2021-05-13: 15 mL via OROMUCOSAL

## 2021-05-13 MED ORDER — SODIUM CHLORIDE 0.9 % IV SOLN: INTRAVENOUS | Status: DC

## 2021-05-13 MED ORDER — FENTANYL CITRATE PF 50 MCG/ML IJ SOSY: 25.0000 ug | PREFILLED_SYRINGE | INTRAMUSCULAR | Status: DC | PRN

## 2021-05-13 MED ORDER — CEFAZOLIN SODIUM-DEXTROSE 2-4 GM/100ML-% IV SOLN
2.0000 g | Freq: Four times a day (QID) | INTRAVENOUS | Status: AC
  Administered 2021-05-13 – 2021-05-14 (×3): 2 g via INTRAVENOUS
  Filled 2021-05-13 (×3): qty 100

## 2021-05-13 MED ORDER — ONDANSETRON HCL 4 MG/2ML IJ SOLN
4.0000 mg | Freq: Four times a day (QID) | INTRAMUSCULAR | Status: DC | PRN
  Administered 2021-05-14: 4 mg via INTRAVENOUS
  Filled 2021-05-13: qty 2

## 2021-05-13 MED ORDER — TAMSULOSIN HCL 0.4 MG PO CAPS: 0.4000 mg | ORAL_CAPSULE | Freq: Every day | ORAL | Status: DC | PRN

## 2021-05-13 MED ORDER — METHOCARBAMOL 500 MG PO TABS
500.0000 mg | ORAL_TABLET | Freq: Four times a day (QID) | ORAL | Status: DC | PRN
  Administered 2021-05-13: 500 mg via ORAL
  Filled 2021-05-13: qty 1

## 2021-05-13 MED ORDER — CARVEDILOL 6.25 MG PO TABS
6.2500 mg | ORAL_TABLET | Freq: Two times a day (BID) | ORAL | Status: DC
  Administered 2021-05-13 – 2021-05-14 (×2): 6.25 mg via ORAL
  Filled 2021-05-13 (×2): qty 1

## 2021-05-13 MED ORDER — BUPIVACAINE-EPINEPHRINE (PF) 0.25% -1:200000 IJ SOLN
INTRAMUSCULAR | Status: DC | PRN
  Administered 2021-05-13: 13 mL

## 2021-05-13 MED ORDER — ALBUTEROL SULFATE HFA 108 (90 BASE) MCG/ACT IN AERS
1.0000 | INHALATION_SPRAY | Freq: Four times a day (QID) | RESPIRATORY_TRACT | Status: DC | PRN
Start: 2021-05-13 — End: 2021-05-13

## 2021-05-13 MED ORDER — PROPOFOL 10 MG/ML IV BOLUS
INTRAVENOUS | Status: DC | PRN
  Administered 2021-05-13: 120 mg via INTRAVENOUS

## 2021-05-13 MED ORDER — 0.9 % SODIUM CHLORIDE (POUR BTL) OPTIME
TOPICAL | Status: DC | PRN
  Administered 2021-05-13: 1000 mL

## 2021-05-13 MED ORDER — BUDESONIDE 0.5 MG/2ML IN SUSP: 0.5000 mg | Freq: Two times a day (BID) | RESPIRATORY_TRACT | Status: DC | PRN

## 2021-05-13 MED ORDER — FENTANYL CITRATE (PF) 100 MCG/2ML IJ SOLN
INTRAMUSCULAR | Status: AC
  Filled 2021-05-13: qty 2

## 2021-05-13 MED ORDER — MORPHINE SULFATE (PF) 2 MG/ML IV SOLN: 0.5000 mg | INTRAVENOUS | Status: DC | PRN

## 2021-05-13 MED ORDER — FENTANYL CITRATE (PF) 100 MCG/2ML IJ SOLN
INTRAMUSCULAR | Status: DC | PRN
  Administered 2021-05-13 (×4): 25 ug via INTRAVENOUS

## 2021-05-13 MED ORDER — FLUTICASONE PROPIONATE 50 MCG/ACT NA SUSP
1.0000 | Freq: Two times a day (BID) | NASAL | Status: DC
  Administered 2021-05-13: 1 via NASAL
  Filled 2021-05-13: qty 16

## 2021-05-13 MED ORDER — ACETAMINOPHEN 325 MG PO TABS: 325.0000 mg | ORAL_TABLET | Freq: Four times a day (QID) | ORAL | Status: DC | PRN

## 2021-05-13 MED ORDER — GLYCOPYRROLATE 0.2 MG/ML IJ SOLN
INTRAMUSCULAR | Status: DC | PRN
  Administered 2021-05-13: .2 mg via INTRAVENOUS

## 2021-05-13 MED ORDER — ALBUTEROL SULFATE (2.5 MG/3ML) 0.083% IN NEBU: 2.5000 mg | INHALATION_SOLUTION | Freq: Four times a day (QID) | RESPIRATORY_TRACT | Status: DC | PRN

## 2021-05-13 MED ORDER — EPHEDRINE SULFATE-NACL 50-0.9 MG/10ML-% IV SOSY
PREFILLED_SYRINGE | INTRAVENOUS | Status: DC | PRN
  Administered 2021-05-13: 5 mg via INTRAVENOUS
  Administered 2021-05-13: 10 mg via INTRAVENOUS
  Administered 2021-05-13: 5 mg via INTRAVENOUS

## 2021-05-13 SURGICAL SUPPLY — 67 items
BAG COUNTER SPONGE SURGICOUNT (BAG) IMPLANT
BAG ZIPLOCK 12X15 (MISCELLANEOUS) IMPLANT
BIT DRILL 1.6MX128 (BIT) IMPLANT
BIT DRILL 170X2.5X (BIT) ×1 IMPLANT
BIT DRL 170X2.5X (BIT) ×1
BLADE SAG 18X100X1.27 (BLADE) ×2 IMPLANT
COVER BACK TABLE 60X90IN (DRAPES) ×2 IMPLANT
COVER SURGICAL LIGHT HANDLE (MISCELLANEOUS) ×2 IMPLANT
CUP HUMERAL 42 PLUS 3 (Orthopedic Implant) ×2 IMPLANT
DECANTER SPIKE VIAL GLASS SM (MISCELLANEOUS) ×2 IMPLANT
DRAPE INCISE IOBAN 66X45 STRL (DRAPES) ×2 IMPLANT
DRAPE ORTHO SPLIT 77X108 STRL (DRAPES) ×2
DRAPE SHEET LG 3/4 BI-LAMINATE (DRAPES) ×2 IMPLANT
DRAPE SURG ORHT 6 SPLT 77X108 (DRAPES) ×2 IMPLANT
DRAPE TOP 10253 STERILE (DRAPES) ×2 IMPLANT
DRAPE U-SHAPE 47X51 STRL (DRAPES) ×2 IMPLANT
DRILL 2.5 (BIT) ×1
DRSG ADAPTIC 3X8 NADH LF (GAUZE/BANDAGES/DRESSINGS) ×2 IMPLANT
DRSG PAD ABDOMINAL 8X10 ST (GAUZE/BANDAGES/DRESSINGS) ×2 IMPLANT
DURAPREP 26ML APPLICATOR (WOUND CARE) ×2 IMPLANT
ELECT BLADE TIP CTD 4 INCH (ELECTRODE) ×2 IMPLANT
ELECT NEEDLE TIP 2.8 STRL (NEEDLE) ×2 IMPLANT
ELECT REM PT RETURN 15FT ADLT (MISCELLANEOUS) ×2 IMPLANT
EPIPHYSI RIGHT SZ 2 (Shoulder) ×2 IMPLANT
EPIPHYSIS RIGHT SZ 2 (Shoulder) ×1 IMPLANT
FACESHIELD WRAPAROUND (MASK) ×2 IMPLANT
GAUZE SPONGE 4X4 12PLY STRL (GAUZE/BANDAGES/DRESSINGS) ×2 IMPLANT
GLENOSPHERE XTEND LAT 42+0 STD (Miscellaneous) ×2 IMPLANT
GLOVE SURG ORTHO LTX SZ7.5 (GLOVE) ×2 IMPLANT
GLOVE SURG ORTHO LTX SZ8.5 (GLOVE) ×2 IMPLANT
GLOVE SURG UNDER POLY LF SZ7.5 (GLOVE) ×2 IMPLANT
GLOVE SURG UNDER POLY LF SZ8.5 (GLOVE) ×2 IMPLANT
GOWN STRL REUS W/TWL XL LVL3 (GOWN DISPOSABLE) ×4 IMPLANT
KIT BASIN OR (CUSTOM PROCEDURE TRAY) ×2 IMPLANT
KIT TURNOVER KIT A (KITS) IMPLANT
MANIFOLD NEPTUNE II (INSTRUMENTS) ×2 IMPLANT
METAGLENE DELTA EXTEND (Trauma) ×1 IMPLANT
METAGLENE DXTEND (Trauma) ×2 IMPLANT
NEEDLE MAYO CATGUT SZ4 (NEEDLE) IMPLANT
NS IRRIG 1000ML POUR BTL (IV SOLUTION) ×2 IMPLANT
PACK SHOULDER (CUSTOM PROCEDURE TRAY) ×2 IMPLANT
PIN GUIDE 1.2 (PIN) ×2 IMPLANT
PIN GUIDE GLENOPHERE 1.5MX300M (PIN) ×2 IMPLANT
PIN METAGLENE 2.5 (PIN) ×2 IMPLANT
PROTECTOR NERVE ULNAR (MISCELLANEOUS) ×2 IMPLANT
RESTRAINT HEAD UNIVERSAL NS (MISCELLANEOUS) ×2 IMPLANT
SCREW 4.5X18MM (Screw) ×1 IMPLANT
SCREW 4.5X36MM (Screw) ×2 IMPLANT
SCREW BN 18X4.5XSTRL SHLDR (Screw) ×1 IMPLANT
SCREW LOCK 42 (Screw) ×2 IMPLANT
SLING ARM FOAM STRAP LRG (SOFTGOODS) ×2 IMPLANT
SMARTMIX MINI TOWER (MISCELLANEOUS)
SPONGE T-LAP 4X18 ~~LOC~~+RFID (SPONGE) IMPLANT
STEM 12 HA (Stem) ×2 IMPLANT
STRIP CLOSURE SKIN 1/2X4 (GAUZE/BANDAGES/DRESSINGS) ×2 IMPLANT
SUCTION FRAZIER HANDLE 10FR (MISCELLANEOUS) ×1
SUCTION TUBE FRAZIER 10FR DISP (MISCELLANEOUS) ×1 IMPLANT
SUT FIBERWIRE #2 38 T-5 BLUE (SUTURE) ×4
SUT MNCRL AB 4-0 PS2 18 (SUTURE) ×2 IMPLANT
SUT VIC AB 0 CT1 36 (SUTURE) ×4 IMPLANT
SUT VIC AB 0 CT2 27 (SUTURE) ×2 IMPLANT
SUT VIC AB 2-0 CT1 27 (SUTURE) ×1
SUT VIC AB 2-0 CT1 TAPERPNT 27 (SUTURE) ×1 IMPLANT
SUTURE FIBERWR #2 38 T-5 BLUE (SUTURE) ×2 IMPLANT
TAPE CLOTH SURG 6X10 WHT LF (GAUZE/BANDAGES/DRESSINGS) ×2 IMPLANT
TOWEL OR 17X26 10 PK STRL BLUE (TOWEL DISPOSABLE) ×2 IMPLANT
TOWER SMARTMIX MINI (MISCELLANEOUS) IMPLANT

## 2021-05-13 NOTE — Evaluation (Signed)
Occupational Therapy Evaluation Patient Details Name: [name] MRN: [id number] DOB: 12/26/1932 Today's Date: 05/13/2021   History of Present Illness 87 yo M s/p R TSA - Reverse.  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  L RCR   Clinical Impression   Patient admitted for the procedure above. 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  He generally walks without an AD, and is able to complete all ADL/IADL without assist.  He continues to drive.  Deficits impacting independence are listed below.  Currently his UE block is in place, HEP sheet reviewed, precautions reviewed, sling management reviewed; but all will need to be reviewed, plus ADL completion prior to home.  Plan is discharge tomorrow if cleared medically.  OT will follow in the acute setting.         Recommendations for follow up therapy are one component of a multi-disciplinary discharge planning process, led by the attending physician.  Recommendations may be updated based on patient status, additional functional criteria and insurance authorization.   Follow Up Recommendations  Follow physician's recommendations for discharge plan and follow up therapies    Assistance Recommended at Discharge Set up Supervision/Assistance  Functional Status Assessment  Patient has had a recent decline in their functional status and demonstrates the ability to make significant improvements in function in a reasonable and predictable amount of time.  Equipment Recommendations       Recommendations for Other Services       Precautions / Restrictions Precautions Precautions: Shoulder Type of Shoulder Precautions: No A/PROM to R shoulder. Shoulder Interventions: Shoulder  sling/immobilizer;Off for dressing/bathing/exercises Precaution Booklet Issued: Yes (comment) Precaution Comments: Reviewed Required Braces or Orthoses: Sling Restrictions Weight Bearing Restrictions: Yes RUE Weight Bearing: Non weight bearing      Mobility Bed Mobility Overal bed mobility: Needs Assistance Bed Mobility: Supine to Sit;Sit to Supine     Supine to sit: Supervision Sit to supine: Min guard        Transfers Overall transfer level: Needs assistance   Transfers: Sit to/from Stand Sit to Stand: Min guard                  Balance Overall balance assessment: Needs assistance Sitting-balance support: Feet supported Sitting balance-Leahy Scale: Good     Standing balance support: Single extremity supported Standing balance-Leahy Scale: Fair Standing balance comment: nausea noted - a little unstable                           ADL either performed or assessed with clinical judgement   ADL                   Upper Body Dressing : Moderate assistance;Sitting   Lower Body Dressing: Moderate assistance;Sit to/from stand   Toilet Transfer: Minimal assistance;Ambulation;Regular Toilet                   Vision Patient Visual Report: No change from baseline       Perception Perception Perception: Not tested   Praxis Praxis Praxis: Not tested    Pertinent Vitals/Pain Pain  Assessment: No/denies pain     Hand Dominance Right   Extremity/Trunk Assessment Upper Extremity Assessment Upper Extremity Assessment: RUE deficits/detail RUE Deficits / Details: Block in place.  TSA RUE Sensation: decreased light touch;decreased proprioception RUE Coordination: decreased fine motor;decreased gross motor   Lower Extremity Assessment Lower Extremity Assessment: Overall WFL for tasks assessed   Cervical / Trunk Assessment Cervical / Trunk Assessment: Kyphotic   Communication Communication Communication: No difficulties    Cognition Arousal/Alertness: Awake/alert Behavior During Therapy: WFL for tasks assessed/performed Overall Cognitive Status: Within Functional Limits for tasks assessed                                 General Comments: baseline memory impariment     General Comments       Exercises     Shoulder Instructions      Home Living Family/patient expects to be discharged to:: Private residence Living Arrangements: Spouse/significant other Available Help at Discharge: Family;Available 24 hours/day Type of Home: House Home Access: Stairs to enter [employer] of Steps: 2 from the carport Entrance Stairs-Rails: None Home Layout: Laundry or work area in basement;Able to live on main level with bedroom/bathroom     Bathroom Shower/Tub: Teacher, early years/pre: Standard Bathroom Accessibility: Yes How Accessible: Accessible via walker Home Equipment: Shower seat          Prior Functioning/Environment Prior Level of Function : Independent/Modified Independent             Mobility Comments: No AD needed.  Active, continues to mow grass and drives ADLs Comments: No assist for ADL/IADL.  Spouse had a CVA with balance deficits.  Patient completes the majority of IADL.  Does not need to pyhsically assist his wife.        OT Problem List: Decreased range of motion;Impaired balance (sitting and/or standing)      OT Treatment/Interventions: Self-care/ADL training;Therapeutic exercise;Balance training;Therapeutic activities    OT Goals(Current goals can be found in the care plan section) Acute Rehab OT Goals Patient Stated Goal: Hoping to go home tomorrow OT Goal Formulation: With patient Time For Goal Achievement: 05/27/21 Potential to Achieve Goals: Good  OT Frequency: Min 2X/week   Barriers to D/C:    none noted       Co-evaluation              AM-PAC OT "6 Clicks" Daily Activity     Outcome Measure Help from another person  eating meals?: None Help from another person taking care of personal grooming?: None Help from another person toileting, which includes using toliet, bedpan, or urinal?: A Little Help from another person bathing (including washing, rinsing, drying)?: A Lot Help from another person to put on and taking off regular upper body clothing?: A Lot Help from another person to put on and taking off regular lower body clothing?: A Little 6 Click Score: 18   End of Session Nurse Communication: Mobility status  Activity Tolerance: Patient tolerated treatment well Patient left: in bed;with call bell/phone within reach  OT Visit Diagnosis: Unsteadiness on feet (R26.81);Pain Pain - Right/Left: Right Pain - part of body: Shoulder                Time: 4970-2637 OT Time Calculation (min): 21 min Charges:  OT General Charges $OT Visit: 1 Visit OT Evaluation $OT Eval Moderate Complexity: 1 Mod  05/13/2021  RP, OTR/L  Acute Rehabilitation Services  Office:  [facility] 05/13/2021, 3:31 PM

## 2021-05-13 NOTE — Transfer of Care (Signed)
Immediate Anesthesia Transfer of Care Note  Patient: [name]  Procedure(s) Performed: REVERSE SHOULDER ARTHROPLASTY (Right: Shoulder)  Patient Location: PACU  Anesthesia Type:GA combined with regional for post-op pain  Level of Consciousness: awake, alert , oriented and patient cooperative  Airway & Oxygen Therapy: Patient Spontanous Breathing and Patient connected to face mask oxygen  Post-op Assessment: Report given to RN and Post -op Vital signs reviewed and stable  Post vital signs: Reviewed and stable  Last Vitals:  Vitals Value Taken Time  BP 150/68 05/13/21 0908  Temp    Pulse 73 05/13/21 0910  Resp 21 05/13/21 0910  SpO2 100 % 05/13/21 0910  Vitals shown include unvalidated device data.  Last Pain:  Vitals:   05/13/21 0551  TempSrc:   PainSc: 0-No pain      Patients Stated Pain Goal: 3 (42/59/56 3875)  Complications: No notable events documented.

## 2021-05-13 NOTE — Interval H&P Note (Signed)
History and Physical Interval Note:  05/13/2021 7:23 AM  [name]  has presented today for surgery, with the diagnosis of right shoulder rotator cuff arthropathy.  The various methods of treatment have been discussed with the patient and family. After consideration of risks, benefits and other options for treatment, the patient has consented to  Procedure(s) with comments: REVERSE SHOULDER ARTHROPLASTY (Right) - with ISB as a surgical intervention.  The patient's history has been reviewed, patient examined, no change in status, stable for surgery.  I have reviewed the patient's chart and labs.  Questions were answered to the patient's satisfaction.     [name]

## 2021-05-13 NOTE — Op Note (Signed)
NAMEWILBERTO, [name] MEDICAL RECORD NO: [id number] ACCOUNT NO: [id number] DATE OF BIRTH: [birth year] FACILITY: [facility] LOCATION: [facility] PHYSICIAN: Doran Heater. Veverly Fells, MD  Operative Report   DATE OF PROCEDURE: 05/13/2021  PREOPERATIVE DIAGNOSIS:  End-stage arthritis/rotator cuff tear arthropathy, right shoulder.  POSTOPERATIVE DIAGNOSIS:  End-stage arthritis/rotator cuff tear arthropathy, right shoulder.  PROCEDURE PERFORMED:  Right reverse shoulder placement using DePuy Delta Xtend prosthesis, no subscap repair.  ATTENDING SURGEON:  Doran Heater. Veverly Fells, MD  ASSISTANT:  Charletta Cousin [place], [place], who was scrubbed during the entire procedure and necessary for satisfactory completion of surgery.  ANESTHESIA:  General anesthesia was used plus interscalene block.  ESTIMATED BLOOD LOSS:  100 mL.  FLUID REPLACEMENT:  1500 mL of crystalloid.  COUNTS:  Instrument counts correct.  COMPLICATIONS:  No complications.   ANTIBIOTICS:  Perioperative antibiotics were given.  INDICATIONS:  The patient is an 87 year old male who presents with worsening right shoulder dysfunction threatening his independence.  The patient has had progressive pain as well despite conservative management.  We discussed all options of treatment to  include continued conservative management versus surgery, which would include reverse shoulder replacement to restore fixed focal mechanics and decent function for the patient's right shoulder. Informed consent was obtained.  DESCRIPTION OF PROCEDURE:  After an adequate level of anesthesia was achieved, the patient was positioned in the modified beach chair position.  Right shoulder was correctly identified and sterilely prepped and draped in the usual manner.  Timeout was  called verifying correct patient, correct site. We entered the patient's shoulder using a standard deltopectoral incision starting at the coracoid process extending down to the anterior humerus.  Dissection down  through subcutaneous tissues using Bovie.   We identified the cephalic vein and took that laterally with the deltoid.  Pectoralis was taken medially.  Conjoined tendon was identified and retracted medially.  Deep retractors were placed.  We identified the biceps tendon and whipstitched that with  basically an in-situ tenodesis with 0 Vicryl figure-of-eight suture x2 incorporating part of the pec tendon.  We then released the subscapularis subperiosteally off the lesser tuberosity.  We tagged for protection of the axillary nerve.  Given the  patient's age and condition of the subscap, we did not repair the subscap today.  We then released the inferior capsule extending the shoulder and delivering the humeral head out of the wound.  We entered the proximal humerus with a 6 mm reamer. There  was end-stage arthritis noted.  We reamed up to a size 12.  We then placed our T-handle 12 guide and resected the head at 20 degrees of retroversion.  We removed excess osteophytes with a rongeur, subluxed the humerus posteriorly, gaining good access to  the glenoid.  We did a capsule labral excision.  We identified the center point for the guide pin.  We removed the cartilage with a Cobb elevator.  There was a little bit of cartilage still in the glenoid.  We then placed our pin with the drill and then  reamed for the metaglene baseplate.  We then drilled our central peg hole and impacted the metaglene into position.  This was centered low on the glenoid face.  We then placed a 42 screw inferiorly, a 36 at the base of the coracoid, and an 18 screw  posteriorly.  We locked the superior and inferior screws.  We had good baseplate support and security.  We then placed a 42 standard glenosphere on the baseplate and secured it.  I did  a finger sweep to make sure there was no soft tissue caught up in  that bearing.  We then irrigated thoroughly and then went back to the humeral side, reaming for to right metaphysis.  We then  placed the trial stem in, which was a 12 stem with 2 right set on the 0 setting and placed in 20 degrees of retroversion.  We  impacted that in.  We selected a 42+3 standard polyethylene bearing, placed that on the humeral tray, and reduced the shoulder.  We had good soft tissue balance and tension with appropriate stability both in external rotation and neutral within the  inferior pole.  There was no gapping.  Everything moving together as a unit.  We went ahead and removed all the trial components, and at this point, irrigated thoroughly and then used HA coated press-fit components with the 12 stem and 2 right  metaphysis, again set on the 0 setting and impacted in 20 degrees of retroversion with available bone graft from the humeral head.  We had a good secure stem.  We selected the real 42+3 poly, placed on the humeral tray and reduced the shoulder.  We were  pleased with our soft tissue tension and balance and stability.  We irrigated thoroughly, resected the subscap remnant, and then repaired deltopectoral interval with 0 Vicryl suture followed by 2-0 Vicryl for subcutaneous closure and 4-0 Monocryl for  skin.  Steri-Strips were applied followed by sterile dressing.  The patient tolerated the surgery well.   [facility] D: 05/13/2021 9:16:40 am T: 05/13/2021 1:59:00 pm  JOB: [id number]/ [id number]

## 2021-05-13 NOTE — Discharge Instructions (Signed)
Ice to the shoulder constantly.  Keep the incision covered and clean and dry for one week, then ok to get it wet in the shower. ° °Do exercise as instructed several times per day. ° °DO NOT reach behind your back or push up out of a chair with the operative arm. ° °Use a sling while you are up and around for comfort, may remove while seated.  Keep pillow propped behind the operative elbow. ° °Follow up with Dr Kyan Giannone in two weeks in the office, call [phone] for appt °

## 2021-05-13 NOTE — Brief Op Note (Signed)
05/13/2021  9:06 AM  PATIENT:  Dan Maker  87 y.o. male  PRE-OPERATIVE DIAGNOSIS:  right shoulder rotator cuff arthropathy  POST-OPERATIVE DIAGNOSIS:  right shoulder rotator cuff arthropathy  PROCEDURE:  Procedure(s) with comments: REVERSE SHOULDER ARTHROPLASTY (Right) - with ISB DePuy Delta Xtend, No subscap repair  SURGEON:  Surgeon(s) and Role:    [name], MD - Primary  PHYSICIAN ASSISTANT:   ASSISTANTS: [place] Bruns, PA-C   ANESTHESIA:   regional and general  EBL:  150 mL   BLOOD ADMINISTERED:none  DRAINS: none   LOCAL MEDICATIONS USED:  MARCAINE     SPECIMEN:  No Specimen  DISPOSITION OF SPECIMEN:  N/A  COUNTS:  YES  TOURNIQUET:  * No tourniquets in log *  DICTATION: .Other Dictation: Dictation Number [id number]  PLAN OF CARE: Admit for overnight observation  PATIENT DISPOSITION:  PACU - hemodynamically stable.   Delay start of Pharmacological VTE agent (>24hrs) due to surgical blood loss or risk of bleeding: not applicable

## 2021-05-13 NOTE — Progress Notes (Incomplete)
Subjective: Day of Surgery Procedure(s) (LRB): REVERSE SHOULDER ARTHROPLASTY (Right)  Patient reports pain as mild.    Objective:   VITALS:  Temp:  [97.6 F (36.4 C)-98.5 F (36.9 C)] 98.5 F (36.9 C) (12/02 2050) Pulse Rate:  [69-85] 70 (12/02 2050) Resp:  [16-22] 16 (12/02 2050) BP: (95-184)/(54-82) 130/74 (12/02 2050) SpO2:  [92 %-100 %] 92 % (12/02 2050) Weight:  [77.2 kg] 77.2 kg (12/02 0551)  Neurovascular intact Sensation intact distally Intact pulses distally Motor intact Incision: c/d/i No cellulitis present Compartment soft   LABS No results for input(s): HGB, WBC, PLT in the last 72 hours. No results for input(s): NA, K, CL, CO2, BUN, CREATININE, GLUCOSE in the last 72 hours. No results for input(s): LABPT, INR in the last 72 hours.   Assessment/Plan: Day of Surgery Procedure(s) (LRB): REVERSE SHOULDER ARTHROPLASTY (Right)  Advance diet Up with therapy D/C IV fluids Discharge home with home health  [name] 05/13/2021, 10:22 PM

## 2021-05-13 NOTE — Anesthesia Procedure Notes (Signed)
Procedure Name: Intubation Date/Time: 05/13/2021 7:31 AM Performed by: Cleda Daub, CRNA Pre-anesthesia Checklist: Patient identified, Emergency Drugs available, Suction available and Patient being monitored Patient Re-evaluated:Patient Re-evaluated prior to induction Oxygen Delivery Method: Circle system utilized Preoxygenation: Pre-oxygenation with 100% oxygen Induction Type: IV induction Ventilation: Mask ventilation without difficulty Laryngoscope Size: Mac and 3 Grade View: Grade I Tube type: Oral Tube size: 7.5 mm Number of attempts: 1 Airway Equipment and Method: Stylet and Oral airway Placement Confirmation: ETT inserted through vocal cords under direct vision, positive ETCO2 and breath sounds checked- equal and bilateral Secured at: 22 cm Tube secured with: Tape Dental Injury: Teeth and Oropharynx as per pre-operative assessment

## 2021-05-13 NOTE — Anesthesia Postprocedure Evaluation (Signed)
Anesthesia Post Note  Patient: [name]  Procedure(s) Performed: REVERSE SHOULDER ARTHROPLASTY (Right: Shoulder)     Patient location during evaluation: PACU Anesthesia Type: General Level of consciousness: awake and alert, patient cooperative and oriented Pain management: pain level controlled Vital Signs Assessment: post-procedure vital signs reviewed and stable Respiratory status: spontaneous breathing, nonlabored ventilation and respiratory function stable Cardiovascular status: blood pressure returned to baseline and stable Postop Assessment: no apparent nausea or vomiting and adequate PO intake Anesthetic complications: no   No notable events documented.  Last Vitals:  Vitals:   05/13/21 0945 05/13/21 1000  BP: 104/60 (!) 95/54  Pulse: 76 74  Resp: 17 16  Temp:    SpO2: 93% 92%    Last Pain:  Vitals:   05/13/21 1000  TempSrc:   PainSc: 0-No pain                 Michelena Culmer,E. Dewayne Jurek

## 2021-05-13 NOTE — Anesthesia Procedure Notes (Signed)
Anesthesia Regional Block: Interscalene brachial plexus block   Pre-Anesthetic Checklist: , timeout performed,  Correct Patient, Correct Site, Correct Laterality,  Correct Procedure, Correct Position, site marked,  Risks and benefits discussed,  Surgical consent,  Pre-op evaluation,  At surgeon's request and post-op pain management  Laterality: Right and Upper  Prep: chloraprep       Needles:  Injection technique: Single-shot  Needle Type: Echogenic Needle     Needle Length: 9cm  Needle Gauge: 21     Additional Needles:   Procedures:,,,, ultrasound used (permanent image in chart),,    Narrative:  Start time: 05/13/2021 7:01 AM End time: 05/13/2021 7:07 AM Injection made incrementally with aspirations every 5 mL.  Performed by: Personally  Anesthesiologist: Annye Asa, MD  Additional Notes: Pt identified in Holding room.  Monitors applied. Working IV access confirmed. Sterile prep R clavicle and neck.  #21ga ECHOgenic Arrow block needle to interscalene brachial plexus with US guidance.  10cc 0.5% Bupivacaine with 1:200k epi, Exparel injected incrementally after negative test dose.  Patient asymptomatic, VSS, no heme aspirated, tolerated well.   Jenita Seashore, MD

## 2021-05-14 DIAGNOSIS — I130 Hypertensive heart and chronic kidney disease with heart failure and stage 1 through stage 4 chronic kidney disease, or unspecified chronic kidney disease: Secondary | ICD-10-CM | POA: Diagnosis not present

## 2021-05-14 DIAGNOSIS — M19011 Primary osteoarthritis, right shoulder: Secondary | ICD-10-CM | POA: Diagnosis not present

## 2021-05-14 DIAGNOSIS — N184 Chronic kidney disease, stage 4 (severe): Secondary | ICD-10-CM | POA: Diagnosis not present

## 2021-05-14 DIAGNOSIS — J449 Chronic obstructive pulmonary disease, unspecified: Secondary | ICD-10-CM | POA: Diagnosis not present

## 2021-05-14 DIAGNOSIS — M75101 Unspecified rotator cuff tear or rupture of right shoulder, not specified as traumatic: Secondary | ICD-10-CM | POA: Diagnosis not present

## 2021-05-14 DIAGNOSIS — J45909 Unspecified asthma, uncomplicated: Secondary | ICD-10-CM | POA: Diagnosis not present

## 2021-05-14 LAB — BASIC METABOLIC PANEL
Anion gap: 5 (ref 5–15)
BUN: 19 mg/dL (ref 8–23)
CO2: 25 mmol/L (ref 22–32)
Calcium: 8.4 mg/dL — ABNORMAL LOW (ref 8.9–10.3)
Chloride: 104 mmol/L (ref 98–111)
Creatinine, Ser: 0.97 mg/dL (ref 0.61–1.24)
GFR, Estimated: >60 mL/min (ref >60)
Glucose, Bld: 188 mg/dL — ABNORMAL HIGH (ref 70–99)
Potassium: 3.9 mmol/L (ref 3.5–5.1)
Sodium: 134 mmol/L — ABNORMAL LOW (ref 135–145)

## 2021-05-14 LAB — HEMOGLOBIN AND HEMATOCRIT, BLOOD
HCT: 34.9 % — ABNORMAL LOW (ref 39.0–52.0)
Hemoglobin: 12.5 g/dL — ABNORMAL LOW (ref 13.0–17.0)

## 2021-05-14 MED ORDER — TRAMADOL HCL 50 MG PO TABS: 50.0000 mg | ORAL_TABLET | Freq: Four times a day (QID) | ORAL | 0 refills | Status: DC | PRN

## 2021-05-14 NOTE — Discharge Summary (Signed)
In most cases prophylactic antibiotics for Dental procdeures after total joint surgery are not necessary.  Exceptions are as follows:  1. History of prior total joint infection  2. Severely immunocompromised (Organ Transplant, cancer chemotherapy, Rheumatoid biologic meds such as [place])  3. Poorly controlled diabetes (A1C &gt; 8.0, blood glucose over 200)  If you have one of these conditions, contact your surgeon for an antibiotic prescription, prior to your dental procedure. Orthopedic Discharge Summary        Physician Discharge Summary  Patient ID: [name] MRN: [id number] DOB/AGE: [birth year] 87 y.o.  Admit date: 05/13/2021 Discharge date: 05/14/2021   Procedures:  Procedure(s) (LRB): REVERSE SHOULDER ARTHROPLASTY (Right)  Attending Physician:  Dr. 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 Patient tolerated the procedure well and brought to the recovery room in good condition and subsequently to the floor. Patient had an uncomplicated hospital course and was stable for discharge.   Disposition: Discharge disposition: 01-Home or Self Care      with follow up in 2 weeks    Follow-up Information     [name], MD. Call in 2 week(s).   Specialty: Orthopedic Surgery Why: call [phone] for appt in two weeks Contact  information: [address] [place] [address] [phone]                 Dental Antibiotics:  In most cases prophylactic antibiotics for Dental procdeures after total joint surgery are not necessary.  Exceptions are as follows:  1. History of prior total joint infection  2. Severely immunocompromised (Organ Transplant, cancer chemotherapy, Rheumatoid biologic meds such as [place])  3. Poorly controlled diabetes (A1C &gt; 8.0, blood glucose over 200)  If you have one of these conditions, contact your surgeon for an antibiotic prescription, prior to your dental procedure.  Discharge Instructions     Call MD / Call 911   Complete by: As directed    If you experience chest pain or shortness of breath, CALL 911 and be transported to the hospital emergency room.  If you develope a fever above 101 F, pus (white drainage) or increased drainage or redness at the wound, or calf pain, call your surgeon's office.   Constipation Prevention   Complete by: As directed    Drink plenty of fluids.  Prune juice may be helpful.  You may use a stool softener, such as Colace (over the counter) 100 mg twice a day.  Use MiraLax (over the counter) for constipation as needed.   Diet - low sodium heart healthy   Complete by: As directed    Increase activity slowly as tolerated   Complete by: As directed    Post-operative opioid  taper instructions:   Complete by: As directed    POST-OPERATIVE OPIOID TAPER INSTRUCTIONS: It is important to wean off of your opioid medication as soon as possible. If you do not need pain medication after your surgery it is ok to stop day one. Opioids include: Codeine, Hydrocodone(Norco, Vicodin), Oxycodone(Percocet, oxycontin) and hydromorphone amongst others.  Long term and even short term use of opiods can cause: Increased pain response Dependence Constipation Depression Respiratory depression And more.  Withdrawal symptoms can  include Flu like symptoms Nausea, vomiting And more Techniques to manage these symptoms Hydrate well Eat regular healthy meals Stay active Use relaxation techniques(deep breathing, meditating, yoga) Do Not substitute Alcohol to help with tapering If you have been on opioids for less than two weeks and do not have pain than it is ok to stop all together.  Plan to wean off of opioids This plan should start within one week post op of your joint replacement. Maintain the same interval or time between taking each dose and first decrease the dose.  Cut the total daily intake of opioids by one tablet each day Next start to increase the time between doses. The last dose that should be eliminated is the evening dose.          Allergies as of 05/14/2021       Reactions   Codeine Nausea And Vomiting   SICK   Penicillins Other (See Comments)   unknown        Medication List     TAKE these medications    acetaminophen 500 MG tablet Commonly known as: TYLENOL Take 500-1,000 mg by mouth every 6 (six) hours as needed (for pain).   albuterol 108 (90 Base) MCG/ACT inhaler Commonly known as: VENTOLIN HFA Inhale 1-2 puffs into the lungs every 6 (six) hours as needed for wheezing or shortness of breath.   Allergy Eye 0.025-0.3 % ophthalmic solution Generic drug: naphazoline-pheniramine 1-2 drops 4 (four) times daily as needed for eye irritation.   amLODipine 5 MG tablet Commonly known as: NORVASC Take 5 mg by mouth at bedtime.   budesonide 0.5 MG/2ML nebulizer solution Commonly known as: PULMICORT Take 0.5 mg by nebulization 2 (two) times daily as needed (wheezing/shortness of breath).   carvedilol 6.25 MG tablet Commonly known as: COREG Take 6.25 mg by mouth in the morning and at bedtime.   cetirizine 10 MG tablet Commonly known as: ZYRTEC Take 10 mg by mouth daily.   fluticasone 50 MCG/ACT nasal spray Commonly known as: FLONASE Place 1 spray into both nostrils in the  morning and at bedtime.   pravastatin 40 MG tablet Commonly known as: PRAVACHOL Take 40 mg by mouth in the morning.   tamsulosin 0.4 MG Caps capsule Commonly known as: FLOMAX Take 0.4 mg by mouth daily as needed (urinary pain).   traMADol 50 MG tablet Commonly known as: Ultram Take 1 tablet (50 mg total) by mouth every 6 (six) hours as needed.          Signed: [name] 05/14/2021, 10:58 AM  [facility] is now [facility] [address], [place], [place], [place] [address] Phone: [place]

## 2021-05-14 NOTE — Discharge Summary (Signed)
In most cases prophylactic antibiotics for Dental procdeures after total joint surgery are not necessary.  Exceptions are as follows:  1. History of prior total joint infection  2. Severely immunocompromised (Organ Transplant, cancer chemotherapy, Rheumatoid biologic meds such as [place])  3. Poorly controlled diabetes (A1C &gt; 8.0, blood glucose over 200)  If you have one of these conditions, contact your surgeon for an antibiotic prescription, prior to your dental procedure. Orthopedic Discharge Summary        Physician Discharge Summary  Patient ID: [name] MRN: [id number] DOB/AGE: 03-Sep-1932 87 y.o.  Admit date: 05/13/2021 Discharge date: 05/14/2021   Procedures:  Procedure(s) (LRB): REVERSE SHOULDER ARTHROPLASTY (Right)  Attending Physician:  Dr. 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 Patient tolerated the procedure well and brought to the recovery room in good condition and subsequently to the floor. Patient had an uncomplicated hospital course and was stable for discharge.   Disposition: Discharge disposition: 01-Home or Self Care      with follow up in 2 weeks    Follow-up Information     [name], MD. Call in 2 week(s).   Specialty: Orthopedic Surgery Why: call [phone] for appt in  two weeks Contact information: [address] [place] [address] [phone]                 Dental Antibiotics:  In most cases prophylactic antibiotics for Dental procdeures after total joint surgery are not necessary.  Exceptions are as follows:  1. History of prior total joint infection  2. Severely immunocompromised (Organ Transplant, cancer chemotherapy, Rheumatoid biologic meds such as [place])  3. Poorly controlled diabetes (A1C &gt; 8.0, blood glucose over 200)  If you have one of these conditions, contact your surgeon for an antibiotic prescription, prior to your dental procedure.  Discharge Instructions     Call MD / Call 911   Complete by: As directed    If you experience chest pain or shortness of breath, CALL 911 and be transported to the hospital emergency room.  If you develope a fever above 101 F, pus (white drainage) or increased drainage or redness at the wound, or calf pain, call your surgeon's office.   Constipation Prevention   Complete by: As directed    Drink plenty of fluids.  Prune juice may be helpful.  You may use a stool softener, such as Colace (over the counter) 100 mg twice a day.  Use MiraLax (over the counter) for constipation as needed.   Diet - low sodium heart healthy   Complete by: As directed    Increase activity slowly as tolerated   Complete by: As directed  Post-operative opioid taper instructions:   Complete by: As directed    POST-OPERATIVE OPIOID TAPER INSTRUCTIONS: It is important to wean off of your opioid medication as soon as possible. If you do not need pain medication after your surgery it is ok to stop day one. Opioids include: Codeine, Hydrocodone(Norco, Vicodin), Oxycodone(Percocet, oxycontin) and hydromorphone amongst others.  Long term and even short term use of opiods can cause: Increased pain response Dependence Constipation Depression Respiratory depression And more.  Withdrawal  symptoms can include Flu like symptoms Nausea, vomiting And more Techniques to manage these symptoms Hydrate well Eat regular healthy meals Stay active Use relaxation techniques(deep breathing, meditating, yoga) Do Not substitute Alcohol to help with tapering If you have been on opioids for less than two weeks and do not have pain than it is ok to stop all together.  Plan to wean off of opioids This plan should start within one week post op of your joint replacement. Maintain the same interval or time between taking each dose and first decrease the dose.  Cut the total daily intake of opioids by one tablet each day Next start to increase the time between doses. The last dose that should be eliminated is the evening dose.          Allergies as of 05/14/2021       Reactions   Codeine Nausea And Vomiting   SICK   Penicillins Other (See Comments)   unknown        Medication List     TAKE these medications    acetaminophen 500 MG tablet Commonly known as: TYLENOL Take 500-1,000 mg by mouth every 6 (six) hours as needed (for pain).   albuterol 108 (90 Base) MCG/ACT inhaler Commonly known as: VENTOLIN HFA Inhale 1-2 puffs into the lungs every 6 (six) hours as needed for wheezing or shortness of breath.   Allergy Eye 0.025-0.3 % ophthalmic solution Generic drug: naphazoline-pheniramine 1-2 drops 4 (four) times daily as needed for eye irritation.   amLODipine 5 MG tablet Commonly known as: NORVASC Take 5 mg by mouth at bedtime.   budesonide 0.5 MG/2ML nebulizer solution Commonly known as: PULMICORT Take 0.5 mg by nebulization 2 (two) times daily as needed (wheezing/shortness of breath).   carvedilol 6.25 MG tablet Commonly known as: COREG Take 6.25 mg by mouth in the morning and at bedtime.   cetirizine 10 MG tablet Commonly known as: ZYRTEC Take 10 mg by mouth daily.   fluticasone 50 MCG/ACT nasal spray Commonly known as: FLONASE Place 1 spray into both  nostrils in the morning and at bedtime.   pravastatin 40 MG tablet Commonly known as: PRAVACHOL Take 40 mg by mouth in the morning.   tamsulosin 0.4 MG Caps capsule Commonly known as: FLOMAX Take 0.4 mg by mouth daily as needed (urinary pain).   traMADol 50 MG tablet Commonly known as: Ultram Take 1 tablet (50 mg total) by mouth every 6 (six) hours as needed.          Signed: [name] 05/14/2021, 7:26 AM  [facility] is now [facility] [address], [place], [place], [place] [address] Phone: [place]

## 2021-05-14 NOTE — Progress Notes (Signed)
Occupational Therapy Treatment Patient Details Name: [name] MRN: [id number] DOB: 24-Jul-1932 Today's Date: 05/14/2021   History of present illness 87 yo M s/p R TSA - Reverse.  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  L RCR   OT comments  Treatment focused on reiteration of shoulder precautions and compensatory strategies for dressing. Granddaughter in room when therapist entered. Patient will have assistance of wife and intermittent assistance of family members at discharge. Patient provided with firm and consistent education to not use RUE and not allow shoulder movement. Her verbalizes understanding but has a history of memory deficits. May need to follow up when family arrives if handouts not enough.   Recommendations for follow up therapy are one component of a multi-disciplinary discharge planning process, led by the attending physician.  Recommendations may be updated based on patient status, additional functional criteria and insurance authorization.    Follow Up Recommendations  Follow physician's recommendations for discharge plan and follow up therapies    Assistance Recommended at Discharge Frequent or constant Supervision/Assistance  Equipment Recommendations  None recommended by OT    Recommendations for Other Services      Precautions / Restrictions Precautions Precautions: Shoulder Type of Shoulder Precautions: No A/PROM to R shoulder. Shoulder Interventions: Shoulder sling/immobilizer;Off for dressing/bathing/exercises Precaution Booklet Issued: Yes (comment) Precaution Comments: Reviewed Required Braces or Orthoses: Sling Restrictions Weight Bearing Restrictions: Yes RUE Weight Bearing: Non weight bearing       Mobility Bed  Mobility Overal bed mobility: Needs Assistance Bed Mobility: Supine to Sit     Supine to sit: Supervision          Transfers Overall transfer level: Needs assistance   Transfers: Sit to/from Stand Sit to Stand: Min guard           General transfer comment: Min guard for standing wtih dressing and transfer to recliner though no overt loss of balance. Patient reporting feeling midly dizzy but reports he's been having dizzy symptoms for "a while" and the doctor thinks it is vertigo. Therapist recommended a cane for safety if needed.     Balance Overall balance assessment: Mild deficits observed, not formally tested                                         ADL either performed or assessed with clinical judgement   ADL Overall ADL's : Needs assistance/impaired                 Upper Body Dressing : Maximal assistance Upper Body Dressing Details (indicate cue type and reason): max assist to don button up shirt. Lower Body Dressing: Moderate assistance Lower Body Dressing Details (indicate cue type and reason): Patient able to don boxers with min assist to pull up clothing over right hip and verbal cues to limit RUE use. Patient max assist to don jeans with belt with therapist recommending patient wear pajama bottoms or jogging pants.                    Extremity/Trunk Assessment  Vision Patient Visual Report: No change from baseline     Perception     Praxis      Cognition Arousal/Alertness: Awake/alert Behavior During Therapy: WFL for tasks assessed/performed Overall Cognitive Status: Within Functional Limits for tasks assessed                                            Exercises     Shoulder Instructions       General Comments      Pertinent Vitals/ Pain       Pain Assessment: Faces Faces Pain Scale: Hurts a little bit Pain Location: R shoulder Pain Intervention(s): Limited activity within  patient's tolerance;Monitored during session  Home Living                                          Prior Functioning/Environment              Frequency  Min 2X/week        Progress Toward Goals  OT Goals(current goals can now be found in the care plan section)  Progress towards OT goals: Progressing toward goals  Acute Rehab OT Goals OT Goal Formulation: With patient Time For Goal Achievement: 05/27/21 Potential to Achieve Goals: Good  Plan Discharge plan remains appropriate    Co-evaluation                 AM-PAC OT "6 Clicks" Daily Activity     Outcome Measure   Help from another person eating meals?: None Help from another person taking care of personal grooming?: None Help from another person toileting, which includes using toliet, bedpan, or urinal?: A Little   Help from another person to put on and taking off regular upper body clothing?: A Lot Help from another person to put on and taking off regular lower body clothing?: A Lot 6 Click Score: 15    End of Session    OT Visit Diagnosis: Unsteadiness on feet (R26.81);Pain Pain - Right/Left: Right Pain - part of body: Shoulder   Activity Tolerance Patient tolerated treatment well   Patient Left in chair;with call bell/phone within reach;with chair alarm set   Nurse Communication Mobility status (skin tear, page if family needs education.)        Time: 0800-0827 OT Time Calculation (min): 27 min  Charges: OT General Charges $OT Visit: 1 Visit OT Treatments $Self Care/Home Management : 23-37 mins  Shaylene Paganelli, OTR/L [place]  Office [phone] Pager: [place] 05/14/2021, 8:39 AM

## 2021-05-14 NOTE — Progress Notes (Signed)
The patient is alert and oriented and has been seen by his physician. The orders for discharge were written. Patient requested for Tramadol to be sent to [facility] in [place]. Called Dr. Veverly Fells, MD to make aware of patient's request. Made Dr. Veverly Fells, MD aware about skin tear on patient's left arm. Patient in a hurry to leave when his ride arrived. Patient refused for skin tear to be cleaned. Skin tear/cleaning supplies sent home per Dr. Veverly Fells, MD verbal order. Mepilex dressing applied to patient's skin tear. IV has been removed. Went over discharge instructions with patient. He is being discharged via wheelchair with all of his belongings.

## 2021-05-14 NOTE — Progress Notes (Signed)
Orthopedics Progress Note  Subjective: Comfortable this AM  Objective:  Vitals:   05/14/21 0045 05/14/21 0512  BP: 137/73 132/69  Pulse: 84 74  Resp: 17 18  Temp: 98.4 F (36.9 C) 98 F (36.7 C)  SpO2: 93% 91%    General: Awake and alert  Musculoskeletal: Right shoulder dressing CDI, block still in place Neurovascularly intact  Lab Results  Component Value Date   WBC 6.8 04/29/2021   HGB 12.5 (L) 05/14/2021   HCT 34.9 (L) 05/14/2021   MCV 93.8 04/29/2021   PLT 152 04/29/2021       Component Value Date/Time   NA 134 (L) 05/14/2021 0402   K 3.9 05/14/2021 0402   CL 104 05/14/2021 0402   CO2 25 05/14/2021 0402   GLUCOSE 188 (H) 05/14/2021 0402   BUN 19 05/14/2021 0402   CREATININE 0.97 05/14/2021 0402   CALCIUM 8.4 (L) 05/14/2021 0402   GFRNONAA >60 05/14/2021 0402    No results found for: INR, PROTIME  Assessment/Plan: POD #1 s/p Procedure(s): REVERSE SHOULDER ARTHROPLASTY Stable this AM. Home today after therapy. Follow up in two weeks in the office  Remo Lipps R. Veverly Fells, MD 05/14/2021 7:23 AM

## 2021-05-14 NOTE — Plan of Care (Signed)

## 2021-05-17 ENCOUNTER — Encounter (HOSPITAL_COMMUNITY): Payer: Self-pay | Admitting: Orthopedic Surgery

## 2021-05-25 DIAGNOSIS — Z4789 Encounter for other orthopedic aftercare: Secondary | ICD-10-CM | POA: Diagnosis not present

## 2021-06-20 DIAGNOSIS — G2581 Restless legs syndrome: Secondary | ICD-10-CM | POA: Diagnosis not present

## 2021-06-20 DIAGNOSIS — G4733 Obstructive sleep apnea (adult) (pediatric): Secondary | ICD-10-CM | POA: Diagnosis not present

## 2021-06-20 DIAGNOSIS — R051 Acute cough: Secondary | ICD-10-CM | POA: Diagnosis not present

## 2021-06-20 DIAGNOSIS — J301 Allergic rhinitis due to pollen: Secondary | ICD-10-CM | POA: Diagnosis not present

## 2021-06-20 DIAGNOSIS — J45901 Unspecified asthma with (acute) exacerbation: Secondary | ICD-10-CM | POA: Diagnosis not present

## 2021-06-21 DIAGNOSIS — J441 Chronic obstructive pulmonary disease with (acute) exacerbation: Secondary | ICD-10-CM | POA: Diagnosis not present

## 2021-06-21 DIAGNOSIS — R059 Cough, unspecified: Secondary | ICD-10-CM | POA: Diagnosis not present

## 2021-06-23 DIAGNOSIS — Z4789 Encounter for other orthopedic aftercare: Secondary | ICD-10-CM | POA: Diagnosis not present

## 2021-06-30 DIAGNOSIS — G4733 Obstructive sleep apnea (adult) (pediatric): Secondary | ICD-10-CM | POA: Diagnosis not present

## 2021-07-04 DIAGNOSIS — K219 Gastro-esophageal reflux disease without esophagitis: Secondary | ICD-10-CM | POA: Diagnosis not present

## 2021-07-04 DIAGNOSIS — D692 Other nonthrombocytopenic purpura: Secondary | ICD-10-CM | POA: Diagnosis not present

## 2021-07-04 DIAGNOSIS — J439 Emphysema, unspecified: Secondary | ICD-10-CM | POA: Diagnosis not present

## 2021-07-04 DIAGNOSIS — R42 Dizziness and giddiness: Secondary | ICD-10-CM | POA: Diagnosis not present

## 2021-07-04 DIAGNOSIS — N1831 Chronic kidney disease, stage 3a: Secondary | ICD-10-CM | POA: Diagnosis not present

## 2021-07-04 DIAGNOSIS — G4730 Sleep apnea, unspecified: Secondary | ICD-10-CM | POA: Diagnosis not present

## 2021-07-04 DIAGNOSIS — J45909 Unspecified asthma, uncomplicated: Secondary | ICD-10-CM | POA: Diagnosis not present

## 2021-07-04 DIAGNOSIS — H9193 Unspecified hearing loss, bilateral: Secondary | ICD-10-CM | POA: Diagnosis not present

## 2021-07-04 DIAGNOSIS — J449 Chronic obstructive pulmonary disease, unspecified: Secondary | ICD-10-CM | POA: Diagnosis not present

## 2021-07-04 DIAGNOSIS — N400 Enlarged prostate without lower urinary tract symptoms: Secondary | ICD-10-CM | POA: Diagnosis not present

## 2021-07-04 DIAGNOSIS — I130 Hypertensive heart and chronic kidney disease with heart failure and stage 1 through stage 4 chronic kidney disease, or unspecified chronic kidney disease: Secondary | ICD-10-CM | POA: Diagnosis not present

## 2021-07-04 DIAGNOSIS — E7800 Pure hypercholesterolemia, unspecified: Secondary | ICD-10-CM | POA: Diagnosis not present

## 2021-07-06 DIAGNOSIS — J4530 Mild persistent asthma, uncomplicated: Secondary | ICD-10-CM | POA: Diagnosis not present

## 2021-07-06 DIAGNOSIS — J301 Allergic rhinitis due to pollen: Secondary | ICD-10-CM | POA: Diagnosis not present

## 2021-07-06 DIAGNOSIS — G2581 Restless legs syndrome: Secondary | ICD-10-CM | POA: Diagnosis not present

## 2021-07-06 DIAGNOSIS — G4733 Obstructive sleep apnea (adult) (pediatric): Secondary | ICD-10-CM | POA: Diagnosis not present

## 2021-07-26 DIAGNOSIS — M25511 Pain in right shoulder: Secondary | ICD-10-CM | POA: Diagnosis not present

## 2021-07-28 DIAGNOSIS — M25511 Pain in right shoulder: Secondary | ICD-10-CM | POA: Diagnosis not present

## 2021-08-02 DIAGNOSIS — M25511 Pain in right shoulder: Secondary | ICD-10-CM | POA: Diagnosis not present

## 2021-08-04 DIAGNOSIS — M25511 Pain in right shoulder: Secondary | ICD-10-CM | POA: Diagnosis not present

## 2021-08-08 DIAGNOSIS — G4733 Obstructive sleep apnea (adult) (pediatric): Secondary | ICD-10-CM | POA: Diagnosis not present

## 2021-08-08 DIAGNOSIS — G2581 Restless legs syndrome: Secondary | ICD-10-CM | POA: Diagnosis not present

## 2021-08-08 DIAGNOSIS — J301 Allergic rhinitis due to pollen: Secondary | ICD-10-CM | POA: Diagnosis not present

## 2021-08-08 DIAGNOSIS — J4530 Mild persistent asthma, uncomplicated: Secondary | ICD-10-CM | POA: Diagnosis not present

## 2021-08-09 DIAGNOSIS — M25511 Pain in right shoulder: Secondary | ICD-10-CM | POA: Diagnosis not present

## 2021-08-11 DIAGNOSIS — M25511 Pain in right shoulder: Secondary | ICD-10-CM | POA: Diagnosis not present

## 2021-08-16 DIAGNOSIS — M25511 Pain in right shoulder: Secondary | ICD-10-CM | POA: Diagnosis not present

## 2021-08-18 DIAGNOSIS — M25511 Pain in right shoulder: Secondary | ICD-10-CM | POA: Diagnosis not present

## 2021-08-23 DIAGNOSIS — M25511 Pain in right shoulder: Secondary | ICD-10-CM | POA: Diagnosis not present

## 2021-08-30 DIAGNOSIS — M25511 Pain in right shoulder: Secondary | ICD-10-CM | POA: Diagnosis not present

## 2021-09-06 DIAGNOSIS — M25511 Pain in right shoulder: Secondary | ICD-10-CM | POA: Diagnosis not present

## 2021-09-15 DIAGNOSIS — M25511 Pain in right shoulder: Secondary | ICD-10-CM | POA: Diagnosis not present

## 2021-09-22 DIAGNOSIS — M25511 Pain in right shoulder: Secondary | ICD-10-CM | POA: Diagnosis not present

## 2021-09-29 DIAGNOSIS — M25511 Pain in right shoulder: Secondary | ICD-10-CM | POA: Diagnosis not present

## 2021-10-03 DIAGNOSIS — G4733 Obstructive sleep apnea (adult) (pediatric): Secondary | ICD-10-CM | POA: Diagnosis not present

## 2021-10-03 DIAGNOSIS — G2581 Restless legs syndrome: Secondary | ICD-10-CM | POA: Diagnosis not present

## 2021-10-03 DIAGNOSIS — J301 Allergic rhinitis due to pollen: Secondary | ICD-10-CM | POA: Diagnosis not present

## 2021-10-03 DIAGNOSIS — J4530 Mild persistent asthma, uncomplicated: Secondary | ICD-10-CM | POA: Diagnosis not present

## 2021-10-04 DIAGNOSIS — M25511 Pain in right shoulder: Secondary | ICD-10-CM | POA: Diagnosis not present

## 2021-10-06 DIAGNOSIS — G4733 Obstructive sleep apnea (adult) (pediatric): Secondary | ICD-10-CM | POA: Diagnosis not present

## 2021-10-07 DIAGNOSIS — Z20822 Contact with and (suspected) exposure to covid-19: Secondary | ICD-10-CM | POA: Diagnosis not present

## 2021-10-11 DIAGNOSIS — Z20822 Contact with and (suspected) exposure to covid-19: Secondary | ICD-10-CM | POA: Diagnosis not present

## 2021-10-11 DIAGNOSIS — M25511 Pain in right shoulder: Secondary | ICD-10-CM | POA: Diagnosis not present

## 2021-10-18 DIAGNOSIS — M25511 Pain in right shoulder: Secondary | ICD-10-CM | POA: Diagnosis not present

## 2021-10-20 DIAGNOSIS — Z961 Presence of intraocular lens: Secondary | ICD-10-CM | POA: Diagnosis not present

## 2021-11-10 DIAGNOSIS — H26492 Other secondary cataract, left eye: Secondary | ICD-10-CM | POA: Diagnosis not present

## 2021-11-16 DIAGNOSIS — L821 Other seborrheic keratosis: Secondary | ICD-10-CM | POA: Diagnosis not present

## 2021-11-16 DIAGNOSIS — L570 Actinic keratosis: Secondary | ICD-10-CM | POA: Diagnosis not present

## 2021-11-24 DIAGNOSIS — E7800 Pure hypercholesterolemia, unspecified: Secondary | ICD-10-CM | POA: Diagnosis not present

## 2021-11-24 DIAGNOSIS — R42 Dizziness and giddiness: Secondary | ICD-10-CM | POA: Diagnosis not present

## 2021-12-19 ENCOUNTER — Ambulatory Visit (INDEPENDENT_AMBULATORY_CARE_PROVIDER_SITE_OTHER): Payer: Medicare Other | Admitting: Cardiology

## 2021-12-19 VITALS — BP 180/100 | HR 58 | Ht 69.0 in | Wt 170.0 lb

## 2021-12-19 DIAGNOSIS — I10 Essential (primary) hypertension: Secondary | ICD-10-CM

## 2021-12-19 DIAGNOSIS — I951 Orthostatic hypotension: Secondary | ICD-10-CM | POA: Diagnosis not present

## 2021-12-19 MED ORDER — AMLODIPINE BESYLATE 2.5 MG PO TABS: 2.5000 mg | ORAL_TABLET | Freq: Every day | ORAL | 3 refills | Status: DC

## 2021-12-19 NOTE — Patient Instructions (Signed)
Medication Instructions:  Your physician has recommended you make the following change in your medication: 1.Amlodipine (Norvasc) has been decreased to 2.5 mg daily.  2. Stop taking Flomax (tamsulosin)  *If you need a refill on your cardiac medications before your next appointment, please call your pharmacy*  Follow-Up: At [facility], you and your health needs are our priority.  As part of our continuing mission to provide you with exceptional heart care, we have created designated Provider Care Teams.  These Care Teams include your primary Cardiologist (physician) and Advanced Practice Providers (APPs -  Physician Assistants and Nurse Practitioners) who all work together to provide you with the care you need, when you need it.  We recommend signing up for the patient portal called "MyChart".  Sign up information is provided on this After Visit Summary.  MyChart is used to connect with patients for Virtual Visits (Telemedicine).  Patients are able to view lab/test results, encounter notes, upcoming appointments, etc.  Non-urgent messages can be sent to your provider as well.   To learn more about what you can do with MyChart, go to [email or web address].    Your next appointment:   6 week(s)  The format for your next appointment:   In Person  Provider:   With APP

## 2021-12-19 NOTE — Progress Notes (Signed)
Cardiology Office Note:    Date:  12/19/2021   ID:  [name], DOB 1933-05-02, MRN [id number]  PCP:  Antony Contras, MD   [facility] Providers Cardiologist:  Candee Furbish, MD     Referring MD: Antony Contras, MD    History of Present Illness:    [name] is a 88 y.o. male here for the evaluation of orthostatic hypotension/vertigo at the request of Dr. Antony Contras.  Apparently according to prior outside office note HPI he was having dizziness when he laid down/got up.  Walks down the hallway has to hold onto walls sometimes.  Clearly has 2 separate processes, 1 is a vertiginous-like symptom and one is a decrease in blood pressure when standing  They did orthostatics and he was 180/89 laying, 168/85 sitting, 121/67 standing.  Here his orthostatics were not quite as dramatic at 170 laying, 180 sitting, 170 standing and then 160 standing for 3 minutes.  This is while he has been off of the amlodipine as well as Flomax and decreased carvedilol.  Has a history of polymyalgia rheumatica, COPD.  His carvedilol was decreased to 3.125 mg twice a day recently.  Tamsulosin was stopped.  He was referred to ENT for recurrent vertigo.  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)    Hypocalcemia    Memory difficulty 05/15/2017   Memory loss    Orthostatic dizziness    Polymyalgia rheumatica (HCC)    Prostatic hyperplasia    Restless leg syndrome    Skin cancer, basal cell    Sleep apnea    Transient global amnesia     Past Surgical History:  Procedure Laterality Date   [place] ARTHROPLASTY Right 05/13/2021    Procedure: REVERSE SHOULDER ARTHROPLASTY;  Surgeon: [name], MD;  Location: [facility];  Service: Orthopedics;  Laterality: Right;  with ISB   SHOULDER SURGERY Left     Current Medications: Current Meds  Medication Sig   acetaminophen (TYLENOL) 500 MG tablet Take 500-1,000 mg by mouth every 6 (six) hours as needed (for pain).   albuterol (VENTOLIN HFA) 108 (90 Base) MCG/ACT inhaler Inhale 1-2 puffs into the lungs every 6 (six) hours as needed for wheezing or shortness of breath.   ALPRAZolam (XANAX) 0.25 MG tablet Take 0.25 mg by mouth at bedtime.   amLODipine (NORVASC) 2.5 MG tablet Take 1 tablet (2.5 mg total) by mouth daily.   budesonide (PULMICORT) 0.5 MG/2ML nebulizer solution Take 0.5 mg by nebulization 2 (two) times daily as needed (wheezing/shortness of breath).   carvedilol (COREG) 3.125 MG tablet Take 3.125 mg by mouth 2 (two) times daily.   fluticasone (FLONASE) 50 MCG/ACT nasal spray Place 1 spray into both nostrils in the morning and at bedtime.   naphazoline-pheniramine (ALLERGY EYE) 0.025-0.3 % ophthalmic solution 1-2 drops 4 (four) times daily as needed for eye irritation.   pravastatin (PRAVACHOL) 40 MG tablet Take 40 mg by mouth in the morning.   rOPINIRole (REQUIP) 0.5 MG tablet Take 0.5 mg by mouth at bedtime.   [  DISCONTINUED] amLODipine (NORVASC) 5 MG tablet Take 5 mg by mouth at bedtime.   [DISCONTINUED] tamsulosin (FLOMAX) 0.4 MG CAPS capsule Take 0.4 mg by mouth daily as needed (urinary pain).     Allergies:   Codeine and Penicillins   Social History   Socioeconomic History   Marital status: Married    Spouse name: Not on file   Number of children: 2   Years of education: 12   Highest education level: Not on file  Occupational History   Not on file  Tobacco Use   Smoking status: Former    Types: Cigarettes    Quit date: 07/10/1978    Years since quitting: 43.4   Smokeless tobacco: Never  Vaping Use   Vaping Use: Never used  Substance and Sexual  Activity   Alcohol use: No   Drug use: No   Sexual activity: Never  Other Topics Concern   Not on file  Social History Narrative      Caffeine use: coffee/coke daily   Right handed    Social Determinants of Health   Financial Resource Strain: Not on file  Food Insecurity: Not on file  Transportation Needs: Not on file  Physical Activity: Not on file  Stress: Not on file  Social Connections: Not on file     Family History: The patient's family history includes CVA in his mother; Cancer - Lung in his brother and brother; Cancer - Prostate in his father; Hypertension in his mother.  ROS:   Please see the history of present illness.     All other systems reviewed and are negative.  EKGs/Labs/Other Studies Reviewed:    The following studies were reviewed today: Echo 03/29/2021-normal ejection fraction, no valvular issues  EKG:  EKG is  ordered today.  The ekg ordered today demonstrates sinus bradycardia 58 with right bundle branch block left anterior fascicular block, bifascicular block  Recent Labs: 04/29/2021: Platelets 152 05/14/2021: BUN 19; Creatinine, Ser 0.97; Hemoglobin 12.5; Potassium 3.9; Sodium 134  Recent Lipid Panel No results found for: "CHOL", "TRIG", "HDL", "CHOLHDL", "VLDL", "LDLCALC", "LDLDIRECT"   Risk Assessment/Calculations:              Physical Exam:    VS:  BP (!) 180/100 (BP Location: Left Arm, Patient Position: Sitting, Cuff Size: Normal)   Pulse (!) 58   Ht '5\' 9"'$  (1.753 m)   Wt 170 lb (77.1 kg)   SpO2 97%   BMI 25.10 kg/m     Wt Readings from Last 3 Encounters:  12/19/21 170 lb (77.1 kg)  05/13/21 170 lb 3.2 oz (77.2 kg)  04/29/21 170 lb 3.2 oz (77.2 kg)     GEN:  Well nourished, well developed in no acute distress HEENT: Normal NECK: No JVD; No carotid bruits LYMPHATICS: No lymphadenopathy CARDIAC: RRR, no murmurs, no rubs, gallops RESPIRATORY:  Clear to auscultation without rales, wheezing or rhonchi  ABDOMEN: Soft,  non-tender, non-distended MUSCULOSKELETAL:  No edema; No deformity  SKIN: Warm and dry NEUROLOGIC:  Alert and oriented x 3 PSYCHIATRIC:  Normal affect   ASSESSMENT:    1. Essential hypertension   2. Orthostatic hypotension    PLAN:    In order of problems listed above:  Orthostatic hypotension Significant orthostatic hypotension was noted from data above, blood pressure decreased from 180 down to 121.  I agree with decreasing the carvedilol to 3.125 mg twice a day.  His tamsulosin was stopped.  Agree.  We will give him back his amlodipine at  2.5 mg once a day.  He has been off of this medication for the past couple days.  We may have to be willing to tolerate higher blood pressures.         Medication Adjustments/Labs and Tests Ordered: Current medicines are reviewed at length with the patient today.  Concerns regarding medicines are outlined above.  Orders Placed This Encounter  Procedures   EKG 12-Lead   Meds ordered this encounter  Medications   amLODipine (NORVASC) 2.5 MG tablet    Sig: Take 1 tablet (2.5 mg total) by mouth daily.    Dispense:  90 tablet    Refill:  3    Patient Instructions  Medication Instructions:  Your physician has recommended you make the following change in your medication: 1.Amlodipine (Norvasc) has been decreased to 2.5 mg daily.  2. Stop taking Flomax (tamsulosin)  *If you need a refill on your cardiac medications before your next appointment, please call your pharmacy*  Follow-Up: At [facility], you and your health needs are our priority.  As part of our continuing mission to provide you with exceptional heart care, we have created designated Provider Care Teams.  These Care Teams include your primary Cardiologist (physician) and Advanced Practice Providers (APPs -  Physician Assistants and Nurse Practitioners) who all work together to provide you with the care you need, when you need it.  We recommend signing up for the  patient portal called "MyChart".  Sign up information is provided on this After Visit Summary.  MyChart is used to connect with patients for Virtual Visits (Telemedicine).  Patients are able to view lab/test results, encounter notes, upcoming appointments, etc.  Non-urgent messages can be sent to your provider as well.   To learn more about what you can do with MyChart, go to [email or web address].    Your next appointment:   6 week(s)  The format for your next appointment:   In Person  Provider:   With APP            Signed, Candee Furbish, MD  12/19/2021 4:12 PM    [place]

## 2021-12-19 NOTE — Assessment & Plan Note (Addendum)
Significant orthostatic hypotension was noted from data above, blood pressure decreased from 180 down to 121.  I agree with decreasing the carvedilol to 3.125 mg twice a day.  His tamsulosin was stopped.  Agree.  We will give him back his amlodipine at 2.5 mg once a day.  He has been off of this medication for the past couple days.  We may have to be willing to tolerate higher blood pressures.

## 2022-01-25 NOTE — Progress Notes (Addendum)
Office Visit    Patient Name: [name] Date of Encounter: 02/01/2022  Primary Care Provider:  Antony Contras, MD Primary Cardiologist:  Candee Furbish, MD Primary Electrophysiologist: None  Chief Complaint    [name] is a 88 y.o. male with PMH of HTN, HLD, GERD, hypotension, COPD, CHF, sleep apnea on BiPAP who presents today for follow-up of hypotension.  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)    Hypocalcemia    Memory difficulty 05/15/2017   Memory loss    Orthostatic dizziness    Polymyalgia rheumatica (HCC)    Prostatic hyperplasia    Restless leg syndrome    Skin cancer, basal cell    Sleep apnea    Transient global amnesia    Past Surgical History:  Procedure Laterality Date   [place] ARTHROPLASTY Right 05/13/2021   Procedure: REVERSE SHOULDER ARTHROPLASTY;  Surgeon: [name], MD;  Location: [facility];  Service: Orthopedics;  Laterality: Right;  with ISB   SHOULDER SURGERY Left     Allergies  Allergies  Allergen Reactions   Codeine Nausea And Vomiting    SICK   Penicillins Other (See Comments)    unknown    History of Present Illness    [name] is a 88 year old male with the above-mentioned past medical history presents today for 6-week follow-up of hypertension.  [name] was originally seen by Dr. Marlou Porch in 2018 for evaluation of possible heart failure.  2D echo was ordered with EF of 65-70%, moderate LVH with grade 1 DD and mild aortic valve regurg.cardiac catheterization report from 2016 which showed normal coronary arteries, normal left ventricular ejection fraction, normal left ventricular  end-diastolic pressure of 9 mmHg.  He was lost to follow-up until 12/2021 when he presented with orthostatic hypotension with vertigo.  In office orthostatics were positive but not dramatic.  Patient had been off of amlodipine, Flomax.  It was noted that Lance carvedilol was decreased to 3.125 mg twice daily.  He was recently referred to ENT for vertigo work-up.  He was restarted on amlodipine 2.5 mg due to blood pressure elevation.  [name] presents today for follow-up with [name].  He reports that he is feeling a little better but still experiencing some dizziness when sitting from lying position.  He has a appointment scheduled with ENT in September.  Orthostatics were performed and were much improved during visit.  Lance blood pressure initially was 190/94 but decreased to 134/80 on recheck.  Patient denies chest pain, palpitations, dyspnea, PND, orthopnea, nausea, vomiting, dizziness, syncope, edema, weight gain, or early satiety.  Home Medications    Current Outpatient Medications  Medication Sig Dispense Refill   acetaminophen (TYLENOL) 500 MG tablet Take 500-1,000 mg by mouth every 6 (six) hours as needed (for pain).     albuterol (VENTOLIN HFA) 108 (90 Base) MCG/ACT inhaler Inhale 1-2 puffs into the lungs every 6 (six) hours as needed for wheezing or shortness of breath.     ALPRAZolam (XANAX) 0.25 MG tablet Take 0.25 mg by mouth  at bedtime.     amLODipine (NORVASC) 2.5 MG tablet Take 1 tablet (2.5 mg total) by mouth daily. 90 tablet 3   budesonide (PULMICORT) 0.5 MG/2ML nebulizer solution Take 0.5 mg by nebulization 2 (two) times daily as needed (wheezing/shortness of breath).     carvedilol (COREG) 3.125 MG tablet Take 3.125 mg by mouth 2 (two) times daily.     fluticasone (FLONASE) 50 MCG/ACT nasal spray Place 1 spray into both nostrils in the morning and at bedtime.     naphazoline-pheniramine (ALLERGY EYE) 0.025-0.3 % ophthalmic solution 1-2 drops 4 (four) times daily as needed for eye  irritation.     pravastatin (PRAVACHOL) 40 MG tablet Take 40 mg by mouth in the morning.     rOPINIRole (REQUIP) 0.5 MG tablet Take 0.5 mg by mouth at bedtime.     No current facility-administered medications for this visit.     Review of Systems  Please see the history of present illness.    (+) Dizziness   All other systems reviewed and are otherwise negative except as noted above.  Physical Exam    Wt Readings from Last 3 Encounters:  02/01/22 167 lb (75.8 kg)  12/19/21 170 lb (77.1 kg)  05/13/21 170 lb 3.2 oz (77.2 kg)   Orthostatic VS for the past 72 hrs (Last 3 readings):  Orthostatic BP Patient Position BP Location Cuff Size Orthostatic Pulse  02/01/22 1605 142/68 Standing Left Arm Normal 68  02/01/22 1604 160/88 Sitting Left Arm Normal 65  02/01/22 1603 166/80 Supine Left Arm -- 52     Constitutional:      Appearance: Healthy appearance. Not in distress.  Neck:     Vascular: JVD normal.  Pulmonary:     Effort: Pulmonary effort is normal.     Breath sounds: No wheezing. No rales. Diminished in the bases Cardiovascular:     Normal rate. Regular rhythm. Normal S1. Normal S2.      Murmurs: There is no murmur.  Edema:    Peripheral edema absent.  Abdominal:     Palpations: Abdomen is soft non tender. There is no hepatomegaly.  Skin:    General: Skin is warm and dry.  Neurological:     General: No focal deficit present.     Mental Status: Alert and oriented to person, place and time.     Cranial Nerves: Cranial nerves are intact.  EKG/LABS/Other Studies Reviewed    ECG personally reviewed by me today -none completed today      Lab Results  Component Value Date   WBC 6.8 04/29/2021   HGB 12.5 (L) 05/14/2021   HCT 34.9 (L) 05/14/2021   MCV 93.8 04/29/2021   PLT 152 04/29/2021   Lab Results  Component Value Date   CREATININE 0.97 05/14/2021   BUN 19 05/14/2021   NA 134 (L) 05/14/2021   K 3.9 05/14/2021   CL 104 05/14/2021   CO2 25 05/14/2021   No  results found for: "ALT", "AST", "GGT", "ALKPHOS", "BILITOT" No results found for: "CHOL", "HDL", "LDLCALC", "LDLDIRECT", "TRIG", "CHOLHDL"  No results found for: "HGBA1C"  Assessment & Plan    1.  Orthostatic hypotension: -Patient blood pressure is much improved with with minimal orthostatic readings.  Patient was also slightly dizzy from lying to sitting position but resolved from sitting to standing. -We will not make any medication changes currently due to improvement and readings. -Patient has follow-up with ENT to discuss further possibility of vertigo.  2.  Essential hypertension: -  Patient's blood pressure today was 190/94 and was 134/80 on recheck -Currently on carvedilol 3.125 twice daily and amlodipine 2.5 mg daily  3.  Dyspnea on exertion -Patient has history of emphysema and chronic bronchitis -He is currently being followed by pulmonology and had no dyspnea during visit  4.  Hyperlipidemia: -Patient's most recent LDL cholesterol was 98  -Continue pravastatin 40 mg daily  Disposition: Follow-up with Candee Furbish, MD or APP in 3 months   Medication Adjustments/Labs and Tests Ordered: Current medicines are reviewed at length with the patient today.  Concerns regarding medicines are outlined above.   Signed, Mable Fill, Marissa Nestle, NP 02/01/2022, 4:07 PM [place]

## 2022-02-01 ENCOUNTER — Ambulatory Visit (INDEPENDENT_AMBULATORY_CARE_PROVIDER_SITE_OTHER): Payer: Medicare Other | Admitting: Nurse Practitioner

## 2022-02-01 ENCOUNTER — Encounter: Payer: Self-pay | Admitting: Nurse Practitioner

## 2022-02-01 VITALS — BP 190/94 | HR 67 | Ht 69.0 in | Wt 167.0 lb

## 2022-02-01 DIAGNOSIS — I10 Essential (primary) hypertension: Secondary | ICD-10-CM

## 2022-02-01 DIAGNOSIS — I951 Orthostatic hypotension: Secondary | ICD-10-CM

## 2022-02-01 DIAGNOSIS — J42 Unspecified chronic bronchitis: Secondary | ICD-10-CM | POA: Diagnosis not present

## 2022-02-01 DIAGNOSIS — R0609 Other forms of dyspnea: Secondary | ICD-10-CM | POA: Diagnosis not present

## 2022-02-01 NOTE — Patient Instructions (Signed)
Medication Instructions:  Your physician recommends that you continue on your current medications as directed. Please refer to the Current Medication list given to you today.  *If you need a refill on your cardiac medications before your next appointment, please call your pharmacy*   Lab Work: NONE If you have labs (blood work) drawn today and your tests are completely normal, you will receive your results only by: [place] (if you have MyChart) OR A paper copy in the mail If you have any lab test that is abnormal or we need to change your treatment, we will call you to review the results.   Testing/Procedures: NONE   Follow-Up: At [facility], you and your health needs are our priority.  As part of our continuing mission to provide you with exceptional heart care, we have created designated Provider Care Teams.  These Care Teams include your primary Cardiologist (physician) and Advanced Practice Providers (APPs -  Physician Assistants and Nurse Practitioners) who all work together to provide you with the care you need, when you need it.  We recommend signing up for the patient portal called "MyChart".  Sign up information is provided on this After Visit Summary.  MyChart is used to connect with patients for Virtual Visits (Telemedicine).  Patients are able to view lab/test results, encounter notes, upcoming appointments, etc.  Non-urgent messages can be sent to your provider as well.   To learn more about what you can do with MyChart, go to [email or web address].    Your next appointment:   3 month(s)  The format for your next appointment:   In Person  Provider:   Ambrose Pancoast, NP  Important Information About Sugar

## 2022-02-07 DIAGNOSIS — H8110 Benign paroxysmal vertigo, unspecified ear: Secondary | ICD-10-CM | POA: Diagnosis not present

## 2022-02-07 DIAGNOSIS — I951 Orthostatic hypotension: Secondary | ICD-10-CM | POA: Diagnosis not present

## 2022-02-28 DIAGNOSIS — Z7952 Long term (current) use of systemic steroids: Secondary | ICD-10-CM | POA: Diagnosis not present

## 2022-02-28 DIAGNOSIS — M1990 Unspecified osteoarthritis, unspecified site: Secondary | ICD-10-CM | POA: Diagnosis not present

## 2022-02-28 DIAGNOSIS — M6530 Trigger finger, unspecified finger: Secondary | ICD-10-CM | POA: Diagnosis not present

## 2022-02-28 DIAGNOSIS — M65332 Trigger finger, left middle finger: Secondary | ICD-10-CM | POA: Diagnosis not present

## 2022-02-28 DIAGNOSIS — M810 Age-related osteoporosis without current pathological fracture: Secondary | ICD-10-CM | POA: Diagnosis not present

## 2022-02-28 DIAGNOSIS — M79643 Pain in unspecified hand: Secondary | ICD-10-CM | POA: Diagnosis not present

## 2022-02-28 DIAGNOSIS — M353 Polymyalgia rheumatica: Secondary | ICD-10-CM | POA: Diagnosis not present

## 2022-03-16 ENCOUNTER — Telehealth: Payer: Self-pay | Admitting: Cardiology

## 2022-03-16 NOTE — Telephone Encounter (Signed)
STAT if HR is under 50 or over 120 (normal HR is 60-100 beats per minute)  What is your heart rate? HR 58  Do you have a log of your heart rate readings (document readings)?   No  Do you have any other symptoms?      Son stated patient's PT provider took his HR  this morning and noticed his HR was 35. Son stated patient has been having irregular heart beats.

## 2022-03-16 NOTE — Telephone Encounter (Signed)
Patient's son, Yvone Neu, called in with concerns about patients HR. Patient told his son this morning he was feeling weak and that when he checked his BP, his heart rate was 35. At the time of this call, son was with patient and PT was there. PT checked his BP and it was 148/87 with HR of 66.  Rechecked with patient's BP cuff and HR was 68. He is not complaining of feeling weak now but does report sometimes feeling palpitations.   Patient has an appointment with Ambrose Pancoast, NP on 03/21/22.  Advised son to follow up at this appointment for evaluation. Also advised if patient records a low HR with his cuff to recheck it. If patient starts experiencing chest pain, SOB, palpitations, weakness, N/V, HR of less than 50, he needs to go to the ED for evaluation.  Son verbalized understanding.

## 2022-03-17 NOTE — Progress Notes (Deleted)
Office Visit    Patient Name: [name] Date of Encounter: 03/17/2022  Primary Care Provider:  Antony Contras, MD Primary Cardiologist:  Candee Furbish, MD Primary Electrophysiologist: None  Chief Complaint    [name] is a 88 y.o. male with PMH of HTN, HLD, GERD, hypotension, COPD, CHF, sleep apnea on BiPAP who presents today for follow-up of hypotension.  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)    Hypocalcemia    Memory difficulty 05/15/2017   Memory loss    Orthostatic dizziness    Polymyalgia rheumatica (HCC)    Prostatic hyperplasia    Restless leg syndrome    Skin cancer, basal cell    Sleep apnea    Transient global amnesia    Past Surgical History:  Procedure Laterality Date   [place] ARTHROPLASTY Right 05/13/2021   Procedure: REVERSE SHOULDER ARTHROPLASTY;  Surgeon: [name], MD;  Location: [facility];  Service: Orthopedics;  Laterality: Right;  with ISB   SHOULDER SURGERY Left     Allergies  Allergies  Allergen Reactions   Codeine Nausea And Vomiting    SICK   Penicillins Other (See Comments)    unknown    History of Present Illness   [name] is a 87 year old male with the above-mentioned past medical history presents today for 6-week follow-up of hypertension.  [name] was originally seen by Dr. Marlou Porch in 2018 for evaluation of possible heart failure.  2D echo was ordered with EF of 65-70%, moderate LVH with grade 1 DD and mild aortic valve regurg.cardiac catheterization report from 2016 which showed normal coronary arteries, normal left ventricular ejection fraction, normal left ventricular  end-diastolic pressure of 9 mmHg.  He was lost to follow-up until 12/2021 when he presented with orthostatic hypotension with vertigo.  In office orthostatics were positive but not dramatic.  Patient had been off of amlodipine, Flomax.  It was noted that his carvedilol was decreased to 3.125 mg twice daily.  He was recently referred to ENT for vertigo work-up.  He was restarted on amlodipine 2.5 mg due to blood pressure elevation.  No medication changes were made during visit.  Patient had follow-up scheduled for evaluation of possible vertigo.  Patient contacted triage nurse on 03/16/2022 with complaint of feeling weak and heart rate of 35 bpm.  Son was present and rechecked vital signs with blood pressure and heart rate of 66 bpm.  He was informed to go to the ED if heart rate is less than 40 bpm again.  Since last being seen in the office patient reports***.  Patient denies chest pain, palpitations, dyspnea, PND, orthopnea, nausea, vomiting, dizziness, syncope, edema, weight gain, or early satiety.   ***Notes:  Home Medications    Current Outpatient Medications  Medication Sig Dispense Refill   acetaminophen (TYLENOL) 500 MG tablet Take 500-1,000 mg by mouth every 6 (six) hours as needed (for pain).     albuterol (VENTOLIN HFA) 108 (90 Base) MCG/ACT inhaler Inhale 1-2 puffs into the lungs every 6 (six) hours as needed  for wheezing or shortness of breath.     ALPRAZolam (XANAX) 0.25 MG tablet Take 0.25 mg by mouth at bedtime.     amLODipine (NORVASC) 2.5 MG tablet Take 1 tablet (2.5 mg total) by mouth daily. 90 tablet 3   budesonide (PULMICORT) 0.5 MG/2ML nebulizer solution Take 0.5 mg by nebulization 2 (two) times daily as needed (wheezing/shortness of breath).     carvedilol (COREG) 3.125 MG tablet Take 3.125 mg by mouth 2 (two) times daily.     fluticasone (FLONASE) 50 MCG/ACT nasal spray Place 1 spray into both nostrils in the morning and at bedtime.     naphazoline-pheniramine (ALLERGY EYE)  0.025-0.3 % ophthalmic solution 1-2 drops 4 (four) times daily as needed for eye irritation.     pravastatin (PRAVACHOL) 40 MG tablet Take 40 mg by mouth in the morning.     rOPINIRole (REQUIP) 0.5 MG tablet Take 0.5 mg by mouth at bedtime.     No current facility-administered medications for this visit.     Review of Systems  Please see the history of present illness.    (+)*** (+)***  All other systems reviewed and are otherwise negative except as noted above.  Physical Exam    Wt Readings from Last 3 Encounters:  02/01/22 167 lb (75.8 kg)  12/19/21 170 lb (77.1 kg)  05/13/21 170 lb 3.2 oz (77.2 kg)   AV:WUJWJ were no vitals filed for this visit.,There is no height or weight on file to calculate BMI.  Constitutional:      Appearance: Healthy appearance. Not in distress.  Neck:     Vascular: JVD normal.  Pulmonary:     Effort: Pulmonary effort is normal.     Breath sounds: No wheezing. No rales. Diminished in the bases Cardiovascular:     Normal rate. Regular rhythm. Normal S1. Normal S2.      Murmurs: There is no murmur.  Edema:    Peripheral edema absent.  Abdominal:     Palpations: Abdomen is soft non tender. There is no hepatomegaly.  Skin:    General: Skin is warm and dry.  Neurological:     General: No focal deficit present.     Mental Status: Alert and oriented to person, place and time.     Cranial Nerves: Cranial nerves are intact.  EKG/LABS/Other Studies Reviewed    ECG personally reviewed by me today - ***  Risk Assessment/Calculations:   {Does this patient have ATRIAL FIBRILLATION?:[phone]}        Lab Results  Component Value Date   WBC 6.8 04/29/2021   HGB 12.5 (L) 05/14/2021   HCT 34.9 (L) 05/14/2021   MCV 93.8 04/29/2021   PLT 152 04/29/2021   Lab Results  Component Value Date   CREATININE 0.97 05/14/2021   BUN 19 05/14/2021   NA 134 (L) 05/14/2021   K 3.9 05/14/2021   CL 104 05/14/2021   CO2 25 05/14/2021   No results found  for: "ALT", "AST", "GGT", "ALKPHOS", "BILITOT" No results found for: "CHOL", "HDL", "LDLCALC", "LDLDIRECT", "TRIG", "CHOLHDL"  No results found for: "HGBA1C"  Assessment & Plan    1.  Orthostatic hypotension: -Patient blood pressure is much improved with with minimal orthostatic readings.  Patient was also slightly dizzy from lying to sitting position but resolved from sitting to standing. -We will not make any medication changes currently due to improvement and readings. -Patient has follow-up with ENT to discuss further possibility of vertigo.   2.  Essential hypertension: -Patient's  blood pressure today was 190/94 and was 134/80 on recheck -Currently on carvedilol 3.125 twice daily and amlodipine 2.5 mg daily   3.  Dyspnea on exertion -Patient has history of emphysema and chronic bronchitis -He is currently being followed by pulmonology and had no dyspnea during visit   4.  Hyperlipidemia: -Patient's most recent LDL cholesterol was 98  -Continue pravastatin 40 mg daily       Disposition: Follow-up with Candee Furbish, MD or APP in *** months {Are you ordering a CV Procedure (e.g. stress test, cath, DCCV, TEE, etc)?   Press F2        :[id number]}   Medication Adjustments/Labs and Tests Ordered: Current medicines are reviewed at length with the patient today.  Concerns regarding medicines are outlined above.   Signed, Mable Fill, Marissa Nestle, NP 03/17/2022, 10:03 AM [facility]  Note:  This document was prepared using Dragon voice recognition software and may include unintentional dictation errors.

## 2022-03-18 ENCOUNTER — Other Ambulatory Visit: Payer: Self-pay

## 2022-03-18 ENCOUNTER — Emergency Department (HOSPITAL_COMMUNITY): Payer: Medicare Other

## 2022-03-18 ENCOUNTER — Observation Stay (HOSPITAL_COMMUNITY)
Admission: EM | Admit: 2022-03-18 | Discharge: 2022-03-20 | Disposition: A | Discharge status: Home / Self Care | Payer: Medicare Other | Attending: Internal Medicine | Admitting: Internal Medicine

## 2022-03-18 ENCOUNTER — Encounter (HOSPITAL_COMMUNITY): Payer: Self-pay | Admitting: Emergency Medicine

## 2022-03-18 DIAGNOSIS — N401 Enlarged prostate with lower urinary tract symptoms: Secondary | ICD-10-CM | POA: Diagnosis not present

## 2022-03-18 DIAGNOSIS — R002 Palpitations: Secondary | ICD-10-CM | POA: Diagnosis not present

## 2022-03-18 DIAGNOSIS — Z79899 Other long term (current) drug therapy: Secondary | ICD-10-CM | POA: Insufficient documentation

## 2022-03-18 DIAGNOSIS — J438 Other emphysema: Secondary | ICD-10-CM | POA: Diagnosis not present

## 2022-03-18 DIAGNOSIS — Z87891 Personal history of nicotine dependence: Secondary | ICD-10-CM | POA: Insufficient documentation

## 2022-03-18 DIAGNOSIS — R001 Bradycardia, unspecified: Principal | ICD-10-CM

## 2022-03-18 DIAGNOSIS — R42 Dizziness and giddiness: Secondary | ICD-10-CM | POA: Diagnosis not present

## 2022-03-18 DIAGNOSIS — J42 Unspecified chronic bronchitis: Secondary | ICD-10-CM

## 2022-03-18 DIAGNOSIS — K219 Gastro-esophageal reflux disease without esophagitis: Secondary | ICD-10-CM

## 2022-03-18 DIAGNOSIS — N184 Chronic kidney disease, stage 4 (severe): Secondary | ICD-10-CM | POA: Diagnosis not present

## 2022-03-18 DIAGNOSIS — Z96611 Presence of right artificial shoulder joint: Secondary | ICD-10-CM | POA: Diagnosis not present

## 2022-03-18 DIAGNOSIS — G2581 Restless legs syndrome: Secondary | ICD-10-CM | POA: Diagnosis not present

## 2022-03-18 DIAGNOSIS — J449 Chronic obstructive pulmonary disease, unspecified: Secondary | ICD-10-CM | POA: Insufficient documentation

## 2022-03-18 DIAGNOSIS — I10 Essential (primary) hypertension: Secondary | ICD-10-CM

## 2022-03-18 DIAGNOSIS — I509 Heart failure, unspecified: Secondary | ICD-10-CM | POA: Diagnosis not present

## 2022-03-18 DIAGNOSIS — Z85828 Personal history of other malignant neoplasm of skin: Secondary | ICD-10-CM | POA: Diagnosis not present

## 2022-03-18 DIAGNOSIS — E785 Hyperlipidemia, unspecified: Secondary | ICD-10-CM

## 2022-03-18 DIAGNOSIS — R0602 Shortness of breath: Secondary | ICD-10-CM | POA: Diagnosis not present

## 2022-03-18 DIAGNOSIS — I130 Hypertensive heart and chronic kidney disease with heart failure and stage 1 through stage 4 chronic kidney disease, or unspecified chronic kidney disease: Secondary | ICD-10-CM | POA: Diagnosis not present

## 2022-03-18 DIAGNOSIS — J45909 Unspecified asthma, uncomplicated: Secondary | ICD-10-CM | POA: Insufficient documentation

## 2022-03-18 DIAGNOSIS — D649 Anemia, unspecified: Secondary | ICD-10-CM | POA: Diagnosis present

## 2022-03-18 LAB — BASIC METABOLIC PANEL
Anion gap: 8 (ref 5–15)
BUN: 10 mg/dL (ref 8–23)
CO2: 26 mmol/L (ref 22–32)
Calcium: 9.5 mg/dL (ref 8.9–10.3)
Chloride: 107 mmol/L (ref 98–111)
Creatinine, Ser: 0.93 mg/dL (ref 0.61–1.24)
GFR, Estimated: >60 mL/min (ref >60)
Glucose, Bld: 113 mg/dL — ABNORMAL HIGH (ref 70–99)
Potassium: 3.9 mmol/L (ref 3.5–5.1)
Sodium: 141 mmol/L (ref 135–145)

## 2022-03-18 LAB — CBC
HCT: 39.9 % (ref 39.0–52.0)
Hemoglobin: 14 g/dL (ref 13.0–17.0)
MCH: 34 pg (ref 26.0–34.0)
MCHC: 35.1 g/dL (ref 30.0–36.0)
MCV: 96.8 fL (ref 80.0–100.0)
Platelets: 179 10*3/uL (ref 150–400)
RBC: 4.12 MIL/uL — ABNORMAL LOW (ref 4.22–5.81)
RDW: 13.3 % (ref 11.5–15.5)
WBC: 7.2 10*3/uL (ref 4.0–10.5)
nRBC: 0 % (ref 0.0–0.2)

## 2022-03-18 LAB — MAGNESIUM: Magnesium: 2.1 mg/dL (ref 1.7–2.4)

## 2022-03-18 LAB — TSH: TSH: 4.466 u[IU]/mL (ref 0.350–4.500)

## 2022-03-18 LAB — TROPONIN I (HIGH SENSITIVITY)
Troponin I (High Sensitivity): 6 ng/L (ref <18)
Troponin I (High Sensitivity): 6 ng/L (ref <18)

## 2022-03-18 MED ORDER — ALPRAZOLAM 0.25 MG PO TABS: 0.2500 mg | ORAL_TABLET | Freq: Every evening | ORAL | Status: DC | PRN

## 2022-03-18 MED ORDER — PRAVASTATIN SODIUM 40 MG PO TABS
40.0000 mg | ORAL_TABLET | Freq: Every day | ORAL | Status: DC
  Administered 2022-03-19 – 2022-03-20 (×2): 40 mg via ORAL
  Filled 2022-03-18 (×2): qty 1

## 2022-03-18 MED ORDER — BUDESONIDE 0.5 MG/2ML IN SUSP: 0.5000 mg | Freq: Two times a day (BID) | RESPIRATORY_TRACT | Status: DC | PRN

## 2022-03-18 MED ORDER — TAMSULOSIN HCL 0.4 MG PO CAPS
0.4000 mg | ORAL_CAPSULE | Freq: Every day | ORAL | Status: DC
  Administered 2022-03-19: 0.4 mg via ORAL
  Filled 2022-03-18 (×2): qty 1

## 2022-03-18 MED ORDER — ALBUTEROL SULFATE HFA 108 (90 BASE) MCG/ACT IN AERS: 1.0000 | INHALATION_SPRAY | Freq: Four times a day (QID) | RESPIRATORY_TRACT | Status: DC | PRN

## 2022-03-18 MED ORDER — SODIUM CHLORIDE 0.9% FLUSH
3.0000 mL | Freq: Two times a day (BID) | INTRAVENOUS | Status: DC
  Administered 2022-03-18 – 2022-03-19 (×2): 3 mL via INTRAVENOUS

## 2022-03-18 MED ORDER — AMLODIPINE BESYLATE 2.5 MG PO TABS
2.5000 mg | ORAL_TABLET | Freq: Every day | ORAL | Status: DC
  Administered 2022-03-19: 2.5 mg via ORAL
  Filled 2022-03-18: qty 1

## 2022-03-18 MED ORDER — ENOXAPARIN SODIUM 40 MG/0.4ML IJ SOSY
40.0000 mg | PREFILLED_SYRINGE | INTRAMUSCULAR | Status: DC
  Administered 2022-03-18 – 2022-03-19 (×2): 40 mg via SUBCUTANEOUS
  Filled 2022-03-18 (×2): qty 0.4

## 2022-03-18 MED ORDER — ROPINIROLE HCL 1 MG PO TABS
0.5000 mg | ORAL_TABLET | Freq: Every day | ORAL | Status: DC
  Administered 2022-03-18 – 2022-03-19 (×2): 0.5 mg via ORAL
  Filled 2022-03-18 (×3): qty 1

## 2022-03-18 MED ORDER — ALPRAZOLAM 0.25 MG PO TABS: 0.2500 mg | ORAL_TABLET | Freq: Every day | ORAL | Status: DC

## 2022-03-18 MED ORDER — ACETAMINOPHEN 325 MG PO TABS
650.0000 mg | ORAL_TABLET | Freq: Four times a day (QID) | ORAL | Status: DC | PRN
  Administered 2022-03-19: 650 mg via ORAL
  Filled 2022-03-18: qty 2

## 2022-03-18 MED ORDER — ACETAMINOPHEN 650 MG RE SUPP: 650.0000 mg | Freq: Four times a day (QID) | RECTAL | Status: DC | PRN

## 2022-03-18 MED ORDER — ALBUTEROL SULFATE (2.5 MG/3ML) 0.083% IN NEBU: 2.5000 mg | INHALATION_SOLUTION | Freq: Four times a day (QID) | RESPIRATORY_TRACT | Status: DC | PRN

## 2022-03-18 MED ORDER — POLYETHYLENE GLYCOL 3350 17 G PO PACK: 17.0000 g | PACK | Freq: Every day | ORAL | Status: DC | PRN

## 2022-03-18 NOTE — ED Provider Notes (Signed)
[place] EMERGENCY DEPARTMENT Provider Note   CSN: [id number] Arrival date & time: 03/18/22  1736     History {Add pertinent medical, surgical, social history, OB history to HPI:1} Chief Complaint  Patient presents with   Palpitations    [name] is a 88 y.o. male.   Palpitations  The patient and the patient's son, the patient presents the emergency department today due to concern for episodes of bradycardia.  Patient states that he been feeling generally unwell having episodes of palpitation over the past 3 days.  Patient states that yesterday he took his heart rate and he noted that his heart rate was in the mid 30s.  While his heart rate was at low patient was having shortness of breath and dizziness.  He is feeling generally fatigued.  Patient states he has no history of significant cardiac history. He has no history of palpitations, chest pain, shortness of breath,  problems recent illnesses, fevers, chills, cough, congestion.  Patient denies any abdominal pain, dysuria, lower extremity swelling    Home Medications Prior to Admission medications   Medication Sig Start Date End Date Taking? Authorizing Provider  acetaminophen (TYLENOL) 500 MG tablet Take 500-1,000 mg by mouth every 6 (six) hours as needed (for pain).    [provider]  albuterol (VENTOLIN HFA) 108 (90 Base) MCG/ACT inhaler Inhale 1-2 puffs into the lungs every 6 (six) hours as needed for wheezing or shortness of breath.    [provider]  amLODipine (NORVASC) 2.5 MG tablet Take 1 tablet (2.5 mg total) by mouth daily. 12/19/21   Jerline Pain, MD  budesonide (PULMICORT) 0.5 MG/2ML nebulizer solution Take 0.5 mg by nebulization 2 (two) times daily as needed (wheezing/shortness of breath).    [provider]  carvedilol (COREG) 3.125 MG tablet Take 3.125 mg by mouth 2 (two) times daily. 11/24/21   [provider]  naphazoline-pheniramine (ALLERGY EYE)  0.025-0.3 % ophthalmic solution 1-2 drops 4 (four) times daily as needed for eye irritation.    [provider]  pravastatin (PRAVACHOL) 40 MG tablet Take 40 mg by mouth in the morning.    [provider]  rOPINIRole (REQUIP) 0.5 MG tablet Take 0.5 mg by mouth at bedtime. 10/06/21   [provider]  tamsulosin (FLOMAX) 0.4 MG CAPS capsule Take 0.4 mg by mouth daily. 03/06/22   [provider]      Allergies    Codeine and Penicillins    Review of Systems   Review of Systems  Cardiovascular:  Positive for palpitations.    Physical Exam Updated Vital Signs BP (!) 145/76   Pulse 64   Temp 98 F (36.7 C) (Oral)   Resp 19   SpO2 100%  Physical Exam Vitals and nursing note reviewed.  Constitutional:      General: He is not in acute distress.    Appearance: He is well-developed.     Comments: Elderly, chronically ill appearing  HENT:     Head: Normocephalic and atraumatic.  Eyes:     Conjunctiva/sclera: Conjunctivae normal.  Cardiovascular:     Rate and Rhythm: Normal rate and regular rhythm.     Heart sounds: No murmur heard. Pulmonary:     Effort: Pulmonary effort is normal. No respiratory distress.     Breath sounds: Normal breath sounds.  Abdominal:     Palpations: Abdomen is soft.     Tenderness: There is no abdominal tenderness.  Musculoskeletal:  General: No swelling.     Cervical back: Neck supple.  Skin:    General: Skin is warm and dry.     Capillary Refill: Capillary refill takes less than 2 seconds.  Neurological:     Mental Status: He is alert.  Psychiatric:        Mood and Affect: Mood normal.     ED Results / Procedures / Treatments   Labs (all labs ordered are listed, but only abnormal results are displayed) Labs Reviewed  BASIC METABOLIC PANEL - Abnormal; Notable for the following components:      Result Value   Glucose, Bld 113 (*)    All other components within normal limits  CBC - Abnormal; Notable for  the following components:   RBC 4.12 (*)    All other components within normal limits  MAGNESIUM  TSH  COMPREHENSIVE METABOLIC PANEL  CBC  TROPONIN I (HIGH SENSITIVITY)  TROPONIN I (HIGH SENSITIVITY)    EKG EKG Interpretation  Date/Time:  Saturday March 18 2022 17:24:50 EDT Ventricular Rate:  76 PR Interval:  148 QRS Duration: 126 QT Interval:  408 QTC Calculation: 459 R Axis:   -59 Text Interpretation: Sinus rhythm with frequent Premature ventricular complexes in a pattern of bigeminy Right bundle branch block Left anterior fascicular block *** Bifascicular block *** Abnormal ECG When compared with ECG of 29-Apr-2021 13:28, PREVIOUS ECG IS PRESENT bigeminy now present - underlying complexes unchanged from 2022 Confirmed by Noemi Chapel [phone]) on 03/18/2022 9:04:48 PM  Radiology DG Chest 2 View  Result Date: 03/18/2022 CLINICAL DATA:  Irregular slow heartbeat since yesterday, heart rate in the 30s, dizzy, lightheaded, short of breath EXAM: CHEST - 2 VIEW COMPARISON:  10/14/2016 FINDINGS: Rotated to the RIGHT. Normal heart size, mediastinal contours, and pulmonary vascularity. Atherosclerotic calcification aorta. Lungs clear. No pulmonary infiltrate, pleural effusion, or pneumothorax. Osseous structures unremarkable. IMPRESSION: No acute abnormalities. Aortic Atherosclerosis (ICD10-I70.0). Electronically Signed   By: Lavonia Dana M.D.   On: 03/18/2022 18:26    Procedures Procedures  {Document cardiac monitor, telemetry assessment procedure when appropriate:1}  Medications Ordered in ED Medications  amLODipine (NORVASC) tablet 2.5 mg (has no administration in time range)  pravastatin (PRAVACHOL) tablet 40 mg (has no administration in time range)  rOPINIRole (REQUIP) tablet 0.5 mg (has no administration in time range)  tamsulosin (FLOMAX) capsule 0.4 mg (has no administration in time range)  ALPRAZolam (XANAX) tablet 0.25 mg (has no administration in time range)  budesonide  (PULMICORT) nebulizer solution 0.5 mg (has no administration in time range)  albuterol (VENTOLIN HFA) 108 (90 Base) MCG/ACT inhaler 1-2 puff (has no administration in time range)  enoxaparin (LOVENOX) injection 40 mg (has no administration in time range)  sodium chloride flush (NS) 0.9 % injection 3 mL (has no administration in time range)  acetaminophen (TYLENOL) tablet 650 mg (has no administration in time range)    Or  acetaminophen (TYLENOL) suppository 650 mg (has no administration in time range)  polyethylene glycol (MIRALAX / GLYCOLAX) packet 17 g (has no administration in time range)    ED Course/ Medical Decision Making/ A&P                          Medical Decision Making Amount and/or Complexity of Data Reviewed Labs: ordered. Decision-making details documented in ED Course. Radiology: ordered. Decision-making details documented in ED Course. ECG/medicine tests:  Decision-making details documented in ED Course.  Risk OTC drugs. Decision regarding hospitalization.  Medical Decision Making  This patient is Presenting for Evaluation of palpitation, patient has a past medical history COPD, OSA, hypertension which complicates their presentation.  Of which does require a range of treatment options, and is a complaint that involves a high risk of morbidity and mortality.  Arrived in ED by:  EMS History obtained from: The patient, the patient's son Limitations in history: none    At this time I am most concerned for sick sinus syndrome. Also considering bradycardia dysrhythmia, frequent PVCs, bigeminy, metabolic abnormality, endocrine abnormality, structural abnormality. Plan for laboratory and imaging study work-up    EKG: NSR with  bifascicular block present, widening QRS noted.  PR interval 168, QTc 462.  Evidence of bigeminy   Laboratory work-up significant for:  -Patient's laboratory work-up obtained on arrival reassuring, CBC, BMP, initial troponin unremarkable.   Initial troponin 6.  Radiologic work-up was significant for: -Patient chest x-ray with no acute cardiopulmonary abnormalities   Interventions and Interval History: -While the patient's laboratory and imaging study work-up was reassuring, the patient was having episodes of symptomatic bradycardia causing him to be significantly short of breath .  Due to the patient's age, the patient's risk factors of smoking and hypertension, my high clinical concern for a bradycardia dysrhythmia versus a sick sinus syndrome, the patient is felt to require admission for telemetry monitoring and further cardiac work-up.      Consults: Cardiology Recommendations: will evaluated the patient during her hospitalization tomorrow    Additional documents reviewed: Outpatient clinic notes    Disposition: Due to the patients current presenting symptoms, physical exam findings, and the workup stated above, it is thought that the etiology of the patients current presentation is bradycardia dysrhythmia   ADMIT: Patient is thought to require admission for telemetry monitoring, further cardiac evaluation. Patient will be admitted to hospitalist service. Please see in patient provider note for additional treatment plan details.    The plan for this patient was discussed with Dr. Sabra Heck, who voiced agreement and who oversaw evaluation and treatment of this patient.     Clinical Complexity  A medically appropriate history, review of systems, and physical exam was performed.   I personally reviewed the lab and imaging studies discussed above.   MDM generated using voice dictation software and may contain dictation errors. Please contact me for any clarification or with any questions.         {Document critical care time when appropriate:1} {Document review of labs and clinical decision tools ie heart score, Chads2Vasc2 etc:1}  {Document your independent review of radiology images, and any outside  records:1} {Document your discussion with family members, caretakers, and with consultants:1} {Document social determinants of health affecting pt's care:1} {Document your decision making why or why not admission, treatments were needed:1} Final Clinical Impression(s) / ED Diagnoses Final diagnoses:  Palpitations    Rx / DC Orders ED Discharge Orders     None

## 2022-03-18 NOTE — H&P (Signed)
History and Physical   [name] [id number] DOB: 15-Oct-1932 DOA: 03/18/2022  PCP: Antony Contras, MD   Patient coming from: Home  Chief Complaint: Palpitations, bradycardia  HPI: [name] is a 88 y.o. male with medical history significant of emphysema, chronic bronchitis, hypertension, hyperlipidemia, anemia, GERD, BPH, RLS, insomnia, prostatitis presenting with palpitations and bradycardia.  Patient reports 3 days of palpitations on exertion and intermittently at rest.  Also reports associated dyspnea on exertion as well as weakness and dizziness.  Patient was taking his blood pressure 2 days ago and noted to have a heart rate in the 30s.  They called his cardiologist office and they recommended patient to report to the ED if heart rate is again less than 40, at the time of the call heart rate improved to the 60s.  Patient had recurrent symptoms and low heart rate he came to the ED for further evaluation. He denies fevers, chills, chest pain, abdominal pain, constipation, diarrhea, nausea, vomiting.  Son does note that patient was moved from CPAP to BiPAP for his OSA a while back and after some adjustments on BiPAP machine he had been unable to tolerate it has been without anything for almost a year.  ED Course: Vital signs in ED significant for blood pressure in the 140s to 160s.  No significant bradycardia in the ED.  ECG did show sinus rhythm with significant bigeminy at 76 bpm.  Right bundle branch block and significant baseline artifact.  Lab work-up included BMP with glucose 113.  CBC within normal limits.  Troponin negative with repeat pending.  Magnesium and TSH pending.  Chest x-ray showed no acute abnormality.  Patient had no intervention in the ED.  I requested ED to speak with cardiology for any initial recommendations beyond holding beta-blocker and to have patient evaluated by their service while here.  Review of Systems: As per HPI otherwise all other systems reviewed and  are negative.  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 His smoking use included cigarettes. He has never used smokeless tobacco. He reports that he does not drink alcohol and does not use drugs.  Allergies  Allergen Reactions   Codeine Nausea And Vomiting    SICK   Penicillins Other (See Comments)    unknown    Family History  Problem Relation Age of Onset   CVA Mother    Hypertension Mother  Cancer - Prostate Father    Cancer - Lung Brother    Cancer - Lung Brother   Reviewed on admission  Prior to Admission medications   Medication Sig Start Date End Date Taking? Authorizing Provider  acetaminophen (TYLENOL) 500 MG tablet Take 500-1,000 mg by mouth every 6 (six) hours as needed (for pain).    [provider]  albuterol (VENTOLIN HFA) 108 (90 Base) MCG/ACT inhaler Inhale 1-2 puffs into the lungs every 6 (six) hours as needed for wheezing or shortness of breath.    [provider]  ALPRAZolam Duanne Moron) 0.25 MG tablet Take 0.25 mg by mouth at bedtime. 07/06/21    [provider]  amLODipine (NORVASC) 2.5 MG tablet Take 1 tablet (2.5 mg total) by mouth daily. 12/19/21   Jerline Pain, MD  budesonide (PULMICORT) 0.5 MG/2ML nebulizer solution Take 0.5 mg by nebulization 2 (two) times daily as needed (wheezing/shortness of breath).    [provider]  carvedilol (COREG) 3.125 MG tablet Take 3.125 mg by mouth 2 (two) times daily. 11/24/21   [provider]  fluticasone (FLONASE) 50 MCG/ACT nasal spray Place 1 spray into both nostrils in the morning and at bedtime.    [provider]  naphazoline-pheniramine (ALLERGY EYE) 0.025-0.3 % ophthalmic solution 1-2 drops 4 (four) times daily as needed for eye irritation.    [provider]  pravastatin (PRAVACHOL) 40 MG tablet Take 40 mg by mouth in the morning.    [provider]  rOPINIRole (REQUIP) 0.5 MG tablet Take 0.5 mg by mouth at bedtime. 10/06/21   [provider]  tamsulosin (FLOMAX) 0.4 MG CAPS capsule Take 0.4 mg by mouth daily. 03/06/22   [provider]    Physical Exam: Vitals:   03/18/22 1742 03/18/22 2055 03/18/22 2130  BP: (!) 140/55 (!) 168/88 (!) 145/76  Pulse: 78 73 64  Resp: '18 18 19  '$ Temp: 97.8 F (36.6 C) 98 F (36.7 C)   TempSrc: Oral Oral   SpO2: 98% 99% 100%    Physical Exam Constitutional:      General: He is not in acute distress.    Appearance: Normal appearance.  HENT:     Head: Normocephalic and atraumatic.     Mouth/Throat:     Mouth: Mucous membranes are moist.     Pharynx: Oropharynx is clear.  Eyes:     Extraocular Movements: Extraocular movements intact.     Pupils: Pupils are equal, round, and reactive to light.  Cardiovascular:     Rate and Rhythm: Normal rate. Rhythm irregular.     Pulses: Normal pulses.     Heart sounds: Normal heart sounds.  Pulmonary:     Effort: Pulmonary effort is normal. No respiratory distress.     Breath sounds: Normal breath sounds.  Abdominal:     General:  Bowel sounds are normal. There is no distension.     Palpations: Abdomen is soft.     Tenderness: There is no abdominal tenderness.  Musculoskeletal:        General: No swelling or deformity.  Skin:    General: Skin is warm and dry.  Neurological:     General: No focal deficit present.     Mental Status: Mental status is at baseline.     Labs on Admission: I have personally reviewed following labs and imaging studies  CBC: Recent Labs  Lab 03/18/22 1746  WBC 7.2  HGB 14.0  HCT 39.9  MCV 96.8  PLT 179  Basic Metabolic Panel: Recent Labs  Lab 03/18/22 1746  NA 141  K 3.9  CL 107  CO2 26  GLUCOSE 113*  BUN 10  CREATININE 0.93  CALCIUM 9.5    GFR: CrCl cannot be calculated (Unknown ideal weight.).  Liver Function Tests: No results for input(s): "AST", "ALT", "ALKPHOS", "BILITOT", "PROT", "ALBUMIN" in the last 168 hours.  Urine analysis: No results found for: "COLORURINE", "APPEARANCEUR", "LABSPEC", "PHURINE", "GLUCOSEU", "HGBUR", "BILIRUBINUR", "KETONESUR", "PROTEINUR", "UROBILINOGEN", "NITRITE", "LEUKOCYTESUR"  Radiological Exams on Admission: DG Chest 2 View  Result Date: 03/18/2022 CLINICAL DATA:  Irregular slow heartbeat since yesterday, heart rate in the 30s, dizzy, lightheaded, short of breath EXAM: CHEST - 2 VIEW COMPARISON:  10/14/2016 FINDINGS: Rotated to the RIGHT. Normal heart size, mediastinal contours, and pulmonary vascularity. Atherosclerotic calcification aorta. Lungs clear. No pulmonary infiltrate, pleural effusion, or pneumothorax. Osseous structures unremarkable. IMPRESSION: No acute abnormalities. Aortic Atherosclerosis (ICD10-I70.0). Electronically Signed   By: Lavonia Dana M.D.   On: 03/18/2022 18:26    EKG: Independently reviewed.  Sinus bradycardia 76 bpm with bigeminy.  Right bundle blanch block.  Significant baseline artifact.  Assessment/Plan Principal Problem:   Symptomatic bradycardia Active Problems:   Other emphysema (HCC)    Essential hypertension   Anemia   Benign non-nodular prostatic hyperplasia with lower urinary tract symptoms   Chronic bronchitis (HCC)   Gastroesophageal reflux disease without esophagitis   Hyperlipidemia   Restless legs   Symptomatic bradycardia > Patient presenting with recurrent episodes of palpitations, dyspnea, weakness, dizziness especially on exertion.  At times he is noted to have heart rate as low as the 30s associated with the symptoms.  Concern for symptomatic bradycardia.  He did talk to his cardiologist earlier in the week and recommended that he come to the ED for further evaluation if symptomatic and heart rate less than 40s. > NAD currently not bradycardic and has sinus rhythm with bigeminy and right bundle branch block on ECG. > Initial work-up without electrolyte abnormality.  Magnesium is pending as well as TSH. > EDP is discussing case with cardiology to get their input and evaluation while here. - Appreciate cardiology recommendations - Monitor on telemetry - Hold home carvedilol - Follow-up magnesium and TSH - Supportive care  Hypertension - Continue amlodipine - Hold home carvedilol as above  Hyperlipidemia - Continue home pravastatin  BPH - Continue home tamsulosin  RLS - Continue home atenolol  Emphysema Chronic bronchitis - Continue home Pulmicort and as needed albuterol  OSA > This was to be on BiPAP at home but not tolerating it - CPAP nightly  DVT prophylaxis: Lovenox Code Status:   Full Family Communication:  Updated at bedside Disposition Plan:   Patient is from:  Home  Anticipated DC to:  Home  Anticipated DC date:  To 2 days  Anticipated DC barriers: None  Consults called:  EDP to discuss with cardiology. Admission status:  Observation, telemetry  Severity of Illness: The appropriate patient status for this patient is OBSERVATION. Observation status is judged to be reasonable and necessary in order to provide the required  intensity of service to ensure the patient's safety. The patient's presenting symptoms, physical exam findings, and initial radiographic and laboratory data in the context of their medical condition is felt to place them at decreased risk for further clinical deterioration. Furthermore, it is anticipated that the patient will be medically stable for discharge from the hospital within 2 midnights of admission.    Marcelyn Bruins MD [facility]  How to contact the [facility] Attending or Consulting provider [place] or covering provider during after hours [place], for this patient?   Check the care team in [facility] and look for a) attending/consulting [facility] provider listed and b) the [facility] team listed Log into [email or web address] and use [place]'s universal password to access. If you do not have the password, please contact the hospital operator. Locate the [facility] provider you are looking for under [facility] and page to a number that you can be directly reached. If you still have difficulty reaching the provider, please page the [facility] (Director on Call) for the Hospitalists listed on amion for assistance.  03/18/2022, 10:12 PM

## 2022-03-18 NOTE — ED Triage Notes (Addendum)
Patient from home BIB [facility] w/ complaints of palpitations for about 3 days, intermittently while at rest and with exertion. Patient endorses [facility] especially with exertion. Patient called his cardiology and was instructed if his HR dropped below 40 bpm he needs to come to the hospital. Per EMS patient HR was normal however throwing current PVCs, in bigeminy.  Patient Aox4. NAD at this time.   EMS gave 200 mls of NS  VSS per EMS.

## 2022-03-19 ENCOUNTER — Observation Stay (HOSPITAL_BASED_OUTPATIENT_CLINIC_OR_DEPARTMENT_OTHER): Payer: Medicare Other

## 2022-03-19 DIAGNOSIS — I10 Essential (primary) hypertension: Secondary | ICD-10-CM | POA: Diagnosis not present

## 2022-03-19 DIAGNOSIS — R001 Bradycardia, unspecified: Secondary | ICD-10-CM | POA: Diagnosis not present

## 2022-03-19 DIAGNOSIS — E785 Hyperlipidemia, unspecified: Secondary | ICD-10-CM | POA: Diagnosis not present

## 2022-03-19 DIAGNOSIS — R0609 Other forms of dyspnea: Secondary | ICD-10-CM | POA: Diagnosis not present

## 2022-03-19 DIAGNOSIS — K219 Gastro-esophageal reflux disease without esophagitis: Secondary | ICD-10-CM | POA: Diagnosis not present

## 2022-03-19 DIAGNOSIS — D649 Anemia, unspecified: Secondary | ICD-10-CM

## 2022-03-19 LAB — COMPREHENSIVE METABOLIC PANEL
ALT: 17 U/L (ref 0–44)
AST: 26 U/L (ref 15–41)
Albumin: 4.2 g/dL (ref 3.5–5.0)
Alkaline Phosphatase: 69 U/L (ref 38–126)
Anion gap: 8 (ref 5–15)
BUN: 11 mg/dL (ref 8–23)
CO2: 24 mmol/L (ref 22–32)
Calcium: 9.7 mg/dL (ref 8.9–10.3)
Chloride: 108 mmol/L (ref 98–111)
Creatinine, Ser: 0.9 mg/dL (ref 0.61–1.24)
GFR, Estimated: >60 mL/min (ref >60)
Glucose, Bld: 139 mg/dL — ABNORMAL HIGH (ref 70–99)
Potassium: 4 mmol/L (ref 3.5–5.1)
Sodium: 140 mmol/L (ref 135–145)
Total Bilirubin: 1.6 mg/dL — ABNORMAL HIGH (ref 0.3–1.2)
Total Protein: 7.5 g/dL (ref 6.5–8.1)

## 2022-03-19 LAB — CBC
HCT: 42.6 % (ref 39.0–52.0)
Hemoglobin: 15.3 g/dL (ref 13.0–17.0)
MCH: 33.5 pg (ref 26.0–34.0)
MCHC: 35.9 g/dL (ref 30.0–36.0)
MCV: 93.2 fL (ref 80.0–100.0)
Platelets: 165 10*3/uL (ref 150–400)
RBC: 4.57 MIL/uL (ref 4.22–5.81)
RDW: 13.1 % (ref 11.5–15.5)
WBC: 7.3 10*3/uL (ref 4.0–10.5)
nRBC: 0 % (ref 0.0–0.2)

## 2022-03-19 LAB — MRSA NEXT GEN BY PCR, NASAL: MRSA by PCR Next Gen: NOT DETECTED

## 2022-03-19 MED ORDER — AMLODIPINE BESYLATE 5 MG PO TABS
5.0000 mg | ORAL_TABLET | Freq: Every day | ORAL | Status: DC
  Administered 2022-03-20: 5 mg via ORAL
  Filled 2022-03-19: qty 1

## 2022-03-19 NOTE — Assessment & Plan Note (Addendum)
Improved heart rate, ectopy has improved.  It is possible that ectopy was culprit of low heart rate readings on home pulse monitor.  Plan to continue to hold carvedilol and continue telemetry monitor for 24 hrs more Check echocardiogram Will continue amlodipine for blood pressure control.  If recurrent ectopy may try low dose metoprolol.  Keep K at 4 and Mg at 2.  Out of bed to chair tid with meals OT and PT.

## 2022-03-19 NOTE — Hospital Course (Signed)
[name] was admitted to the hospital with the working diagnosis of bradycardia.   88 yo male with the past medical history of emphysema, hypertension, dyslipidemia, anemia and GERD who presented with palpitations. Reported 3 days of palpitations, feeling extra beats on exertion and intermittently at rest. Positive dyspnea on exertion along with generalized weakness and dizziness. His heart rate was int he 30's, his cardiologist was called and he was recommended to come to the ED if persistent bradycardia below 40 bpm. Patient presented to the ED and his blood pressure was 140/55, HR 78, RR 18 and 02 saturation 98%. Lungs with no wheezing or rales, heart with S1 and S2 present and irregular, abdomen with no distention and no lower extremity edema   Na 141, K 3,9 Cl 107 bicarbonate 26 glucose 113 bun 10 cr 0,93  Wbc 7,2 hgb 14 plt 179  Chest radiograph with right rotation, no cardiomegaly and no infiltrates, no effusions.  EKG 76 bpm, left axis deviation, right bundle branch block with normal qtc, sinus rhythm with PAC in a bigeminy pattern, no significant ST segment or T wave changes, noisy baseline.   B blocker was held and patient was admitted to the telemetry ward.  Patient with no improvement in ectopy, he has remained on sinus rhythm. Echocardiogram with preserved LV systolic function  Patient will continue blood pressure control with losartan.

## 2022-03-19 NOTE — Assessment & Plan Note (Signed)
Patient not on antiacid therapy.

## 2022-03-19 NOTE — Assessment & Plan Note (Signed)
Continue with statin therapy.  ?

## 2022-03-19 NOTE — Assessment & Plan Note (Signed)
hgb as been stable, continue to follow up as outpatient.

## 2022-03-19 NOTE — Progress Notes (Signed)
  Echocardiogram 2D Echocardiogram has been performed.  [name] 03/19/2022, 5:42 PM

## 2022-03-19 NOTE — Progress Notes (Signed)
  Progress Note   Patient: [name] VFI:[id number] DOB: 02/05/1933 DOA: 03/18/2022     0 DOS: the patient was seen and examined on 03/19/2022   Brief hospital course: Mr. [name] was admitted to the hospital with the working diagnosis of bradycardia.   88 yo male with the past medical history of emphysema, hypertension, dyslipidemia, anemia and GERD who presented with palpitations. Reported 3 days of palpitations on exertion and intermittently at rest. Positive dyspnea on exertion along with generalized weakness and dizziness. His heart rate was int he 30's, his cardiologist was called and he was recommended to come to the ED if persistent bradycardia below 40 bpm. Patient presented to the ED and his blood pressure was 140/55, HR 78, RR 18 and 02 saturation 98%. Lungs with no wheezing or rales, heart with S1 and S2 present and irregular, abdomen with no distention and no lower extremity edema   Na 141, K 3,9 Cl 107 bicarbonate 26 glucose 113 bun 10 cr 0,93  Wbc 7,2 hgb 14 plt 179  Chest radiograph with right rotation, no cardiomegaly and no infiltrates, no effusions.  EKG 76 bpm, left axis deviation, right bundle branch block with normal qtc, sinus rhythm with PAC in a bigeminy pattern, no significant ST segment or T wave changes, noisy baseline.   B blocker was held and patient was admitted to the telemetry ward.   Assessment and Plan: * Symptomatic bradycardia Improved heart rate, ectopy has improved.  It is possible that ectopy was culprit of low heart rate readings on home pulse monitor.  Plan to continue to hold carvedilol and continue telemetry monitor for 24 hrs more Check echocardiogram Will continue amlodipine for blood pressure control.  If recurrent ectopy may try low dose metoprolol.  Keep K at 4 and Mg at 2.  Out of bed to chair tid with meals OT and PT.   Essential hypertension Continue blood pressure control with amlodipine with close follow up on heart rate.    Hyperlipidemia Continue with statin therapy.   Chronic bronchitis (HCC) No clinical signs of exacerbation   Anemia hgb as been stable, continue to follow up as outpatient.   Gastroesophageal reflux disease without esophagitis Patient not on antiacid therapy.         Subjective: Patient with no chest pain or dyspnea, no palpitations   Physical Exam: Vitals:   03/19/22 1100 03/19/22 1145 03/19/22 1159 03/19/22 1451  BP: (!) 158/71 (!) 145/75  (!) 155/83  Pulse: 65 64  81  Resp: '15 19  20  '$ Temp:   97.7 F (36.5 C) (!) 97.5 F (36.4 C)  TempSrc:   Oral Oral  SpO2: 100% 95%  98%  Weight:    74.8 kg  Height:    '5\' 9"'$  (1.753 m)   Neurology awake and alert ENT with mild pallor Cardiovascular with S1 and S2 present and rhythmic with no gallops or rubs, no murmurs Respiratory with no rales or wheezing Abdomen with no distention  Data Reviewed:    Family Communication: I spoke with patient's son and daughter in law at the bedside, we talked in detail about patient's condition, plan of care and prognosis and all questions were addressed.   Disposition: Status is: Observation The patient remains OBS appropriate and will d/c before 2 midnights.  Planned Discharge Destination: Home      Author: Tawni Millers, MD 03/19/2022 4:05 PM  For on call review [email or web address].

## 2022-03-19 NOTE — Assessment & Plan Note (Signed)
No clinical signs of exacerbation  

## 2022-03-19 NOTE — Plan of Care (Signed)

## 2022-03-19 NOTE — Assessment & Plan Note (Signed)
Continue blood pressure control with losartan

## 2022-03-20 ENCOUNTER — Other Ambulatory Visit (HOSPITAL_COMMUNITY): Payer: Self-pay

## 2022-03-20 ENCOUNTER — Encounter: Payer: Self-pay | Admitting: Internal Medicine

## 2022-03-20 DIAGNOSIS — E785 Hyperlipidemia, unspecified: Secondary | ICD-10-CM | POA: Diagnosis not present

## 2022-03-20 DIAGNOSIS — K219 Gastro-esophageal reflux disease without esophagitis: Secondary | ICD-10-CM | POA: Diagnosis not present

## 2022-03-20 DIAGNOSIS — I10 Essential (primary) hypertension: Secondary | ICD-10-CM | POA: Diagnosis not present

## 2022-03-20 DIAGNOSIS — D649 Anemia, unspecified: Secondary | ICD-10-CM | POA: Diagnosis not present

## 2022-03-20 DIAGNOSIS — R001 Bradycardia, unspecified: Secondary | ICD-10-CM | POA: Diagnosis not present

## 2022-03-20 LAB — BASIC METABOLIC PANEL
Anion gap: 9 (ref 5–15)
BUN: 15 mg/dL (ref 8–23)
CO2: 23 mmol/L (ref 22–32)
Calcium: 9.6 mg/dL (ref 8.9–10.3)
Chloride: 107 mmol/L (ref 98–111)
Creatinine, Ser: 1.06 mg/dL (ref 0.61–1.24)
GFR, Estimated: >60 mL/min (ref >60)
Glucose, Bld: 118 mg/dL — ABNORMAL HIGH (ref 70–99)
Potassium: 3.6 mmol/L (ref 3.5–5.1)
Sodium: 139 mmol/L (ref 135–145)

## 2022-03-20 LAB — ECHOCARDIOGRAM COMPLETE
Area-P 1/2: 2.82 cm2
Height: 69 in
S' Lateral: 2.8 cm
Weight: 2638.47 oz

## 2022-03-20 LAB — MAGNESIUM: Magnesium: 2.1 mg/dL (ref 1.7–2.4)

## 2022-03-20 MED ORDER — LOSARTAN POTASSIUM 25 MG PO TABS
25.0000 mg | ORAL_TABLET | Freq: Every day | ORAL | 0 refills | Status: DC
  Filled 2022-03-20: qty 30, 30d supply, fill #0

## 2022-03-20 MED ORDER — LOSARTAN POTASSIUM 25 MG PO TABS: 25.0000 mg | ORAL_TABLET | Freq: Every day | ORAL | Status: DC

## 2022-03-20 NOTE — Progress Notes (Signed)
Mobility Specialist Progress Note    03/20/22 1102  Mobility  Activity Ambulated with assistance in hallway  Activity Response Tolerated well  Distance Ambulated (ft) 350 ft  $Mobility charge 1 Mobility  Level of Assistance Contact guard assist, steadying assist  Assistive Device None   Pre-Mobility: 65 HR, 94% SpO2 During Mobility: 97 HR, 95% SpO2 Post-Mobility: 74 HR, 95% SpO2  Pt received in bed and agreeable. No complaints on walk. Returned to chair with call bell in reach.    Hildred Alamin Mobility Specialist

## 2022-03-20 NOTE — Progress Notes (Signed)
RN went over d/c summary w/ pt and pt's son and NT removed PIV. Waiting on TOC meds to be delivered.   Pt has TOC meds and belongings. Pt being transported to private vehicle where pt's son will transport him home.

## 2022-03-20 NOTE — Discharge Summary (Signed)
Physician Discharge Summary   Patient: [name] MRN: [id number] DOB: 1933/01/04  Admit date:     03/18/2022  Discharge date: 03/20/22  Discharge Physician: Jimmy Picket Rebbie Lauricella   PCP: Antony Contras, MD   Recommendations at discharge:    Carvedilol and amlodipine have been discontinued Patient has been placed on losartan for blood pressure control Follow up with Cardiology in 7 to 10 days Follow up with Dr Moreen Fowler in 7 days.    Discharge Diagnoses: Principal Problem:   Symptomatic bradycardia Active Problems:   Essential hypertension   Hyperlipidemia   Chronic bronchitis (HCC)   Anemia   Gastroesophageal reflux disease without esophagitis  Resolved Problems:   Dyslipidemia  Hospital Course: Mr. Blankenship was admitted to the hospital with the working diagnosis of bradycardia.   88 yo male with the past medical history of emphysema, hypertension, dyslipidemia, anemia and GERD who presented with palpitations. Reported 3 days of palpitations on exertion and intermittently at rest. Positive dyspnea on exertion along with generalized weakness and dizziness. His heart rate was int he 30's, his cardiologist was called and he was recommended to come to the ED if persistent bradycardia below 40 bpm. Patient presented to the ED and his blood pressure was 140/55, HR 78, RR 18 and 02 saturation 98%. Lungs with no wheezing or rales, heart with S1 and S2 present and irregular, abdomen with no distention and no lower extremity edema   Na 141, K 3,9 Cl 107 bicarbonate 26 glucose 113 bun 10 cr 0,93  Wbc 7,2 hgb 14 plt 179  Chest radiograph with right rotation, no cardiomegaly and no infiltrates, no effusions.  EKG 76 bpm, left axis deviation, right bundle branch block with normal qtc, sinus rhythm with PAC in a bigeminy pattern, no significant ST segment or T wave changes, noisy baseline.   B blocker was held and patient was admitted to the telemetry ward.  Patient with no improvement in  ectopy, he has remained on sinus rhythm. Echocardiogram with preserved LV systolic function  Patient will continue blood pressure control with losartan.   Assessment and Plan: * Symptomatic bradycardia Improved heart rate, ectopy has improved.  It is possible that ectopy was culprit of low heart rate readings on home heart monitor.  Further work up with echocardiogram with preserved LV systolic function with EF 60 to 29%, RV systolic function preserved, no significant valvular disease.   Plan to continue blood pressure control with losartan Will discontinue carvedilol and amlodipine for now. Follow up as outpatient with Cardiology   Essential hypertension Continue blood pressure control with losartan    Hyperlipidemia Continue with statin therapy.   Chronic bronchitis (HCC) No clinical signs of exacerbation   Anemia hgb as been stable, continue to follow up as outpatient.   Gastroesophageal reflux disease without esophagitis Patient not on antiacid therapy.          Consultants: none Procedures performed: none  Disposition: Home Diet recommendation:  Cardiac diet DISCHARGE MEDICATION: Allergies as of 03/20/2022       Reactions   Codeine Nausea And Vomiting   SICK   Penicillins Other (See Comments)   unknown        Medication List     STOP taking these medications    amLODipine 2.5 MG tablet Commonly known as: NORVASC   carvedilol 3.125 MG tablet Commonly known as: COREG       TAKE these medications    acetaminophen 500 MG tablet Commonly known as: TYLENOL Take 500-1,000 mg  by mouth every 6 (six) hours as needed (for pain).   albuterol 108 (90 Base) MCG/ACT inhaler Commonly known as: VENTOLIN HFA Inhale 1-2 puffs into the lungs every 6 (six) hours as needed for wheezing or shortness of breath.   budesonide 0.5 MG/2ML nebulizer solution Commonly known as: PULMICORT Take 0.5 mg by nebulization 2 (two) times daily as needed  (wheezing/shortness of breath).   losartan 25 MG tablet Commonly known as: COZAAR Take 1 tablet (25 mg total) by mouth daily. Start taking on: March 21, 2022   pravastatin 40 MG tablet Commonly known as: PRAVACHOL Take 40 mg by mouth in the morning.   rOPINIRole 0.5 MG tablet Commonly known as: REQUIP Take 0.5 mg by mouth at bedtime as needed (restless legs).   tamsulosin 0.4 MG Caps capsule Commonly known as: FLOMAX Take 0.4 mg by mouth daily as needed (prostate health).        Discharge Exam: Filed Weights   03/19/22 1451  Weight: 74.8 kg   BP 138/83 (BP Location: Right Arm)   Pulse 71   Temp (!) 97.5 F (36.4 C) (Oral)   Resp 14   Ht '5\' 9"'$  (1.753 m)   Wt 74.8 kg   SpO2 97%   BMI 24.35 kg/m   Patient with no chest pain or dyspnea, no palpitations   Neurology awake and alert ENT with no pallor Cardiovascular with S1 and S2 present and rhythmic with no gallops, rubs or murmurs Respiratory with no rales or wheezing Abdomen with no distention No lower extremity edema   Condition at discharge: stable  The results of significant diagnostics from this hospitalization (including imaging, microbiology, ancillary and laboratory) are listed below for reference.   Imaging Studies: ECHOCARDIOGRAM COMPLETE  Result Date: 03/20/2022    ECHOCARDIOGRAM REPORT   Patient Name:   [name] Date of Exam: 03/19/2022 Medical Rec #:  [id number]    Height:       69.0 in Accession #:    [id number]   Weight:       164.9 lb Date of Birth:  [birth year]    BSA:          1.903 m Patient Age:    61 years     BP:           155/83 mmHg Patient Gender: M            HR:           58 bpm. Exam Location:  Inpatient Procedure: 2D Echo, Cardiac Doppler and Color Doppler Indications:    dyspnea  History:        Patient has prior history of Echocardiogram examinations, most                 recent 03/29/2021. Arrythmias:Bradycardia; Risk                 Factors:Hypertension and Dyslipidemia.   Sonographer:    Johny Chess RDCS Referring Phys: [id number] [place]  Sonographer Comments: Technically difficult study due to poor echo windows. Image acquisition challenging due to respiratory motion. IMPRESSIONS  1. Left ventricular ejection fraction, by estimation, is 60 to 65%. The left ventricle has normal function. The left ventricle has no regional wall motion abnormalities. Left ventricular diastolic parameters are consistent with Grade I diastolic dysfunction (impaired relaxation).  2. Right ventricular systolic function is normal. The right ventricular size is normal.  3. The mitral valve is normal in structure. No evidence of mitral valve regurgitation.  No evidence of mitral stenosis.  4. The aortic valve is normal in structure. Aortic valve regurgitation is not visualized. No aortic stenosis is present.  5. The inferior vena cava is normal in size with greater than 50% respiratory variability, suggesting right atrial pressure of 3 mmHg. FINDINGS  Left Ventricle: Left ventricular ejection fraction, by estimation, is 60 to 65%. The left ventricle has normal function. The left ventricle has no regional wall motion abnormalities. The left ventricular internal cavity size was normal in size. There is  no left ventricular hypertrophy. Left ventricular diastolic parameters are consistent with Grade I diastolic dysfunction (impaired relaxation).  LV Wall Scoring: The posterior wall is normal. Right Ventricle: The right ventricular size is normal. No increase in right ventricular wall thickness. Right ventricular systolic function is normal. Left Atrium: Left atrial size was normal in size. Right Atrium: Right atrial size was normal in size. Pericardium: There is no evidence of pericardial effusion. Mitral Valve: The mitral valve is normal in structure. Mild mitral annular calcification. No evidence of mitral valve regurgitation. No evidence of mitral valve stenosis. Tricuspid Valve: The  tricuspid valve is normal in structure. Tricuspid valve regurgitation is not demonstrated. No evidence of tricuspid stenosis. Aortic Valve: The aortic valve is normal in structure. Aortic valve regurgitation is not visualized. No aortic stenosis is present. Pulmonic Valve: The pulmonic valve was normal in structure. Pulmonic valve regurgitation is not visualized. No evidence of pulmonic stenosis. Aorta: The aortic root is normal in size and structure. Venous: The inferior vena cava is normal in size with greater than 50% respiratory variability, suggesting right atrial pressure of 3 mmHg. IAS/Shunts: No atrial level shunt detected by color flow Doppler.  LEFT VENTRICLE PLAX 2D LVIDd:         4.00 cm   Diastology LVIDs:         2.80 cm   LV e' medial:    8.16 cm/s LV PW:         0.90 cm   LV E/e' medial:  9.0 LV IVS:        1.00 cm   LV e' lateral:   7.29 cm/s LVOT diam:     2.00 cm   LV E/e' lateral: 10.1 LV SV:         75 LV SV Index:   40 LVOT Area:     3.14 cm  RIGHT VENTRICLE             IVC RV S prime:     13.10 cm/s  IVC diam: 1.50 cm LEFT ATRIUM             Index LA diam:        3.90 cm 2.05 cm/m LA Vol (A2C):   59.6 ml 31.31 ml/m LA Vol (A4C):   52.1 ml 27.37 ml/m LA Biplane Vol: 57.5 ml 30.21 ml/m  AORTIC VALVE LVOT Vmax:   115.00 cm/s LVOT Vmean:  64.600 cm/s LVOT VTI:    0.240 m  AORTA Ao Root diam: 3.70 cm Ao Asc diam:  3.75 cm MITRAL VALVE MV Area (PHT): 2.82 cm    SHUNTS MV Decel Time: 269 msec    Systemic VTI:  0.24 m MV E velocity: 73.70 cm/s  Systemic Diam: 2.00 cm MV A velocity: 79.80 cm/s MV E/A ratio:  0.92 Candee Furbish MD Electronically signed by Candee Furbish MD Signature Date/Time: 03/20/2022/12:15:06 PM    Final    DG Chest 2 View  Result Date: 03/18/2022 CLINICAL DATA:  Irregular slow heartbeat since yesterday, heart rate in the 30s, dizzy, lightheaded, short of breath EXAM: CHEST - 2 VIEW COMPARISON:  10/14/2016 FINDINGS: Rotated to the RIGHT. Normal heart size, mediastinal contours,  and pulmonary vascularity. Atherosclerotic calcification aorta. Lungs clear. No pulmonary infiltrate, pleural effusion, or pneumothorax. Osseous structures unremarkable. IMPRESSION: No acute abnormalities. Aortic Atherosclerosis (ICD10-I70.0). Electronically Signed   By: Lavonia Dana M.D.   On: 03/18/2022 18:26    Microbiology: Results for orders placed or performed during the hospital encounter of 03/18/22  MRSA Next Gen by PCR, Nasal     Status: None   Collection Time: 03/19/22  3:06 PM   Specimen: Nasal Mucosa; Nasal Swab  Result Value Ref Range Status   MRSA by PCR Next Gen NOT DETECTED NOT DETECTED Final    Comment: (NOTE) The GeneXpert MRSA Assay (FDA approved for NASAL specimens only), is one component of a comprehensive MRSA colonization surveillance program. It is not intended to diagnose MRSA infection nor to guide or monitor treatment for MRSA infections. Test performance is not FDA approved in patients less than 16 years old. Performed at [facility], [place] [address], [place], [place] [address]     Labs: CBC: Recent Labs  Lab 03/18/22 1746 03/19/22 1243  WBC 7.2 7.3  HGB 14.0 15.3  HCT 39.9 42.6  MCV 96.8 93.2  PLT 179 786   Basic Metabolic Panel: Recent Labs  Lab 03/18/22 1746 03/18/22 2159 03/19/22 1243 03/20/22 0042  NA 141  --  140 139  K 3.9  --  4.0 3.6  CL 107  --  108 107  CO2 26  --  24 23  GLUCOSE 113*  --  139* 118*  BUN 10  --  11 15  CREATININE 0.93  --  0.90 1.06  CALCIUM 9.5  --  9.7 9.6  MG  --  2.1  --  2.1   Liver Function Tests: Recent Labs  Lab 03/19/22 1243  AST 26  ALT 17  ALKPHOS 69  BILITOT 1.6*  PROT 7.5  ALBUMIN 4.2   CBG: No results for input(s): "GLUCAP" in the last 168 hours.  Discharge time spent: greater than 30 minutes.  Signed: Tawni Millers, MD [facility] 03/20/2022

## 2022-03-21 ENCOUNTER — Ambulatory Visit: Payer: Medicare Other | Admitting: Nurse Practitioner

## 2022-03-21 DIAGNOSIS — I951 Orthostatic hypotension: Secondary | ICD-10-CM

## 2022-03-24 DIAGNOSIS — H81399 Other peripheral vertigo, unspecified ear: Secondary | ICD-10-CM | POA: Diagnosis not present

## 2022-03-24 DIAGNOSIS — Z23 Encounter for immunization: Secondary | ICD-10-CM | POA: Diagnosis not present

## 2022-03-24 DIAGNOSIS — R5381 Other malaise: Secondary | ICD-10-CM | POA: Diagnosis not present

## 2022-03-24 DIAGNOSIS — I130 Hypertensive heart and chronic kidney disease with heart failure and stage 1 through stage 4 chronic kidney disease, or unspecified chronic kidney disease: Secondary | ICD-10-CM | POA: Diagnosis not present

## 2022-03-27 ENCOUNTER — Ambulatory Visit: Payer: Medicare Other | Attending: Cardiology | Admitting: Cardiology

## 2022-03-27 ENCOUNTER — Ambulatory Visit (INDEPENDENT_AMBULATORY_CARE_PROVIDER_SITE_OTHER): Payer: Medicare Other

## 2022-03-27 ENCOUNTER — Encounter: Payer: Self-pay | Admitting: Cardiology

## 2022-03-27 VITALS — BP 140/90 | HR 74 | Ht 69.0 in | Wt 164.0 lb

## 2022-03-27 DIAGNOSIS — I493 Ventricular premature depolarization: Secondary | ICD-10-CM

## 2022-03-27 DIAGNOSIS — R9431 Abnormal electrocardiogram [ECG] [EKG]: Secondary | ICD-10-CM | POA: Diagnosis not present

## 2022-03-27 DIAGNOSIS — I951 Orthostatic hypotension: Secondary | ICD-10-CM | POA: Insufficient documentation

## 2022-03-27 NOTE — Patient Instructions (Signed)
Medication Instructions:  The current medical regimen is effective;  continue present plan and medications.  *If you need a refill on your cardiac medications before your next appointment, please call your pharmacy*  Testing/Procedures: [place] Monitor Instructions  Your physician has requested you wear a ZIO patch monitor for 14 days.  This is a single patch monitor. [employer] supplies one patch monitor per enrollment. Additional stickers are not available. Please do not apply patch if you will be having a Nuclear Stress Test,  Echocardiogram, Cardiac CT, MRI, or Chest Xray during the period you would be wearing the  monitor. The patch cannot be worn during these tests. You cannot remove and re-apply the  ZIO XT patch monitor.  Your ZIO patch monitor will be mailed 3 day USPS to your address on file. It may take 3-5 days  to receive your monitor after you have been enrolled.  Once you have received your monitor, please review the enclosed instructions. Your monitor  has already been registered assigning a specific monitor serial # to you.  Billing and Patient Assistance Program Information  We have supplied [employer] with any of your insurance information on file for billing purposes. [employer] offers a sliding scale Patient Assistance Program for patients that do not have  insurance, or whose insurance does not completely cover the cost of the ZIO monitor.  You must apply for the Patient Assistance Program to qualify for this discounted rate.  To apply, please call [employer] at [phone], select option 4, select option 2, ask to apply for  Patient Assistance Program. Theodore Demark will ask your household income, and how many people  are in your household. They will quote your out-of-pocket cost based on that information.  [employer] will also be able to set up a 24-month interest-free payment plan if needed.  Applying the monitor   Shave hair from upper left chest.  Hold abrader disc  by orange tab. Rub abrader in 40 strokes over the upper left chest as  indicated in your monitor instructions.  Clean area with 4 enclosed alcohol pads. Let dry.  Apply patch as indicated in monitor instructions. Patch will be placed under collarbone on left  side of chest with arrow pointing upward.  Rub patch adhesive wings for 2 minutes. Remove white label marked "1". Remove the white  label marked "2". Rub patch adhesive wings for 2 additional minutes.  While looking in a mirror, press and release button in center of patch. A small green light will  flash 3-4 times. This will be your only indicator that the monitor has been turned on.  Do not shower for the first 24 hours. You may shower after the first 24 hours.  Press the button if you feel a symptom. You will hear a small click. Record Date, Time and  Symptom in the Patient Logbook.  When you are ready to remove the patch, follow instructions on the last 2 pages of Patient  Logbook. Stick patch monitor onto the last page of Patient Logbook.  Place Patient Logbook in the blue and white box. Use locking tab on box and tape box closed  securely. The blue and white box has prepaid postage on it. Please place it in the mailbox as  soon as possible. Your physician should have your test results approximately 7 days after the  monitor has been mailed back to [facility]  Call [facility] [phone] you have questions regarding  your ZIO XT patch  monitor. Call them immediately if you see an orange light blinking on your  monitor.  If your monitor falls off in less than 4 days, contact our Monitor department at [phone].  If your monitor becomes loose or falls off after 4 days call [employer] at [phone] for  suggestions on securing your monitor    Follow-Up: At [facility], you and your health needs are our priority.  As part of our continuing mission to provide you with exceptional heart care,  we have created designated Provider Care Teams.  These Care Teams include your primary Cardiologist (physician) and Advanced Practice Providers (APPs -  Physician Assistants and Nurse Practitioners) who all work together to provide you with the care you need, when you need it.  We recommend signing up for the patient portal called "MyChart".  Sign up information is provided on this After Visit Summary.  MyChart is used to connect with patients for Virtual Visits (Telemedicine).  Patients are able to view lab/test results, encounter notes, upcoming appointments, etc.  Non-urgent messages can be sent to your provider as well.   To learn more about what you can do with MyChart, go to [email or web address].    Your next appointment:   6 month(s)  The format for your next appointment:   In Person  Provider:   Candee Furbish, MD     Important Information About Sugar

## 2022-03-27 NOTE — Progress Notes (Unsigned)
Enrolled for [employer] to mail a ZIO XT long term holter monitor to the patients address on file.  

## 2022-03-27 NOTE — Progress Notes (Signed)
Cardiology Office Note:    Date:  03/27/2022   ID:  [name], DOB 1932/10/31, MRN [id number]  PCP:  Antony Contras, MD   [facility] Providers Cardiologist:  Candee Furbish, MD     Referring MD: Antony Contras, MD    History of Present Illness:    [name] is a 88 y.o. male here for the follow-up of bradycardia.  Previously here for the evaluation of orthostatic hypotension/vertigo at the request of Dr. Antony Contras.  Apparently according to prior outside office note HPI he was having dizziness when he laid down/got up.  Walks down the hallway has to hold onto walls sometimes.  Clearly has 2 separate processes, 1 is a vertiginous-like symptom and one is a decrease in blood pressure when standing.  They did orthostatics and he was 180/89 laying, 168/85 sitting, 121/67 standing.  Here his orthostatics were not quite as dramatic at 170 laying, 180 sitting, 170 standing and then 160 standing for 3 minutes.  This is while he has been off of the amlodipine as well as Flomax and decreased carvedilol.  Has a history of polymyalgia rheumatica, COPD.  His carvedilol was decreased to 3.125 mg twice a day.  Tamsulosin was stopped.  He was referred to ENT for recurrent vertigo.  Creatinine was normal CBC normal LDL 98.  Today: He is accompanied by a family member and presents a blood pressure log. On personal review this shows generally stable blood pressures. At times it has been as low as 956 systolic, which he is able to feel. He usually feels better if his blood pressure is higher, such as in the 387F systolic. He confirms that he is not taking carvedilol and amlodipine. Since 3 weeks ago his dizziness seems to have resolved.  Lately he has been feeling "kind of weak" with low energy levels.  Additionally he complains of noticeable skipped beats, especially while lying down at night.  He denies any chest pain, shortness of breath, or peripheral edema. No lightheadedness, headaches,  syncope, orthopnea, or PND.    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)    Hypocalcemia    Memory difficulty 05/15/2017   Memory loss    Orthostatic dizziness    Polymyalgia rheumatica (HCC)    Prostatic hyperplasia    Restless leg syndrome    Skin cancer, basal cell    Sleep apnea    Transient global amnesia     Past Surgical History:  Procedure Laterality Date   [place] ARTHROPLASTY Right 05/13/2021   Procedure: REVERSE SHOULDER ARTHROPLASTY;  Surgeon: [name], MD;  Location: [facility];  Service: Orthopedics;  Laterality: Right;  with ISB   SHOULDER SURGERY Left     Current Medications: Current Meds  Medication Sig   acetaminophen (TYLENOL) 500 MG tablet Take 500-1,000 mg by mouth every 6 (six) hours as needed (for pain).   albuterol (VENTOLIN HFA) 108 (90 Base) MCG/ACT inhaler Inhale 1-2 puffs into the lungs every 6 (six) hours as needed for wheezing or shortness of breath.   budesonide (PULMICORT) 0.5 MG/2ML nebulizer solution  Take 0.5 mg by nebulization 2 (two) times daily as needed (wheezing/shortness of breath).   losartan (COZAAR) 25 MG tablet Take 1 tablet (25 mg total) by mouth daily.   pravastatin (PRAVACHOL) 40 MG tablet Take 40 mg by mouth in the morning.   rOPINIRole (REQUIP) 0.5 MG tablet Take 0.5 mg by mouth at bedtime as needed (restless legs).   tamsulosin (FLOMAX) 0.4 MG CAPS capsule Take 0.4 mg by mouth daily as needed (prostate health).     Allergies:   Codeine and Penicillins   Social History   Socioeconomic History   Marital status: Married    Spouse name: Not on file   Number of children: 2   Years of education: 12    Highest education level: Not on file  Occupational History   Not on file  Tobacco Use   Smoking status: Former    Types: Cigarettes    Quit date: 07/10/1978    Years since quitting: 43.7   Smokeless tobacco: Never  Vaping Use   Vaping Use: Never used  Substance and Sexual Activity   Alcohol use: No   Drug use: No   Sexual activity: Never  Other Topics Concern   Not on file  Social History Narrative      Caffeine use: coffee/coke daily   Right handed    Social Determinants of Health   Financial Resource Strain: Not on file  Food Insecurity: Not on file  Transportation Needs: Not on file  Physical Activity: Not on file  Stress: Not on file  Social Connections: Not on file     Family History: The patient's family history includes CVA in his mother; Cancer - Lung in his brother and brother; Cancer - Prostate in his father; Hypertension in his mother.  ROS:   Please see the history of present illness.    (+) Generalized weakness (+) Fatigue (+) Palpitations All other systems reviewed and are negative.  EKGs/Labs/Other Studies Reviewed:    The following studies were reviewed today:  Echo  03/19/2022: Sonographer Comments: Technically difficult study due to poor echo  windows. Image acquisition challenging due to respiratory motion.   IMPRESSIONS   1. Left ventricular ejection fraction, by estimation, is 60 to 65%. The  left ventricle has normal function. The left ventricle has no regional  wall motion abnormalities. Left ventricular diastolic parameters are  consistent with Grade I diastolic  dysfunction (impaired relaxation).   2. Right ventricular systolic function is normal. The right ventricular  size is normal.   3. The mitral valve is normal in structure. No evidence of mitral valve  regurgitation. No evidence of mitral stenosis.   4. The aortic valve is normal in structure. Aortic valve regurgitation is  not visualized. No aortic stenosis is present.   5.  The inferior vena cava is normal in size with greater than 50%  respiratory variability, suggesting right atrial pressure of 3 mmHg.   Echo 03/29/2021: -normal ejection fraction, no valvular issues  EKG:  EKG is personally reviewed. 03/27/2022:  Sinus rhythm. RBBB. LAFB. PVC. 12/19/2021:  sinus bradycardia 58 with right bundle branch block left anterior fascicular block, bifascicular block  Recent Labs: 03/18/2022: TSH 4.466 03/19/2022: ALT 17; Hemoglobin 15.3; Platelets 165 03/20/2022: BUN 15; Creatinine, Ser 1.06; Magnesium 2.1; Potassium 3.6; Sodium 139   Recent Lipid Panel No results found for: "CHOL", "TRIG", "HDL", "CHOLHDL", "VLDL", "LDLCALC", "LDLDIRECT"   Risk Assessment/Calculations:              Physical  Exam:    VS:  BP (!) 140/90 (BP Location: Left Arm, Patient Position: Sitting, Cuff Size: Normal)   Pulse 74   Ht '5\' 9"'$  (1.753 m)   Wt 164 lb (74.4 kg)   BMI 24.22 kg/m     Wt Readings from Last 3 Encounters:  03/27/22 164 lb (74.4 kg)  03/19/22 164 lb 14.5 oz (74.8 kg)  02/01/22 167 lb (75.8 kg)     GEN:  Well nourished, well developed in no acute distress HEENT: Normal NECK: No JVD; No carotid bruits LYMPHATICS: No lymphadenopathy CARDIAC: RRR with occasional skipped beats, no murmurs, no rubs, gallops RESPIRATORY:  Clear to auscultation without rales, wheezing or rhonchi  ABDOMEN: Soft, non-tender, non-distended MUSCULOSKELETAL:  No edema; No deformity  SKIN: Warm and dry NEUROLOGIC:  Alert and oriented x 3 PSYCHIATRIC:  Normal affect   ASSESSMENT:    1. Nonspecific abnormal electrocardiogram (ECG) (EKG)   2. PVC's (premature ventricular contractions)   3. Orthostatic hypotension     PLAN:    In order of problems listed above:  Orthostatic hypotension Significant orthostatic hypotension was noted from data above, blood pressure decreased from 180 down to 121.   I agree with decreasing the carvedilol to 3.125 mg twice a day.  His tamsulosin  was stopped transiently.  Okay to continue with amlodipine at 2.5 mg once a day.     We may have to be willing to tolerate higher blood pressures.  Explained labile blood pressures to them.  She would be several readings today.  Overall reasonable range.  Palpitations - I will check a ZIO monitor.  EKG today shows PVCs.  I want to exclude the possibility of atrial fibrillation.    Follow-up:  6 months.  Medication Adjustments/Labs and Tests Ordered: Current medicines are reviewed at length with the patient today.  Concerns regarding medicines are outlined above.   Orders Placed This Encounter  Procedures   LONG TERM MONITOR (3-14 DAYS)   No orders of the defined types were placed in this encounter.  Patient Instructions  Medication Instructions:  The current medical regimen is effective;  continue present plan and medications.  *If you need a refill on your cardiac medications before your next appointment, please call your pharmacy*  Testing/Procedures: [place] Monitor Instructions  Your physician has requested you wear a ZIO patch monitor for 14 days.  This is a single patch monitor. [employer] supplies one patch monitor per enrollment. Additional stickers are not available. Please do not apply patch if you will be having a Nuclear Stress Test,  Echocardiogram, Cardiac CT, MRI, or Chest Xray during the period you would be wearing the  monitor. The patch cannot be worn during these tests. You cannot remove and re-apply the  ZIO XT patch monitor.  Your ZIO patch monitor will be mailed 3 day USPS to your address on file. It may take 3-5 days  to receive your monitor after you have been enrolled.  Once you have received your monitor, please review the enclosed instructions. Your monitor  has already been registered assigning a specific monitor serial # to you.  Billing and Patient Assistance Program Information  We have supplied [employer] with any of your insurance  information on file for billing purposes. [employer] offers a sliding scale Patient Assistance Program for patients that do not have  insurance, or whose insurance does not completely cover the cost of the ZIO monitor.  You must apply for the Patient Assistance Program to  qualify for this discounted rate.  To apply, please call [employer] at [phone], select option 4, select option 2, ask to apply for  Patient Assistance Program. Theodore Demark will ask your household income, and how many people  are in your household. They will quote your out-of-pocket cost based on that information.  [employer] will also be able to set up a 83-month interest-free payment plan if needed.  Applying the monitor   Shave hair from upper left chest.  Hold abrader disc by orange tab. Rub abrader in 40 strokes over the upper left chest as  indicated in your monitor instructions.  Clean area with 4 enclosed alcohol pads. Let dry.  Apply patch as indicated in monitor instructions. Patch will be placed under collarbone on left  side of chest with arrow pointing upward.  Rub patch adhesive wings for 2 minutes. Remove white label marked "1". Remove the white  label marked "2". Rub patch adhesive wings for 2 additional minutes.  While looking in a mirror, press and release button in center of patch. A small green light will  flash 3-4 times. This will be your only indicator that the monitor has been turned on.  Do not shower for the first 24 hours. You may shower after the first 24 hours.  Press the button if you feel a symptom. You will hear a small click. Record Date, Time and  Symptom in the Patient Logbook.  When you are ready to remove the patch, follow instructions on the last 2 pages of Patient  Logbook. Stick patch monitor onto the last page of Patient Logbook.  Place Patient Logbook in the blue and white box. Use locking tab on box and tape box closed  securely. The blue and white box has prepaid postage on it. Please  place it in the mailbox as  soon as possible. Your physician should have your test results approximately 7 days after the  monitor [facility]  Call [facility]at [phone] you have questions regarding  your ZIO XT patch monitor. Call them immediately if you see an orange light blinking on your  monitor.  If your monitor falls off in less than 4 days, contact our Monitor department at [phone]  If your monitor becomes loose or falls off after 4 days call [employer] at [phone]  suggestions on securing your monitor    Follow-Up: [facility] you and your health needs are our priority.  As part of our continuing mission to provide you with exceptional heart care, we have created designated Provider Care Teams.  These Care Teams include your primary Cardiologist (physician) and Advanced Practice Providers (APPs -  Physician Assistants and Nurse Practitioners) who all work together to provide you with the care you need, when you need it.  We recommend signing up for the patient portal called "MyChart".  Sign up information is provided on this After Visit Summary.  MyChart is used to connect with patients for Virtual Visits (Telemedicine).  Patients are able to view lab/test results, encounter notes, upcoming appointments, etc.  Non-urgent messages can be sent to your provider as well.   To learn more about what you can do with MyChart, go to [email or web address]    Your next appointment:   6 month(s)  The format for your next appointment:   In Person  Provider:   MCandee Furbish MD     Important Information About Sugar  I,Mathew Stumpf,acting as a Education administrator for [employer], MD.,have documented all relevant documentation on the behalf of Candee Furbish, MD,as directed by  Candee Furbish, MD while in the presence of Candee Furbish, MD.  I, Candee Furbish, MD, have reviewed all documentation for this visit. The  documentation on 03/27/22 for the exam, diagnosis, procedures, and orders are all accurate and complete.   Signed, Candee Furbish, MD  03/27/2022 3:51 PM    [place]

## 2022-03-28 NOTE — Addendum Note (Signed)
Addended by: Drue Novel I on: 03/28/2022 07:26 PM   Modules accepted: Orders

## 2022-04-05 DIAGNOSIS — I493 Ventricular premature depolarization: Secondary | ICD-10-CM | POA: Diagnosis not present

## 2022-04-06 NOTE — Addendum Note (Signed)
Addended by: Maren Beach, Kiah Vanalstine A on: 04/06/2022 09:02 AM   Modules accepted: Orders

## 2022-04-18 ENCOUNTER — Ambulatory Visit: Payer: Medicare Other | Admitting: Nurse Practitioner

## 2022-04-25 DIAGNOSIS — I493 Ventricular premature depolarization: Secondary | ICD-10-CM | POA: Diagnosis not present

## 2022-05-03 DIAGNOSIS — J449 Chronic obstructive pulmonary disease, unspecified: Secondary | ICD-10-CM | POA: Diagnosis not present

## 2022-05-03 DIAGNOSIS — I130 Hypertensive heart and chronic kidney disease with heart failure and stage 1 through stage 4 chronic kidney disease, or unspecified chronic kidney disease: Secondary | ICD-10-CM | POA: Diagnosis not present

## 2022-05-03 DIAGNOSIS — N1831 Chronic kidney disease, stage 3a: Secondary | ICD-10-CM | POA: Diagnosis not present

## 2022-05-03 DIAGNOSIS — E7800 Pure hypercholesterolemia, unspecified: Secondary | ICD-10-CM | POA: Diagnosis not present

## 2022-05-09 DIAGNOSIS — M810 Age-related osteoporosis without current pathological fracture: Secondary | ICD-10-CM | POA: Diagnosis not present

## 2022-05-09 DIAGNOSIS — M353 Polymyalgia rheumatica: Secondary | ICD-10-CM | POA: Diagnosis not present

## 2022-05-09 DIAGNOSIS — Z7952 Long term (current) use of systemic steroids: Secondary | ICD-10-CM | POA: Diagnosis not present

## 2022-05-09 DIAGNOSIS — M79645 Pain in left finger(s): Secondary | ICD-10-CM | POA: Diagnosis not present

## 2022-05-09 DIAGNOSIS — M1990 Unspecified osteoarthritis, unspecified site: Secondary | ICD-10-CM | POA: Diagnosis not present

## 2022-05-09 DIAGNOSIS — M79643 Pain in unspecified hand: Secondary | ICD-10-CM | POA: Diagnosis not present

## 2022-05-09 DIAGNOSIS — M6530 Trigger finger, unspecified finger: Secondary | ICD-10-CM | POA: Diagnosis not present

## 2022-05-17 ENCOUNTER — Other Ambulatory Visit (HOSPITAL_BASED_OUTPATIENT_CLINIC_OR_DEPARTMENT_OTHER): Payer: Self-pay

## 2022-06-29 DIAGNOSIS — M810 Age-related osteoporosis without current pathological fracture: Secondary | ICD-10-CM | POA: Diagnosis not present

## 2022-06-29 DIAGNOSIS — M353 Polymyalgia rheumatica: Secondary | ICD-10-CM | POA: Diagnosis not present

## 2022-06-29 DIAGNOSIS — M6530 Trigger finger, unspecified finger: Secondary | ICD-10-CM | POA: Diagnosis not present

## 2022-06-29 DIAGNOSIS — M79643 Pain in unspecified hand: Secondary | ICD-10-CM | POA: Diagnosis not present

## 2022-06-29 DIAGNOSIS — Z7952 Long term (current) use of systemic steroids: Secondary | ICD-10-CM | POA: Diagnosis not present

## 2022-06-29 DIAGNOSIS — M1990 Unspecified osteoarthritis, unspecified site: Secondary | ICD-10-CM | POA: Diagnosis not present

## 2022-07-07 ENCOUNTER — Telehealth: Payer: Self-pay | Admitting: Cardiology

## 2022-07-07 NOTE — Telephone Encounter (Signed)
Polly from pt's PCP called in today stating she called pt today and he reports feeling an irregular hr especially when he lays down at night. She states he doesn't have any CP, but its worrying him and its making him tired/weak. She would like for someone to f/u with the pt

## 2022-07-07 NOTE — Telephone Encounter (Signed)
Spoke with pt who is reporting his heartbeat feels irregular.  He states it usually starts in the afternoon but today it began in the morning.  It causes him to feel week in the chest per his report.  This am HR 77 BP 150/75-85.  His normal HR is between 75-85 bpm.  He reports getting dizzy a lot and not having good balance.  He denies any chest pain/tightness but does report shortness of breath with activity.  He has a HX: of asthma but has not been using his inhalers.  C/O having "allergies" but has not been taking his allergy med.  He is using OTC flonase.   Pt also report when he checks his pulse it may beat regular for 3 or 4 beats then skip around.  Advised to avoid and type of stimulants including caffeine, over the counter cold medication and alcohol.  Schedule with Dr Marlou Porch for further evaluation on 2/1.  Pt will call back prior to then if s/s worsen.  If he develops chest pain or worsening SOB, he is aware to report to the ED. Will forward to Dr Marlou Porch for his knowledge.

## 2022-07-10 NOTE — Telephone Encounter (Signed)
  Agree. Recent ZIO reviewed Candee Furbish, MD

## 2022-07-13 ENCOUNTER — Encounter: Payer: Self-pay | Admitting: Cardiology

## 2022-07-13 ENCOUNTER — Ambulatory Visit: Payer: Medicare Other | Attending: Cardiology | Admitting: Cardiology

## 2022-07-13 VITALS — BP 130/78 | HR 87 | Ht 69.0 in | Wt 170.0 lb

## 2022-07-13 DIAGNOSIS — I10 Essential (primary) hypertension: Secondary | ICD-10-CM | POA: Diagnosis not present

## 2022-07-13 DIAGNOSIS — I493 Ventricular premature depolarization: Secondary | ICD-10-CM | POA: Diagnosis not present

## 2022-07-13 DIAGNOSIS — I951 Orthostatic hypotension: Secondary | ICD-10-CM

## 2022-07-13 MED ORDER — METOPROLOL TARTRATE 25 MG PO TABS: 12.5000 mg | ORAL_TABLET | Freq: Two times a day (BID) | ORAL | 3 refills | Status: DC

## 2022-07-13 NOTE — Patient Instructions (Signed)
Medication Instructions:  Please discontinue your Losartan.  Start Metoprolol Tartrate 25 mg 1/2 tablet twice a day. Continue all other medications as listed.  *If you need a refill on your cardiac medications before your next appointment, please call your pharmacy*  Follow-Up: At [facility], you and your health needs are our priority.  As part of our continuing mission to provide you with exceptional heart care, we have created designated Provider Care Teams.  These Care Teams include your primary Cardiologist (physician) and Advanced Practice Providers (APPs -  Physician Assistants and Nurse Practitioners) who all work together to provide you with the care you need, when you need it.  We recommend signing up for the patient portal called "MyChart".  Sign up information is provided on this After Visit Summary.  MyChart is used to connect with patients for Virtual Visits (Telemedicine).  Patients are able to view lab/test results, encounter notes, upcoming appointments, etc.  Non-urgent messages can be sent to your provider as well.   To learn more about what you can do with MyChart, go to [email or web address].    Your next appointment:   3 month(s)  Provider:   Nicholes Rough, PA-C, Ambrose Pancoast, NP, Ermalinda Barrios, PA-C, Christen Bame, NP, or Richardson Dopp, PA-C

## 2022-07-13 NOTE — Progress Notes (Signed)
Cardiology Office Note:    Date:  07/13/2022   ID:  [name], DOB 04-24-1933, MRN [id number]  PCP:  Lance Contras, MD   [facility] Providers Cardiologist:  Lance Furbish, MD     Referring MD: Lance Contras, MD    History of Present Illness:    [name] is a 89 y.o. male here for the follow-up palpitations.  Feels the skips afternoon, weird type feeling.  Makes him nervous.  Sometimes he feels like he needs to just sit down.  ZIO monitor as below reviewed from late 2023.  Previously here for the evaluation of orthostatic hypotension/vertigo at the request of Dr. Antony Lewis.  Apparently according to prior outside office note HPI he was having dizziness when he laid down/got up.  Walks down the hallway has to hold onto walls sometimes.  Clearly has 2 separate processes, 1 is a vertiginous-like symptom and one is a decrease in blood pressure when standing.  They did orthostatics and he was 180/89 laying, 168/85 sitting, 121/67 standing.  Here his orthostatics were not quite as dramatic at 170 laying, 180 sitting, 170 standing and then 160 standing for 3 minutes.  This is while he has been off of the amlodipine as well as Flomax and decreased carvedilol.  Has a history of polymyalgia rheumatica, COPD.  His carvedilol was decreased to 3.125 mg twice a day.  Tamsulosin was stopped.  He was referred to ENT for recurrent vertigo.  Creatinine was normal CBC normal LDL 98.  Lately he has been feeling "kind of weak" with low energy levels.  Additionally he complains of noticeable skipped beats, especially while lying down at night.  He denies any chest pain, shortness of breath, or peripheral edema. No lightheadedness, headaches, syncope, orthopnea, or PND.    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)    Hypocalcemia    Memory difficulty 05/15/2017   Memory loss    Orthostatic dizziness    Polymyalgia rheumatica (HCC)    Prostatic hyperplasia    Restless leg syndrome    Skin cancer, basal cell    Sleep apnea    Transient global amnesia     Past Surgical History:  Procedure Laterality Date   [place] ARTHROPLASTY Right 05/13/2021   Procedure: REVERSE SHOULDER ARTHROPLASTY;  Surgeon: [name], MD;  Location: [facility];  Service: Orthopedics;  Laterality: Right;  with ISB   SHOULDER SURGERY Left     Current Medications: Current Meds  Medication Sig   acetaminophen (TYLENOL) 500 MG tablet Take 500-1,000 mg by mouth every 6 (six) hours as needed (for pain).   albuterol (VENTOLIN HFA) 108 (90 Base) MCG/ACT inhaler Inhale 1-2 puffs into the lungs every 6 (six) hours as needed for wheezing or shortness of breath.   budesonide (PULMICORT) 0.5 MG/2ML nebulizer solution Take 0.5 mg by nebulization 2 (two) times daily as needed (wheezing/shortness of breath).   metoprolol tartrate (LOPRESSOR) 25 MG tablet Take 0.5 tablets (12.5 mg total) by mouth 2 (two) times daily.   pravastatin (PRAVACHOL) 40 MG tablet Take 40 mg by  mouth in the morning.   rOPINIRole (REQUIP) 0.5 MG tablet Take 0.5 mg by mouth at bedtime as needed (restless legs).   tamsulosin (FLOMAX) 0.4 MG CAPS capsule Take 0.4 mg by mouth daily as needed (prostate health).   [DISCONTINUED] losartan (COZAAR) 25 MG tablet Take 1 tablet (25 mg total) by mouth daily.     Allergies:   Codeine and Penicillins   Social History   Socioeconomic History   Marital status: Married    Spouse name: Not on file   Number of children: 2   Years of education: 12   Highest education level: Not on file  Occupational History   Not on file  Tobacco Use   Smoking status: Former    Types:  Cigarettes    Quit date: 07/10/1978    Years since quitting: 44.0   Smokeless tobacco: Never  Vaping Use   Vaping Use: Never used  Substance and Sexual Activity   Alcohol use: No   Drug use: No   Sexual activity: Never  Other Topics Concern   Not on file  Social History Narrative      Caffeine use: coffee/coke daily   Right handed    Social Determinants of Health   Financial Resource Strain: Not on file  Food Insecurity: Not on file  Transportation Needs: Not on file  Physical Activity: Not on file  Stress: Not on file  Social Connections: Not on file     Family History: The patient's family history includes CVA in his mother; Cancer - Lung in his brother and brother; Cancer - Prostate in his father; Hypertension in his mother.  ROS:   Please see the history of present illness.    (+) Generalized weakness (+) Fatigue (+) Palpitations All other systems reviewed and are negative.  EKGs/Labs/Other Studies Reviewed:    The following studies were reviewed today:  Echo  03/19/2022: Sonographer Comments: Technically difficult study due to poor echo  windows. Image acquisition challenging due to respiratory motion.   IMPRESSIONS   1. Left ventricular ejection fraction, by estimation, is 60 to 65%. The  left ventricle has normal function. The left ventricle has no regional  wall motion abnormalities. Left ventricular diastolic parameters are  consistent with Grade I diastolic  dysfunction (impaired relaxation).   2. Right ventricular systolic function is normal. The right ventricular  size is normal.   3. The mitral valve is normal in structure. No evidence of mitral valve  regurgitation. No evidence of mitral stenosis.   4. The aortic valve is normal in structure. Aortic valve regurgitation is  not visualized. No aortic stenosis is present.   5. The inferior vena cava is normal in size with greater than 50%  respiratory variability, suggesting right atrial pressure of  3 mmHg.   Echo 03/29/2021: -normal ejection fraction, no valvular issues  EKG:  EKG is personally reviewed. 07/13/2022-sinus rhythm 75 right bundle branch block left anterior fascicular block, bifascicular block 03/27/2022:  Sinus rhythm. RBBB. LAFB. PVC. 12/19/2021:  sinus bradycardia 58 with right bundle branch block left anterior fascicular block, bifascicular block  Recent Labs: 03/18/2022: TSH 4.466 03/19/2022: ALT 17; Hemoglobin 15.3; Platelets 165 03/20/2022: BUN 15; Creatinine, Ser 1.06; Magnesium 2.1; Potassium 3.6; Sodium 139   Recent Lipid Panel No results found for: "CHOL", "TRIG", "HDL", "CHOLHDL", "VLDL", "LDLCALC", "LDLDIRECT"   Risk Assessment/Calculations:              Physical Exam:    VS:  BP 130/78   Pulse  87   Ht '5\' 9"'$  (1.753 m)   Wt 170 lb (77.1 kg)   SpO2 98%   BMI 25.10 kg/m     Wt Readings from Last 3 Encounters:  07/13/22 170 lb (77.1 kg)  03/27/22 164 lb (74.4 kg)  03/19/22 164 lb 14.5 oz (74.8 kg)     GEN:  Well nourished, well developed in no acute distress HEENT: Normal NECK: No JVD; No carotid bruits LYMPHATICS: No lymphadenopathy CARDIAC: RRR with occasional skipped beats, no murmurs, no rubs, gallops RESPIRATORY:  Clear to auscultation without rales, wheezing or rhonchi  ABDOMEN: Soft, non-tender, non-distended MUSCULOSKELETAL:  No edema; No deformity  SKIN: Warm and dry NEUROLOGIC:  Alert and oriented x 3 PSYCHIATRIC:  Normal affect   ASSESSMENT:    1. PVC's (premature ventricular contractions)   2. Essential hypertension   3. Orthostatic hypotension      PLAN:    In order of problems listed above:  Orthostatic hypotension Significant orthostatic hypotension was noted from data above, blood pressure decreased from 180 down to 121.    We may have to be willing to tolerate higher blood pressures.  Explained labile blood pressures to them.  She would be several readings today.  Overall reasonable range.  Palpitations    Sinus rhythm with average heart rate of 66 bpm   Brief runs of atrial tachycardia   Frequent premature atrial contractions   Rare PVCs   No evidence of atrial fibrillation - Reassuring   Symptoms of skipped beats may be associated with premature atrial contractions-benign.   Will go ahead and discontinue his losartan 25 mg.  I am willing to tolerate a higher blood pressure because of his orthostasis.  We will try metoprolol 12.5 mg twice a day.  If he continues to have a lot of skipping, we can increase the metoprolol.     Follow-up:  3 months.  Medication Adjustments/Labs and Tests Ordered: Current medicines are reviewed at length with the patient today.  Concerns regarding medicines are outlined above.   Orders Placed This Encounter  Procedures   EKG 12-Lead   Meds ordered this encounter  Medications   metoprolol tartrate (LOPRESSOR) 25 MG tablet    Sig: Take 0.5 tablets (12.5 mg total) by mouth 2 (two) times daily.    Dispense:  90 tablet    Refill:  3   Patient Instructions  Medication Instructions:  Please discontinue your Losartan.  Start Metoprolol Tartrate 25 mg 1/2 tablet twice a day. Continue all other medications as listed.  *If you need a refill on your cardiac medications before your next appointment, please call your pharmacy*  Follow-Up: At [facility], you and your health needs are our priority.  As part of our continuing mission to provide you with exceptional heart care, we have created designated Provider Care Teams.  These Care Teams include your primary Cardiologist (physician) and Advanced Practice Providers (APPs -  Physician Assistants and Nurse Practitioners) who all work together to provide you with the care you need, when you need it.  We recommend signing up for the patient portal called "MyChart".  Sign up information is provided on this After Visit Summary.  MyChart is used to connect with patients for Virtual Visits (Telemedicine).   Patients are able to view lab/test results, encounter notes, upcoming appointments, etc.  Non-urgent messages can be sent to your provider as well.   To learn more about what you can do with MyChart, go to [email or web address].  Your next appointment:   3 month(s)  Provider:   Nicholes Rough, PA-C, Ambrose Pancoast, NP, Ermalinda Barrios, PA-C, Christen Bame, NP, or Richardson Dopp, PA-C              Signed, Lance Furbish, MD  07/13/2022 4:04 PM    [place]

## 2022-07-26 ENCOUNTER — Other Ambulatory Visit: Payer: Self-pay

## 2022-07-26 ENCOUNTER — Inpatient Hospital Stay (HOSPITAL_COMMUNITY)
Admission: EM | Admit: 2022-07-26 | Discharge: 2022-07-28 | DRG: 179 | Disposition: A | Discharge status: Home Health | Payer: Medicare Other | Attending: Internal Medicine | Admitting: Internal Medicine

## 2022-07-26 ENCOUNTER — Observation Stay (HOSPITAL_COMMUNITY): Payer: Medicare Other

## 2022-07-26 ENCOUNTER — Emergency Department (HOSPITAL_COMMUNITY): Payer: Medicare Other

## 2022-07-26 DIAGNOSIS — Z8249 Family history of ischemic heart disease and other diseases of the circulatory system: Secondary | ICD-10-CM

## 2022-07-26 DIAGNOSIS — E785 Hyperlipidemia, unspecified: Secondary | ICD-10-CM | POA: Diagnosis not present

## 2022-07-26 DIAGNOSIS — Z96611 Presence of right artificial shoulder joint: Secondary | ICD-10-CM | POA: Diagnosis present

## 2022-07-26 DIAGNOSIS — J438 Other emphysema: Secondary | ICD-10-CM | POA: Diagnosis present

## 2022-07-26 DIAGNOSIS — R001 Bradycardia, unspecified: Secondary | ICD-10-CM | POA: Diagnosis present

## 2022-07-26 DIAGNOSIS — Z880 Allergy status to penicillin: Secondary | ICD-10-CM

## 2022-07-26 DIAGNOSIS — U071 COVID-19: Principal | ICD-10-CM | POA: Diagnosis present

## 2022-07-26 DIAGNOSIS — I951 Orthostatic hypotension: Secondary | ICD-10-CM | POA: Diagnosis present

## 2022-07-26 DIAGNOSIS — W1830XA Fall on same level, unspecified, initial encounter: Secondary | ICD-10-CM | POA: Diagnosis present

## 2022-07-26 DIAGNOSIS — Z789 Other specified health status: Secondary | ICD-10-CM | POA: Diagnosis not present

## 2022-07-26 DIAGNOSIS — J42 Unspecified chronic bronchitis: Secondary | ICD-10-CM | POA: Diagnosis present

## 2022-07-26 DIAGNOSIS — Z823 Family history of stroke: Secondary | ICD-10-CM | POA: Diagnosis not present

## 2022-07-26 DIAGNOSIS — D649 Anemia, unspecified: Secondary | ICD-10-CM | POA: Diagnosis present

## 2022-07-26 DIAGNOSIS — I10 Essential (primary) hypertension: Secondary | ICD-10-CM | POA: Diagnosis not present

## 2022-07-26 DIAGNOSIS — Z7951 Long term (current) use of inhaled steroids: Secondary | ICD-10-CM | POA: Diagnosis not present

## 2022-07-26 DIAGNOSIS — Z85828 Personal history of other malignant neoplasm of skin: Secondary | ICD-10-CM

## 2022-07-26 DIAGNOSIS — G2581 Restless legs syndrome: Secondary | ICD-10-CM | POA: Diagnosis not present

## 2022-07-26 DIAGNOSIS — H8110 Benign paroxysmal vertigo, unspecified ear: Secondary | ICD-10-CM | POA: Diagnosis present

## 2022-07-26 DIAGNOSIS — Y92009 Unspecified place in unspecified non-institutional (private) residence as the place of occurrence of the external cause: Secondary | ICD-10-CM | POA: Diagnosis not present

## 2022-07-26 DIAGNOSIS — R911 Solitary pulmonary nodule: Secondary | ICD-10-CM | POA: Diagnosis not present

## 2022-07-26 DIAGNOSIS — Z87891 Personal history of nicotine dependence: Secondary | ICD-10-CM

## 2022-07-26 DIAGNOSIS — Z79899 Other long term (current) drug therapy: Secondary | ICD-10-CM | POA: Diagnosis not present

## 2022-07-26 DIAGNOSIS — Z043 Encounter for examination and observation following other accident: Secondary | ICD-10-CM | POA: Diagnosis not present

## 2022-07-26 DIAGNOSIS — N401 Enlarged prostate with lower urinary tract symptoms: Secondary | ICD-10-CM | POA: Diagnosis not present

## 2022-07-26 DIAGNOSIS — S0003XA Contusion of scalp, initial encounter: Secondary | ICD-10-CM | POA: Diagnosis not present

## 2022-07-26 DIAGNOSIS — K219 Gastro-esophageal reflux disease without esophagitis: Secondary | ICD-10-CM | POA: Diagnosis present

## 2022-07-26 DIAGNOSIS — W19XXXA Unspecified fall, initial encounter: Secondary | ICD-10-CM | POA: Diagnosis not present

## 2022-07-26 DIAGNOSIS — R55 Syncope and collapse: Secondary | ICD-10-CM

## 2022-07-26 DIAGNOSIS — Z885 Allergy status to narcotic agent status: Secondary | ICD-10-CM | POA: Diagnosis not present

## 2022-07-26 DIAGNOSIS — M353 Polymyalgia rheumatica: Secondary | ICD-10-CM | POA: Diagnosis present

## 2022-07-26 DIAGNOSIS — J4489 Other specified chronic obstructive pulmonary disease: Secondary | ICD-10-CM | POA: Diagnosis present

## 2022-07-26 DIAGNOSIS — R531 Weakness: Secondary | ICD-10-CM | POA: Diagnosis not present

## 2022-07-26 LAB — CBG MONITORING, ED: Glucose-Capillary: 113 mg/dL — ABNORMAL HIGH (ref 70–99)

## 2022-07-26 LAB — CBC
HCT: 39.7 % (ref 39.0–52.0)
Hemoglobin: 13.7 g/dL (ref 13.0–17.0)
MCH: 33 pg (ref 26.0–34.0)
MCHC: 34.5 g/dL (ref 30.0–36.0)
MCV: 95.7 fL (ref 80.0–100.0)
Platelets: 136 10*3/uL — ABNORMAL LOW (ref 150–400)
RBC: 4.15 MIL/uL — ABNORMAL LOW (ref 4.22–5.81)
RDW: 13.2 % (ref 11.5–15.5)
WBC: 7.8 10*3/uL (ref 4.0–10.5)
nRBC: 0 % (ref 0.0–0.2)

## 2022-07-26 LAB — COMPREHENSIVE METABOLIC PANEL
ALT: 14 U/L (ref 0–44)
AST: 26 U/L (ref 15–41)
Albumin: 3.8 g/dL (ref 3.5–5.0)
Alkaline Phosphatase: 60 U/L (ref 38–126)
Anion gap: 9 (ref 5–15)
BUN: 14 mg/dL (ref 8–23)
CO2: 26 mmol/L (ref 22–32)
Calcium: 8.7 mg/dL — ABNORMAL LOW (ref 8.9–10.3)
Chloride: 102 mmol/L (ref 98–111)
Creatinine, Ser: 1.07 mg/dL (ref 0.61–1.24)
GFR, Estimated: >60 mL/min (ref >60)
Glucose, Bld: 106 mg/dL — ABNORMAL HIGH (ref 70–99)
Potassium: 3.8 mmol/L (ref 3.5–5.1)
Sodium: 137 mmol/L (ref 135–145)
Total Bilirubin: 1.4 mg/dL — ABNORMAL HIGH (ref 0.3–1.2)
Total Protein: 6.7 g/dL (ref 6.5–8.1)

## 2022-07-26 LAB — RESP PANEL BY RT-PCR (RSV, FLU A&B, COVID)  RVPGX2
Influenza A by PCR: NEGATIVE
Influenza B by PCR: NEGATIVE
Resp Syncytial Virus by PCR: NEGATIVE
SARS Coronavirus 2 by RT PCR: POSITIVE — AB

## 2022-07-26 MED ORDER — SODIUM CHLORIDE 0.9% FLUSH
3.0000 mL | Freq: Two times a day (BID) | INTRAVENOUS | Status: DC
  Administered 2022-07-26 – 2022-07-27 (×2): 3 mL via INTRAVENOUS

## 2022-07-26 MED ORDER — SODIUM CHLORIDE 0.9 % IV SOLN: INTRAVENOUS | Status: AC

## 2022-07-26 MED ORDER — METOPROLOL TARTRATE 12.5 MG HALF TABLET
12.5000 mg | ORAL_TABLET | Freq: Two times a day (BID) | ORAL | Status: DC
  Administered 2022-07-26 – 2022-07-28 (×4): 12.5 mg via ORAL
  Filled 2022-07-26 (×4): qty 1

## 2022-07-26 MED ORDER — PRAVASTATIN SODIUM 40 MG PO TABS
40.0000 mg | ORAL_TABLET | Freq: Every morning | ORAL | Status: DC
  Administered 2022-07-27 – 2022-07-28 (×2): 40 mg via ORAL
  Filled 2022-07-26 (×2): qty 1

## 2022-07-26 MED ORDER — ENOXAPARIN SODIUM 40 MG/0.4ML IJ SOSY
40.0000 mg | PREFILLED_SYRINGE | INTRAMUSCULAR | Status: DC
  Administered 2022-07-26 – 2022-07-27 (×2): 40 mg via SUBCUTANEOUS
  Filled 2022-07-26 (×2): qty 0.4

## 2022-07-26 MED ORDER — ALBUTEROL SULFATE (2.5 MG/3ML) 0.083% IN NEBU: 2.5000 mg | INHALATION_SOLUTION | Freq: Four times a day (QID) | RESPIRATORY_TRACT | Status: DC | PRN

## 2022-07-26 MED ORDER — ALBUTEROL SULFATE HFA 108 (90 BASE) MCG/ACT IN AERS: 1.0000 | INHALATION_SPRAY | Freq: Four times a day (QID) | RESPIRATORY_TRACT | Status: DC | PRN

## 2022-07-26 MED ORDER — KETOROLAC TROMETHAMINE 30 MG/ML IJ SOLN
30.0000 mg | Freq: Once | INTRAMUSCULAR | Status: AC
  Administered 2022-07-26: 30 mg via INTRAVENOUS
  Filled 2022-07-26: qty 1

## 2022-07-26 MED ORDER — ACETAMINOPHEN 325 MG PO TABS: 650.0000 mg | ORAL_TABLET | Freq: Four times a day (QID) | ORAL | Status: DC | PRN

## 2022-07-26 MED ORDER — DIPHENHYDRAMINE HCL 50 MG/ML IJ SOLN
12.5000 mg | Freq: Once | INTRAMUSCULAR | Status: AC
  Administered 2022-07-26: 12.5 mg via INTRAVENOUS
  Filled 2022-07-26: qty 1

## 2022-07-26 MED ORDER — ROPINIROLE HCL 0.5 MG PO TABS: 0.5000 mg | ORAL_TABLET | Freq: Every evening | ORAL | Status: DC | PRN

## 2022-07-26 MED ORDER — POLYETHYLENE GLYCOL 3350 17 G PO PACK: 17.0000 g | PACK | Freq: Every day | ORAL | Status: DC | PRN

## 2022-07-26 MED ORDER — NIRMATRELVIR/RITONAVIR (PAXLOVID)TABLET
3.0000 | ORAL_TABLET | Freq: Two times a day (BID) | ORAL | Status: DC
  Administered 2022-07-26 – 2022-07-28 (×4): 3 via ORAL
  Filled 2022-07-26: qty 30

## 2022-07-26 MED ORDER — ACETAMINOPHEN 650 MG RE SUPP: 650.0000 mg | Freq: Four times a day (QID) | RECTAL | Status: DC | PRN

## 2022-07-26 MED ORDER — PROCHLORPERAZINE EDISYLATE 10 MG/2ML IJ SOLN
5.0000 mg | Freq: Once | INTRAMUSCULAR | Status: AC
  Administered 2022-07-26: 5 mg via INTRAVENOUS
  Filled 2022-07-26: qty 2

## 2022-07-26 MED ORDER — BUDESONIDE 0.5 MG/2ML IN SUSP: 0.5000 mg | Freq: Two times a day (BID) | RESPIRATORY_TRACT | Status: DC | PRN

## 2022-07-26 NOTE — ED Notes (Signed)
ED TO INPATIENT HANDOFF REPORT  ED Nurse Name and Phone #: Altha Harm [phone]  S Name/Age/Gender [name] 89 y.o. male Room/Bed: 017C/017C  Code Status   Code Status: Full Code  Home/SNF/Other Home Patient oriented to: self, place, time, and situation Is this baseline? Yes   Triage Complete: Triage complete  Chief Complaint Syncope and collapse [R55]  Triage Note Pt. Stated, I got up and got a little light headed and fell on hard wood floor.  Pt with laceration above left eye and today was weak with fever of 100 and have a fever .  Pt tested positive for COVID   Allergies Allergies  Allergen Reactions   Codeine Nausea And Vomiting   Penicillins Other (See Comments)    Pt does not remember reaction    Level of Care/Admitting Diagnosis ED Disposition     ED Disposition  Admit   Condition  --   Comment  Hospital Area: [facility] [[id number]]  Level of Care: Telemetry Medical [104]  May place patient in observation at [facility] or [facility] if equivalent level of care is available:: No  Covid Evaluation: Confirmed COVID Positive  Diagnosis: Syncope and collapse [780.2.ICD-9-CM]  Admitting Physician: Marcelyn Bruins [id number]  Attending Physician: Marcelyn Bruins [id number]          B Medical/Surgery History Past Medical History:  Diagnosis Date   Acne rosacea    Arthritis    Asthma    COPD (chronic obstructive pulmonary disease) ([place])    Dyspnea    GERD without esophagitis    Hx of pancreatitis    Hyperlipidemia    Hypertension    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)    Hypocalcemia    Memory difficulty 05/15/2017   Memory loss    Orthostatic dizziness    Polymyalgia rheumatica (HCC)    Prostatic hyperplasia    Restless leg syndrome    Skin cancer, basal cell    Sleep apnea    Transient global amnesia    Past Surgical History:  Procedure Laterality Date   [place] ARTHROPLASTY Right 05/13/2021   Procedure: REVERSE SHOULDER ARTHROPLASTY;  Surgeon: [name], MD;  Location: [facility];  Service: Orthopedics;  Laterality: Right;  with ISB   SHOULDER SURGERY Left      A IV Location/Drains/Wounds Patient Lines/Drains/Airways Status     Active Line/Drains/Airways     Name Placement date Placement time Site Days   Peripheral IV 07/26/22 22 G Posterior;Right Wrist 07/26/22  1140  Wrist  less than 1   Incision (Closed) 05/13/21 Shoulder Right 05/13/21  0852  -- 439   Wound / Incision (Open or Dehisced) 07/26/22 Laceration Face Left;Upper 1in laceration above L eye 07/26/22  1130  Face  less than 1            Intake/Output Last 24 hours No intake or output data in the 24 hours ending 07/26/22 1741  Labs/Imaging Results for orders placed or performed during the hospital encounter of 07/26/22 (from the past 48 hour(s))  CBC     Status: Abnormal   Collection Time: 07/26/22 10:44 AM  Result Value Ref Range   WBC 7.8 4.0 - 10.5 K/uL   RBC 4.15 (L) 4.22 - 5.81 MIL/uL   Hemoglobin 13.7 13.0 - 17.0 g/dL   HCT 39.7 39.0 - 52.0 %   MCV 95.7  80.0 - 100.0 fL   MCH 33.0 26.0 - 34.0 pg   MCHC 34.5 30.0 - 36.0 g/dL   RDW 13.2 11.5 - 15.5 %   Platelets 136 (L) 150 - 400 K/uL    Comment: REPEATED TO VERIFY   nRBC 0.0 0.0 - 0.2 %    Comment: Performed at [facility], [place] [address] [place], [place] [address]  CBG monitoring, ED     Status: Abnormal   Collection Time: 07/26/22 11:26 AM  Result Value Ref Range   Glucose-Capillary 113 (H) 70 - 99 mg/dL    Comment: Glucose reference range applies only to samples taken after fasting for at least 8 hours.  Comprehensive metabolic panel     Status: Abnormal   Collection Time: 07/26/22  1:29 PM  Result Value Ref Range   Sodium 137 135 - 145 mmol/L   Potassium 3.8 3.5 - 5.1 mmol/L   Chloride 102 98 - 111 mmol/L   CO2 26 22 - 32 mmol/L   Glucose, Bld 106 (H)  70 - 99 mg/dL    Comment: Glucose reference range applies only to samples taken after fasting for at least 8 hours.   BUN 14 8 - 23 mg/dL   Creatinine, Ser 1.07 0.61 - 1.24 mg/dL   Calcium 8.7 (L) 8.9 - 10.3 mg/dL   Total Protein 6.7 6.5 - 8.1 g/dL   Albumin 3.8 3.5 - 5.0 g/dL   AST 26 15 - 41 U/L   ALT 14 0 - 44 U/L   Alkaline Phosphatase 60 38 - 126 U/L   Total Bilirubin 1.4 (H) 0.3 - 1.2 mg/dL   GFR, Estimated >60 >60 mL/min    Comment: (NOTE) Calculated using the CKD-EPI Creatinine Equation (2021)    Anion gap 9 5 - 15    Comment: Performed at [facility] [address] [place], [place] [address]  Resp panel by RT-PCR (RSV, Flu A&B, Covid) Anterior Nasal Swab     Status: Abnormal   Collection Time: 07/26/22  3:18 PM   Specimen: Anterior Nasal Swab  Result Value Ref Range   SARS Coronavirus 2 by RT PCR POSITIVE (A) NEGATIVE   Influenza A by PCR NEGATIVE NEGATIVE   Influenza B by PCR NEGATIVE NEGATIVE    Comment: (NOTE) The Xpert Xpress SARS-CoV-2/FLU/RSV plus assay is intended as an aid in the diagnosis of influenza from Nasopharyngeal swab specimens and should not be used as a sole basis for treatment. Nasal washings and aspirates are unacceptable for Xpert Xpress SARS-CoV-2/FLU/RSV testing.  Fact Sheet for Patients: EntrepreneurPulse.com.au  Fact Sheet for Healthcare Providers: IncredibleEmployment.be  This test is not yet approved or cleared by the [place] FDA and has been authorized for detection and/or diagnosis of SARS-CoV-2 by FDA under an Emergency Use Authorization (EUA). This EUA will remain in effect (meaning this test can be used) for the duration of the COVID-19 declaration under Section 564(b)(1) of the Act, 21 U.S.C. section 360bbb-3(b)(1), unless the authorization is terminated or revoked.     Resp Syncytial Virus by PCR NEGATIVE NEGATIVE    Comment: (NOTE) Fact Sheet for  Patients: EntrepreneurPulse.com.au  Fact Sheet for Healthcare Providers: IncredibleEmployment.be  This test is not yet approved or cleared by the [place] FDA and has been authorized for detection and/or diagnosis of SARS-CoV-2 by FDA under an Emergency Use Authorization (EUA). This EUA will remain in effect (meaning this test can be used) for the duration of the COVID-19 declaration under  Section 564(b)(1) of the Act, 21 U.S.C. section 360bbb-3(b)(1), unless the authorization is terminated or revoked.  Performed at [facility], [place] [address], [place], [place] [address]    DG Chest Portable 1 View  Result Date: 07/26/2022 CLINICAL DATA:  COVID EXAM: PORTABLE CHEST 1 VIEW COMPARISON:  None Available. FINDINGS: Normal mediastinum and cardiac silhouette. Normal pulmonary vasculature. No evidence of effusion, infiltrate, or pneumothorax. No acute bony abnormality. RIGHT shoulder arthroplasty. IMPRESSION: No acute cardiopulmonary process. Electronically Signed   By: Suzy Bouchard M.D.   On: 07/26/2022 15:47   CT Head Wo Contrast  Result Date: 07/26/2022 CLINICAL DATA:  Fall.  COVID positive. EXAM: CT HEAD WITHOUT CONTRAST CT MAXILLOFACIAL WITHOUT CONTRAST CT CERVICAL SPINE WITHOUT CONTRAST TECHNIQUE: Multidetector CT imaging of the head, cervical spine, and maxillofacial structures were performed using the standard protocol without intravenous contrast. Multiplanar CT image reconstructions of the cervical spine and maxillofacial structures were also generated. RADIATION DOSE REDUCTION: This exam was performed according to the departmental dose-optimization program which includes automated exposure control, adjustment of the mA and/or kV according to patient size and/or use of iterative reconstruction technique. COMPARISON:  CT head dated Oct 14, 2016. FINDINGS: CT HEAD FINDINGS Brain: No evidence of acute infarction, hemorrhage, hydrocephalus,  extra-axial collection or mass lesion/mass effect. Progressive atrophy and chronic microvascular ischemic change since 2018. Vascular: No hyperdense vessel or unexpected calcification. Skull: Normal. Negative for fracture or focal lesion. Other: Small left supraorbital scalp hematoma. CT MAXILLOFACIAL FINDINGS Osseous: No fracture or mandibular dislocation. No destructive process. Orbits: Negative. No traumatic or inflammatory finding. Sinuses: Bilateral ethmoid air cell, sphenoid, and maxillary sinus mucosal thickening. No air-fluid levels. Clear mastoid air cells. Soft tissues: Negative. CT CERVICAL SPINE FINDINGS Alignment: No traumatic malalignment. Trace anterolisthesis at C7-T1 and T1-T2. Skull base and vertebrae: No acute fracture. No primary bone lesion or focal pathologic process. Soft tissues and spinal canal: No prevertebral fluid or swelling. No visible canal hematoma. Disc levels: Advanced multilevel disc height loss and uncovertebral hypertrophy from C3-C4 through C6-C7. Advanced right-sided facet arthropathy at C7-T1. Upper chest: 8 x 6 mm solid pulmonary nodule in the left upper lobe, previously 4 x 4 mm in 2022. Other: None. IMPRESSION: 1. No acute intracranial abnormality. Small left supraorbital scalp hematoma. 2. No acute facial fracture. 3. No acute cervical spine fracture or traumatic listhesis. 4. Slowly enlarging 8 x 6 mm solid pulmonary nodule in the left upper lobe, previously 4 x 4 mm in 2022. Primary lung malignancy is not excluded. Consultation with pulmonary medicine or thoracic surgery is suggested, as clinically appropriate. Electronically Signed   By: Titus Dubin M.D.   On: 07/26/2022 13:41   CT Maxillofacial Wo Contrast  Result Date: 07/26/2022 CLINICAL DATA:  Fall.  COVID positive. EXAM: CT HEAD WITHOUT CONTRAST CT MAXILLOFACIAL WITHOUT CONTRAST CT CERVICAL SPINE WITHOUT CONTRAST TECHNIQUE: Multidetector CT imaging of the head, cervical spine, and maxillofacial structures  were performed using the standard protocol without intravenous contrast. Multiplanar CT image reconstructions of the cervical spine and maxillofacial structures were also generated. RADIATION DOSE REDUCTION: This exam was performed according to the departmental dose-optimization program which includes automated exposure control, adjustment of the mA and/or kV according to patient size and/or use of iterative reconstruction technique. COMPARISON:  CT head dated Oct 14, 2016. FINDINGS: CT HEAD FINDINGS Brain: No evidence of acute infarction, hemorrhage, hydrocephalus, extra-axial collection or mass lesion/mass effect. Progressive atrophy and chronic microvascular ischemic change since 2018. Vascular: No hyperdense vessel or  unexpected calcification. Skull: Normal. Negative for fracture or focal lesion. Other: Small left supraorbital scalp hematoma. CT MAXILLOFACIAL FINDINGS Osseous: No fracture or mandibular dislocation. No destructive process. Orbits: Negative. No traumatic or inflammatory finding. Sinuses: Bilateral ethmoid air cell, sphenoid, and maxillary sinus mucosal thickening. No air-fluid levels. Clear mastoid air cells. Soft tissues: Negative. CT CERVICAL SPINE FINDINGS Alignment: No traumatic malalignment. Trace anterolisthesis at C7-T1 and T1-T2. Skull base and vertebrae: No acute fracture. No primary bone lesion or focal pathologic process. Soft tissues and spinal canal: No prevertebral fluid or swelling. No visible canal hematoma. Disc levels: Advanced multilevel disc height loss and uncovertebral hypertrophy from C3-C4 through C6-C7. Advanced right-sided facet arthropathy at C7-T1. Upper chest: 8 x 6 mm solid pulmonary nodule in the left upper lobe, previously 4 x 4 mm in 2022. Other: None. IMPRESSION: 1. No acute intracranial abnormality. Small left supraorbital scalp hematoma. 2. No acute facial fracture. 3. No acute cervical spine fracture or traumatic listhesis. 4. Slowly enlarging 8 x 6 mm solid  pulmonary nodule in the left upper lobe, previously 4 x 4 mm in 2022. Primary lung malignancy is not excluded. Consultation with pulmonary medicine or thoracic surgery is suggested, as clinically appropriate. Electronically Signed   By: Titus Dubin M.D.   On: 07/26/2022 13:41   CT Cervical Spine Wo Contrast  Result Date: 07/26/2022 CLINICAL DATA:  Fall.  COVID positive. EXAM: CT HEAD WITHOUT CONTRAST CT MAXILLOFACIAL WITHOUT CONTRAST CT CERVICAL SPINE WITHOUT CONTRAST TECHNIQUE: Multidetector CT imaging of the head, cervical spine, and maxillofacial structures were performed using the standard protocol without intravenous contrast. Multiplanar CT image reconstructions of the cervical spine and maxillofacial structures were also generated. RADIATION DOSE REDUCTION: This exam was performed according to the departmental dose-optimization program which includes automated exposure control, adjustment of the mA and/or kV according to patient size and/or use of iterative reconstruction technique. COMPARISON:  CT head dated Oct 14, 2016. FINDINGS: CT HEAD FINDINGS Brain: No evidence of acute infarction, hemorrhage, hydrocephalus, extra-axial collection or mass lesion/mass effect. Progressive atrophy and chronic microvascular ischemic change since 2018. Vascular: No hyperdense vessel or unexpected calcification. Skull: Normal. Negative for fracture or focal lesion. Other: Small left supraorbital scalp hematoma. CT MAXILLOFACIAL FINDINGS Osseous: No fracture or mandibular dislocation. No destructive process. Orbits: Negative. No traumatic or inflammatory finding. Sinuses: Bilateral ethmoid air cell, sphenoid, and maxillary sinus mucosal thickening. No air-fluid levels. Clear mastoid air cells. Soft tissues: Negative. CT CERVICAL SPINE FINDINGS Alignment: No traumatic malalignment. Trace anterolisthesis at C7-T1 and T1-T2. Skull base and vertebrae: No acute fracture. No primary bone lesion or focal pathologic process.  Soft tissues and spinal canal: No prevertebral fluid or swelling. No visible canal hematoma. Disc levels: Advanced multilevel disc height loss and uncovertebral hypertrophy from C3-C4 through C6-C7. Advanced right-sided facet arthropathy at C7-T1. Upper chest: 8 x 6 mm solid pulmonary nodule in the left upper lobe, previously 4 x 4 mm in 2022. Other: None. IMPRESSION: 1. No acute intracranial abnormality. Small left supraorbital scalp hematoma. 2. No acute facial fracture. 3. No acute cervical spine fracture or traumatic listhesis. 4. Slowly enlarging 8 x 6 mm solid pulmonary nodule in the left upper lobe, previously 4 x 4 mm in 2022. Primary lung malignancy is not excluded. Consultation with pulmonary medicine or thoracic surgery is suggested, as clinically appropriate. Electronically Signed   By: Titus Dubin M.D.   On: 07/26/2022 13:41    Pending Labs [employer] (From admission, onward)     Start  Ordered   08/02/22 0500  Creatinine, serum  (enoxaparin (LOVENOX)    CrCl >/= 30 ml/min)  Weekly,   R     Comments: while on enoxaparin therapy    07/26/22 1547   07/27/22 0500  Comprehensive metabolic panel  Tomorrow morning,   R        07/26/22 1547   07/27/22 0500  CBC  Tomorrow morning,   R        07/26/22 1547   07/26/22 1044  Urinalysis, Routine w reflex microscopic -Urine, Clean Catch  Once,   URGENT       Question:  Specimen Source  Answer:  Urine, Clean Catch   07/26/22 1043            Vitals/Pain Today's Vitals   07/26/22 1500 07/26/22 1538 07/26/22 1600 07/26/22 1630  BP: (!) 172/70  133/60 128/67  Pulse: 64  68 64  Resp: 18  18 17  $ Temp:  99.2 F (37.3 C)    TempSrc:  Oral    SpO2: 100%  100% 100%  PainSc:        Isolation Precautions Airborne and Contact precautions  Medications Medications  nirmatrelvir/ritonavir (PAXLOVID) 3 tablet (has no administration in time range)  metoprolol tartrate (LOPRESSOR) tablet 12.5 mg (has no administration in time  range)  pravastatin (PRAVACHOL) tablet 40 mg (has no administration in time range)  rOPINIRole (REQUIP) tablet 0.5 mg (has no administration in time range)  budesonide (PULMICORT) nebulizer solution 0.5 mg (has no administration in time range)  sodium chloride flush (NS) 0.9 % injection 3 mL (has no administration in time range)  enoxaparin (LOVENOX) injection 40 mg (has no administration in time range)  acetaminophen (TYLENOL) tablet 650 mg (has no administration in time range)    Or  acetaminophen (TYLENOL) suppository 650 mg (has no administration in time range)  polyethylene glycol (MIRALAX / GLYCOLAX) packet 17 g (has no administration in time range)  0.9 %  sodium chloride infusion (has no administration in time range)  albuterol (PROVENTIL) (2.5 MG/3ML) 0.083% nebulizer solution 2.5 mg (has no administration in time range)  prochlorperazine (COMPAZINE) injection 5 mg (5 mg Intravenous Given 07/26/22 1540)  diphenhydrAMINE (BENADRYL) injection 12.5 mg (12.5 mg Intravenous Given 07/26/22 1539)  ketorolac (TORADOL) 30 MG/ML injection 30 mg (30 mg Intravenous Given 07/26/22 1539)    Mobility walks     Focused Assessments Neuro Assessment Handoff:  Swallow screen pass?  N/a.         Neuro Assessment: Exceptions to WDL (Pt c/o headache and weakness x2 days.) Neuro Checks:      Has TPA been given? No If patient is a Neuro Trauma and patient is going to OR before floor call report to [facility] nurse: [phone] or [phone]   R Recommendations: See Admitting Provider Note  Report given to:   Additional Notes: Paxlovid was not sent to ED

## 2022-07-26 NOTE — ED Triage Notes (Signed)
Pt. Stated, I got up and got a little light headed and fell on hard wood floor.  Pt with laceration above left eye and today was weak with fever of 100 and have a fever .  Pt tested positive for COVID

## 2022-07-26 NOTE — ED Provider Notes (Signed)
[facility] Provider Note   CSN: [id number] Arrival date & time: 07/26/22  1031     History {Add pertinent medical, surgical, social history, OB history to HPI:1} Chief Complaint  Patient presents with   Fall   Fever   Weakness    Warden Canete is a 89 y.o. male.  HPI     89 year old male with a history of hypertension, hyperlipidemia, COPD, polymyalgia rheumatica, restless leg syndrome, transient global amnesia, recent cardiology evaluation for orthostatic hypotension, palpitations 2/1  2 days ago began to have sore throat, felt badly, congestion, headache, hacking cough, fever yesterday 100, chills, not today Haven't eaten anything today, drank some water earlier this AM, has not had anything to drink since, yesterday ate ok.  No chest pain or dyspnea, no leg pain or swelling.  Headache was after the fall.    Wife was having a little sore throat  Don't remember how fall happened.  Laid there about 20 minutes before getting up 2pm yesterday  Urinated standing up and then began to walk to kitchen and fell down Does not remember feeling lightheaded.  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 Authorizing Provider  acetaminophen (TYLENOL) 500 MG tablet Take 500-1,000 mg by mouth every 6 (six) hours as needed (for pain).     [provider]  albuterol (VENTOLIN HFA) 108 (90 Base) MCG/ACT inhaler Inhale 1-2 puffs into the lungs every 6 (six) hours as needed for wheezing or shortness of breath.    [provider]  budesonide (PULMICORT) 0.5 MG/2ML nebulizer solution Take 0.5 mg by nebulization 2 (two) times daily as needed (wheezing/shortness of breath).    [provider]  metoprolol tartrate (LOPRESSOR) 25 MG tablet Take 0.5 tablets (12.5 mg total) by mouth 2 (two) times daily. 07/13/22   Jerline Pain, MD  pravastatin (PRAVACHOL) 40 MG tablet Take 40 mg by mouth in the morning.    [provider]  rOPINIRole (REQUIP) 0.5 MG tablet Take 0.5 mg by mouth at bedtime as needed (restless legs). 10/06/21   [provider]  tamsulosin (FLOMAX) 0.4 MG CAPS capsule Take 0.4 mg by mouth daily as needed (prostate health). 03/06/22   [provider]      Allergies    Codeine and Penicillins    Review of Systems   Review of Systems  Physical Exam Updated Vital Signs BP (!) 162/74 (BP Location: Right Arm)   Pulse 66   Temp 98.3 F (36.8 C) (Oral)   Resp 17   SpO2 100%  Physical Exam  ED Results / Procedures / Treatments   Labs (all labs ordered are listed, but only abnormal results are displayed) Labs Reviewed  BASIC METABOLIC PANEL  CBC  URINALYSIS, ROUTINE W REFLEX MICROSCOPIC  CBG MONITORING, ED    EKG None  Radiology No results found.  Procedures Procedures  {Document cardiac monitor, telemetry assessment procedure when appropriate:1}  Medications Ordered in ED Medications - No data to display  ED Course/ Medical Decision Making/ A&P   {   Click here for ABCD2, HEART and other calculatorsREFRESH Note before signing :1}                          Medical Decision Making Amount and/or Complexity of Data Reviewed Labs: ordered. Radiology: ordered.   ***  {Document critical care time when appropriate:1} {Document review of labs and clinical  decision tools ie heart score, Chads2Vasc2 etc:1}  {Document your independent review of radiology images, and any outside records:1} {Document your discussion with family members, caretakers, and with consultants:1} {Document social determinants of health affecting pt's care:1} {Document your decision making why or why not admission, treatments were needed:1} Final Clinical Impression(s) / ED Diagnoses Final diagnoses:  None    Rx / DC Orders ED Discharge Orders     None

## 2022-07-26 NOTE — ED Notes (Signed)
Patient transported to CT 

## 2022-07-26 NOTE — ED Notes (Signed)
Pt c/o headache, cough, sore throat, and increasing weakness x2 days.  Pt had a positive home COVID test yesterday.

## 2022-07-26 NOTE — ED Notes (Signed)
SBAR received, ready to receive patient  Clinical research associate, Therapist, sports

## 2022-07-26 NOTE — H&P (Signed)
History and Physical   [name] [id number] DOB: Apr 03, 1933 DOA: 07/26/2022  PCP: Antony Contras, MD   Patient coming from: Home  Chief Complaint: Fall/syncope  HPI: [name] is a 89 y.o. male with medical history significant of symptomatic bradycardia, orthostatic hypotension, RLS, chronic bronchitis, anemia, BPH, hyperlipidemia, GERD, hypertension, BPPV presenting with fall/syncope.  Patient reports 2 days of feeling unwell with associated cough, congestion, headache.  Has had 1 day of fever and reports minimal p.o. today with mostly just a little bit of fluids this morning.  Was able to eat okay yesterday.  Fell yesterday when walking from the bathroom to the kitchen.  Unsure how he fell and does not report any prodrome.  States he laid on the floor for around 20 minutes before getting up at 2 PM yesterday does appear to have lost consciousness, also had an episode of vomiting as well.  Reports tested at home for COVID and was positive, reports family member also tested positive.  Repots headache today that improved after headache cocktail in ED. Denies chills, chest pain, shortness of breath, abdominal pain, constipation, diarrhea, nausea, vomiting.  ED Course: Vital signs in ED significant for blood pressure in the [id number] to [id number] systolic.  Lab workup included CMP with glucose 106, calcium 8.7, T. bili 1.4.  CBC showed platelets of 136.  Confirmatory COVID test pending.  Chest x-ray pending.  CT head showed small supraorbital scalp hematoma but otherwise no acute abnormality.  CT C-spine showed no acute normality but did show incidental enlarging pulmonary mass from 4 x 4 mm in 2022 and now 8 x 6 mm.  Patient received Toradol, Compazine, Benadryl for headache in the ED.  Paxlovid per pharmacy has also been ordered.  Review of Systems: As per HPI otherwise all other systems reviewed and are negative.  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 His smoking use included cigarettes. He has never used smokeless tobacco. He reports that he does not drink alcohol and does not use drugs.  Allergies  Allergen Reactions   Codeine Nausea And Vomiting    SICK   Penicillins Other (See Comments)    unknown    Family History  Problem Relation Age of Onset   CVA Mother    Hypertension Mother    Cancer - Prostate Father    Cancer - Lung Brother    Cancer - Lung Brother  Reviewed on admission  Prior to Admission medications   Medication Sig Start Date End Date Taking? Authorizing Provider  acetaminophen (TYLENOL) 500 MG tablet Take 500-1,000 mg by mouth every 6 (six) hours as needed (for pain).    [provider]  albuterol (VENTOLIN HFA) 108 (90 Base) MCG/ACT inhaler Inhale 1-2 puffs into the lungs every 6 (six) hours as needed for wheezing or shortness of breath.    [provider]  budesonide (PULMICORT) 0.5 MG/2ML nebulizer solution Take 0.5 mg by nebulization 2 (two) times daily as needed (wheezing/shortness of breath).    [provider]  metoprolol  tartrate (LOPRESSOR) 25 MG tablet Take 0.5 tablets (12.5 mg total) by mouth 2 (two) times daily. 07/13/22   Jerline Pain, MD  pravastatin (PRAVACHOL) 40 MG tablet Take 40 mg by mouth in the morning.    [provider]  rOPINIRole (REQUIP) 0.5 MG tablet Take 0.5 mg by mouth at bedtime as needed (restless legs). 10/06/21   [provider]  tamsulosin (FLOMAX) 0.4 MG CAPS capsule Take 0.4 mg by mouth daily as needed (prostate health). 03/06/22   [provider]    Physical Exam: Vitals:   07/26/22 1400 07/26/22 1430 07/26/22 1500 07/26/22 1538  BP: 139/80 127/75 (!) 172/70   Pulse: 64 65 64   Resp: (!) 26 (!) 24 18   Temp:    99.2 F (37.3 C)  TempSrc:    Oral  SpO2: 100% 100% 100%     Physical Exam Constitutional:      General: He is not in acute distress.    Appearance: Normal appearance. He is obese.  HENT:     Head: Normocephalic and atraumatic.     Mouth/Throat:     Mouth: Mucous membranes are moist.     Pharynx: Oropharynx is clear.  Eyes:     Extraocular Movements: Extraocular movements intact.     Pupils: Pupils are equal, round, and reactive to light.  Cardiovascular:     Rate and Rhythm: Normal rate and regular rhythm.     Pulses: Normal pulses.     Heart sounds: Normal heart sounds.  Pulmonary:     Effort: Pulmonary effort is normal. No respiratory distress.     Breath sounds: Normal breath sounds.  Abdominal:     General: Bowel sounds are normal. There is no distension.     Palpations: Abdomen is soft.     Tenderness: There is no abdominal tenderness.  Musculoskeletal:        General: No swelling or deformity.  Skin:    General: Skin is warm and dry.  Neurological:     General: No focal deficit present.     Mental Status: Mental status is at baseline.    Labs on Admission: I have personally reviewed following labs and imaging studies  CBC: Recent Labs  Lab 07/26/22 1044  WBC 7.8  HGB 13.7  HCT 39.7  MCV 95.7  PLT 136*     Basic Metabolic Panel: Recent Labs  Lab 07/26/22 1329  NA 137  K 3.8  CL 102  CO2 26  GLUCOSE 106*  BUN 14  CREATININE 1.07  CALCIUM 8.7*    GFR: Estimated Creatinine Clearance: 48.6 mL/min (by C-G formula based on SCr of 1.07 mg/dL).  Liver Function Tests: Recent Labs  Lab 07/26/22 1329  AST 26  ALT 14  ALKPHOS 60  BILITOT 1.4*  PROT 6.7  ALBUMIN 3.8    Urine analysis: No results found  for: "COLORURINE", "APPEARANCEUR", "LABSPEC", "PHURINE", "GLUCOSEU", "HGBUR", "BILIRUBINUR", "KETONESUR", "PROTEINUR", "UROBILINOGEN", "NITRITE", "LEUKOCYTESUR"  Radiological Exams on Admission: CT Head Wo Contrast  Result Date: 07/26/2022 CLINICAL DATA:  Fall.  COVID positive. EXAM: CT HEAD WITHOUT CONTRAST CT MAXILLOFACIAL WITHOUT CONTRAST CT CERVICAL SPINE WITHOUT CONTRAST TECHNIQUE: Multidetector CT imaging of the head, cervical spine, and maxillofacial structures were performed using the standard protocol without intravenous contrast. Multiplanar CT image reconstructions of the cervical spine and maxillofacial structures were also generated. RADIATION DOSE REDUCTION: This exam was performed according to the departmental dose-optimization program which includes automated exposure control, adjustment of the mA and/or kV according to patient size and/or use of iterative reconstruction technique. COMPARISON:  CT head dated Oct 14, 2016. FINDINGS: CT HEAD FINDINGS Brain: No evidence of acute infarction, hemorrhage, hydrocephalus, extra-axial collection or mass lesion/mass effect. Progressive atrophy and chronic microvascular ischemic change since 2018. Vascular: No hyperdense vessel or unexpected calcification. Skull: Normal. Negative for fracture or focal lesion. Other: Small left supraorbital scalp hematoma. CT MAXILLOFACIAL FINDINGS Osseous: No fracture or mandibular dislocation. No destructive process. Orbits: Negative. No traumatic or inflammatory finding. Sinuses: Bilateral ethmoid air  cell, sphenoid, and maxillary sinus mucosal thickening. No air-fluid levels. Clear mastoid air cells. Soft tissues: Negative. CT CERVICAL SPINE FINDINGS Alignment: No traumatic malalignment. Trace anterolisthesis at C7-T1 and T1-T2. Skull base and vertebrae: No acute fracture. No primary bone lesion or focal pathologic process. Soft tissues and spinal canal: No prevertebral fluid or swelling. No visible canal hematoma. Disc levels: Advanced multilevel disc height loss and uncovertebral hypertrophy from C3-C4 through C6-C7. Advanced right-sided facet arthropathy at C7-T1. Upper chest: 8 x 6 mm solid pulmonary nodule in the left upper lobe, previously 4 x 4 mm in 2022. Other: None. IMPRESSION: 1. No acute intracranial abnormality. Small left supraorbital scalp hematoma. 2. No acute facial fracture. 3. No acute cervical spine fracture or traumatic listhesis. 4. Slowly enlarging 8 x 6 mm solid pulmonary nodule in the left upper lobe, previously 4 x 4 mm in 2022. Primary lung malignancy is not excluded. Consultation with pulmonary medicine or thoracic surgery is suggested, as clinically appropriate. Electronically Signed   By: Titus Dubin M.D.   On: 07/26/2022 13:41   CT Maxillofacial Wo Contrast  Result Date: 07/26/2022 CLINICAL DATA:  Fall.  COVID positive. EXAM: CT HEAD WITHOUT CONTRAST CT MAXILLOFACIAL WITHOUT CONTRAST CT CERVICAL SPINE WITHOUT CONTRAST TECHNIQUE: Multidetector CT imaging of the head, cervical spine, and maxillofacial structures were performed using the standard protocol without intravenous contrast. Multiplanar CT image reconstructions of the cervical spine and maxillofacial structures were also generated. RADIATION DOSE REDUCTION: This exam was performed according to the departmental dose-optimization program which includes automated exposure control, adjustment of the mA and/or kV according to patient size and/or use of iterative reconstruction technique. COMPARISON:  CT head dated Oct 14, 2016. FINDINGS: CT HEAD FINDINGS Brain: No evidence of acute infarction, hemorrhage, hydrocephalus, extra-axial collection or mass lesion/mass effect. Progressive atrophy and chronic microvascular ischemic change since 2018. Vascular: No hyperdense vessel or unexpected calcification. Skull: Normal. Negative for fracture or focal lesion. Other: Small left supraorbital scalp hematoma. CT MAXILLOFACIAL FINDINGS Osseous: No fracture or mandibular dislocation. No destructive process. Orbits: Negative. No traumatic or inflammatory finding. Sinuses: Bilateral ethmoid air cell, sphenoid, and maxillary sinus mucosal thickening. No air-fluid levels. Clear mastoid air cells. Soft tissues: Negative. CT CERVICAL SPINE FINDINGS Alignment: No traumatic malalignment. Trace anterolisthesis at C7-T1 and T1-T2. Skull base and vertebrae: No acute fracture. No primary bone lesion  or focal pathologic process. Soft tissues and spinal canal: No prevertebral fluid or swelling. No visible canal hematoma. Disc levels: Advanced multilevel disc height loss and uncovertebral hypertrophy from C3-C4 through C6-C7. Advanced right-sided facet arthropathy at C7-T1. Upper chest: 8 x 6 mm solid pulmonary nodule in the left upper lobe, previously 4 x 4 mm in 2022. Other: None. IMPRESSION: 1. No acute intracranial abnormality. Small left supraorbital scalp hematoma. 2. No acute facial fracture. 3. No acute cervical spine fracture or traumatic listhesis. 4. Slowly enlarging 8 x 6 mm solid pulmonary nodule in the left upper lobe, previously 4 x 4 mm in 2022. Primary lung malignancy is not excluded. Consultation with pulmonary medicine or thoracic surgery is suggested, as clinically appropriate. Electronically Signed   By: Titus Dubin M.D.   On: 07/26/2022 13:41   CT Cervical Spine Wo Contrast  Result Date: 07/26/2022 CLINICAL DATA:  Fall.  COVID positive. EXAM: CT HEAD WITHOUT CONTRAST CT MAXILLOFACIAL WITHOUT CONTRAST CT CERVICAL SPINE WITHOUT  CONTRAST TECHNIQUE: Multidetector CT imaging of the head, cervical spine, and maxillofacial structures were performed using the standard protocol without intravenous contrast. Multiplanar CT image reconstructions of the cervical spine and maxillofacial structures were also generated. RADIATION DOSE REDUCTION: This exam was performed according to the departmental dose-optimization program which includes automated exposure control, adjustment of the mA and/or kV according to patient size and/or use of iterative reconstruction technique. COMPARISON:  CT head dated Oct 14, 2016. FINDINGS: CT HEAD FINDINGS Brain: No evidence of acute infarction, hemorrhage, hydrocephalus, extra-axial collection or mass lesion/mass effect. Progressive atrophy and chronic microvascular ischemic change since 2018. Vascular: No hyperdense vessel or unexpected calcification. Skull: Normal. Negative for fracture or focal lesion. Other: Small left supraorbital scalp hematoma. CT MAXILLOFACIAL FINDINGS Osseous: No fracture or mandibular dislocation. No destructive process. Orbits: Negative. No traumatic or inflammatory finding. Sinuses: Bilateral ethmoid air cell, sphenoid, and maxillary sinus mucosal thickening. No air-fluid levels. Clear mastoid air cells. Soft tissues: Negative. CT CERVICAL SPINE FINDINGS Alignment: No traumatic malalignment. Trace anterolisthesis at C7-T1 and T1-T2. Skull base and vertebrae: No acute fracture. No primary bone lesion or focal pathologic process. Soft tissues and spinal canal: No prevertebral fluid or swelling. No visible canal hematoma. Disc levels: Advanced multilevel disc height loss and uncovertebral hypertrophy from C3-C4 through C6-C7. Advanced right-sided facet arthropathy at C7-T1. Upper chest: 8 x 6 mm solid pulmonary nodule in the left upper lobe, previously 4 x 4 mm in 2022. Other: None. IMPRESSION: 1. No acute intracranial abnormality. Small left supraorbital scalp hematoma. 2. No acute facial  fracture. 3. No acute cervical spine fracture or traumatic listhesis. 4. Slowly enlarging 8 x 6 mm solid pulmonary nodule in the left upper lobe, previously 4 x 4 mm in 2022. Primary lung malignancy is not excluded. Consultation with pulmonary medicine or thoracic surgery is suggested, as clinically appropriate. Electronically Signed   By: Titus Dubin M.D.   On: 07/26/2022 13:41    EKG: Independently reviewed.  Sinus rhythm at 65 bpm.  Right bundle branch block with left anterior fascicular block.  Some baseline wander in V5.  Assessment/Plan Principal Problem:   Syncope and collapse Active Problems:   Essential hypertension   Hyperlipidemia   Chronic bronchitis (HCC)   Anemia   Gastroesophageal reflux disease without esophagitis   Benign non-nodular prostatic hyperplasia with lower urinary tract symptoms   Orthostatic hypotension   Restless legs   Syncope and collapse History of orthostatic hypotension History of symptomatic bradycardia > Patient presenting  after an episode of syncope and collapse at home.  No prodrome and occurred while he was walking from the bathroom to the kitchen. > Believes he lost consciousness and was down for about 20 minutes before getting up for help. > Small hematoma of the scalp on CT head but otherwise no acute abnormality.  > Etiology most likely secondary to history of orthostatic hypotension and decreased p.o. intake in the setting of COVID as below.  However does also have history of symptomatic bradycardia. > Previously worked up for symptomatic bradycardia as well as orthostatic hypotension.  Has had amlodipine, carvedilol, losartan discontinued; Tamsulosin held.  Currently on metoprolol 12.5 mg twice daily. - Monitor on telemetry - Orthostatic vital signs - Echocardiogram - IV fluids  COVID-19 infection > Couple days of feeling unwell with congestion, cough and 1 day of fever.  Relative tested positive for COVID recently.  Reports positive  home test. - Continue with Paxlovid as ordered in the ED - Follow-up confirmatory COVID test  RLS - Continue as needed ropinirole  BPH - Tamsulosin on hold per chart  Hyperlipidemia - Continue home pravastatin  Hypertension > Also with orthostatic hypotension history as above - Continue home metoprolol for now  Chronic bronchitis - Continue home Pulmicort and as needed albuterol  Pulmonary nodule > CT imaging noted incidental enlarging pulmonary nodule in upper lobe of 8 x 6 mm increased from 4 x 4 mm in 2022.  With recommendation for considering follow-up with pulmonology.   DVT prophylaxis: Lovenox Code Status:   Full Family Communication:  Updated at bedside  Disposition Plan:   Patient is from:  Home  Anticipated DC to:  Home  Anticipated DC date:  1 to 3 days  Anticipated DC barriers: None  Consults called:  None Admission status:  Observation, telemetry  Severity of Illness: The appropriate patient status for this patient is OBSERVATION. Observation status is judged to be reasonable and necessary in order to provide the required intensity of service to ensure the patient's safety. The patient's presenting symptoms, physical exam findings, and initial radiographic and laboratory data in the context of their medical condition is felt to place them at decreased risk for further clinical deterioration. Furthermore, it is anticipated that the patient will be medically stable for discharge from the hospital within 2 midnights of admission.    Marcelyn Bruins MD [facility]  How to contact the [facility] Attending or Consulting provider [place] or covering provider during after hours [place], for this patient?   Check the care team in [facility] and look for a) attending/consulting [facility] provider listed and b) the [facility] team listed Log into [email or web address] and use [facility] universal password to access. If you do not have the password, please contact the hospital  operator. Locate the [facility] provider you are looking for under [facility] and page to a number that you can be directly reached. If you still have difficulty reaching the provider, please page the [facility] (Director on Call) for the Hospitalists listed on amion for assistance.  07/26/2022, 3:47 PM

## 2022-07-26 NOTE — Care Plan (Signed)
Pt arrived to room via stretcher and transport.  He is A&O, ambulatory from stretcher to bed.  Lungs CTA, Irregular rate noted.  Patient has laceration above left eye and bruising noted to left elbow, upper arm area.  Right hearing aid present, he states left is in his personal belongings bag.  He is quite [facility] and must talk loud. Pt has regular diet and I will order his food.  NADN.  Very pleasant.  [place], RN

## 2022-07-27 ENCOUNTER — Observation Stay (HOSPITAL_COMMUNITY): Payer: Medicare Other

## 2022-07-27 DIAGNOSIS — Z79899 Other long term (current) drug therapy: Secondary | ICD-10-CM | POA: Diagnosis not present

## 2022-07-27 DIAGNOSIS — R55 Syncope and collapse: Secondary | ICD-10-CM

## 2022-07-27 DIAGNOSIS — Z885 Allergy status to narcotic agent status: Secondary | ICD-10-CM | POA: Diagnosis not present

## 2022-07-27 DIAGNOSIS — E785 Hyperlipidemia, unspecified: Secondary | ICD-10-CM | POA: Diagnosis present

## 2022-07-27 DIAGNOSIS — Z789 Other specified health status: Secondary | ICD-10-CM | POA: Diagnosis not present

## 2022-07-27 DIAGNOSIS — Z823 Family history of stroke: Secondary | ICD-10-CM | POA: Diagnosis not present

## 2022-07-27 DIAGNOSIS — R001 Bradycardia, unspecified: Secondary | ICD-10-CM | POA: Diagnosis present

## 2022-07-27 DIAGNOSIS — Z96611 Presence of right artificial shoulder joint: Secondary | ICD-10-CM | POA: Diagnosis present

## 2022-07-27 DIAGNOSIS — R911 Solitary pulmonary nodule: Secondary | ICD-10-CM | POA: Diagnosis present

## 2022-07-27 DIAGNOSIS — Z8249 Family history of ischemic heart disease and other diseases of the circulatory system: Secondary | ICD-10-CM | POA: Diagnosis not present

## 2022-07-27 DIAGNOSIS — J4489 Other specified chronic obstructive pulmonary disease: Secondary | ICD-10-CM | POA: Diagnosis present

## 2022-07-27 DIAGNOSIS — Z85828 Personal history of other malignant neoplasm of skin: Secondary | ICD-10-CM | POA: Diagnosis not present

## 2022-07-27 DIAGNOSIS — H8110 Benign paroxysmal vertigo, unspecified ear: Secondary | ICD-10-CM | POA: Diagnosis present

## 2022-07-27 DIAGNOSIS — S0003XA Contusion of scalp, initial encounter: Secondary | ICD-10-CM | POA: Diagnosis present

## 2022-07-27 DIAGNOSIS — D649 Anemia, unspecified: Secondary | ICD-10-CM | POA: Diagnosis present

## 2022-07-27 DIAGNOSIS — N401 Enlarged prostate with lower urinary tract symptoms: Secondary | ICD-10-CM | POA: Diagnosis present

## 2022-07-27 DIAGNOSIS — Z880 Allergy status to penicillin: Secondary | ICD-10-CM | POA: Diagnosis not present

## 2022-07-27 DIAGNOSIS — U071 COVID-19: Secondary | ICD-10-CM | POA: Diagnosis present

## 2022-07-27 DIAGNOSIS — M353 Polymyalgia rheumatica: Secondary | ICD-10-CM | POA: Diagnosis present

## 2022-07-27 DIAGNOSIS — K219 Gastro-esophageal reflux disease without esophagitis: Secondary | ICD-10-CM | POA: Diagnosis present

## 2022-07-27 DIAGNOSIS — Z87891 Personal history of nicotine dependence: Secondary | ICD-10-CM | POA: Diagnosis not present

## 2022-07-27 DIAGNOSIS — Y92009 Unspecified place in unspecified non-institutional (private) residence as the place of occurrence of the external cause: Secondary | ICD-10-CM | POA: Diagnosis not present

## 2022-07-27 DIAGNOSIS — Z7951 Long term (current) use of inhaled steroids: Secondary | ICD-10-CM | POA: Diagnosis not present

## 2022-07-27 DIAGNOSIS — I10 Essential (primary) hypertension: Secondary | ICD-10-CM | POA: Diagnosis present

## 2022-07-27 DIAGNOSIS — W1830XA Fall on same level, unspecified, initial encounter: Secondary | ICD-10-CM | POA: Diagnosis present

## 2022-07-27 DIAGNOSIS — I951 Orthostatic hypotension: Secondary | ICD-10-CM | POA: Diagnosis present

## 2022-07-27 DIAGNOSIS — G2581 Restless legs syndrome: Secondary | ICD-10-CM | POA: Diagnosis present

## 2022-07-27 LAB — COMPREHENSIVE METABOLIC PANEL
ALT: 16 U/L (ref 0–44)
AST: 25 U/L (ref 15–41)
Albumin: 3.3 g/dL — ABNORMAL LOW (ref 3.5–5.0)
Alkaline Phosphatase: 52 U/L (ref 38–126)
Anion gap: 11 (ref 5–15)
BUN: 23 mg/dL (ref 8–23)
CO2: 20 mmol/L — ABNORMAL LOW (ref 22–32)
Calcium: 8.3 mg/dL — ABNORMAL LOW (ref 8.9–10.3)
Chloride: 103 mmol/L (ref 98–111)
Creatinine, Ser: 1.07 mg/dL (ref 0.61–1.24)
GFR, Estimated: >60 mL/min (ref >60)
Glucose, Bld: 119 mg/dL — ABNORMAL HIGH (ref 70–99)
Potassium: 3.6 mmol/L (ref 3.5–5.1)
Sodium: 134 mmol/L — ABNORMAL LOW (ref 135–145)
Total Bilirubin: 1 mg/dL (ref 0.3–1.2)
Total Protein: 5.9 g/dL — ABNORMAL LOW (ref 6.5–8.1)

## 2022-07-27 LAB — ECHOCARDIOGRAM COMPLETE
Area-P 1/2: 2.36 cm2
Calc EF: 64.7 %
P 1/2 time: 705 msec
Single Plane A2C EF: 61.8 %
Single Plane A4C EF: 64.4 %
Weight: 2691.38 oz

## 2022-07-27 LAB — GLUCOSE, CAPILLARY: Glucose-Capillary: 115 mg/dL — ABNORMAL HIGH (ref 70–99)

## 2022-07-27 LAB — CBC
HCT: 34.9 % — ABNORMAL LOW (ref 39.0–52.0)
Hemoglobin: 12.7 g/dL — ABNORMAL LOW (ref 13.0–17.0)
MCH: 33.8 pg (ref 26.0–34.0)
MCHC: 36.4 g/dL — ABNORMAL HIGH (ref 30.0–36.0)
MCV: 92.8 fL (ref 80.0–100.0)
Platelets: 122 10*3/uL — ABNORMAL LOW (ref 150–400)
RBC: 3.76 MIL/uL — ABNORMAL LOW (ref 4.22–5.81)
RDW: 13 % (ref 11.5–15.5)
WBC: 6.8 10*3/uL (ref 4.0–10.5)
nRBC: 0 % (ref 0.0–0.2)

## 2022-07-27 MED ORDER — SODIUM CHLORIDE 0.9 % IV SOLN: INTRAVENOUS | Status: AC

## 2022-07-27 MED ORDER — POTASSIUM CHLORIDE CRYS ER 20 MEQ PO TBCR
40.0000 meq | EXTENDED_RELEASE_TABLET | Freq: Once | ORAL | Status: AC
  Administered 2022-07-27: 40 meq via ORAL
  Filled 2022-07-27: qty 2

## 2022-07-27 MED ORDER — SODIUM CHLORIDE 0.9 % IV SOLN: INTRAVENOUS | Status: DC

## 2022-07-27 NOTE — Plan of Care (Signed)
  Problem: Education: Goal: Knowledge of condition and prescribed therapy will improve Outcome: Progressing   Problem: Education: Goal: Knowledge of General Education information will improve Description: Including pain rating scale, medication(s)/side effects and non-pharmacologic comfort measures Outcome: Progressing   Problem: Clinical Measurements: Goal: Ability to maintain clinical measurements within normal limits will improve Outcome: Progressing

## 2022-07-27 NOTE — Evaluation (Signed)
Physical Therapy Evaluation  Patient Details Name: [name] MRN: [id number] DOB: [birth year] Today's Date: 07/27/2022  History of Present Illness  Pt is an 89 y.o. male presenting 07/26/2022 after fall with syncope and complaining of headache, cough, sore throat, and progressive weakness. COVID positive. CT cervical spine revealed small L supraorbital scalp hematoma, and slowly enlarging 8x15m solid pulmonary nodule in the L upper lobe, previoysly 4x4110min 2022.  PMH significant for symptomatic bradycardia, orthostatic hypotension, RLS, chronic bronchitis, anemia, BPH, hyperlipidemia, GERD, hypertension, BPPV   Clinical Impression  Pt admitted with above diagnosis. Pt currently with functional limitations due to the deficits listed below (see PT Problem List). At the time of PT eval pt was able to perform transfers and ambulation with gross min guard assist to supervision for safety and no AD. Pt motivated to work with therapies, however he is near baseline of function. Recommend more frequent follow up with our Mobility Specialist team to decrease risk for functional decline while admitted. Acutely, pt will benefit from skilled PT to increase their independence and safety with mobility to allow discharge to the venue listed below.      Recommendations for follow up therapy are one component of a multi-disciplinary discharge planning process, led by the attending physician.  Recommendations may be updated based on patient status, additional functional criteria and insurance authorization.  Follow Up Recommendations No PT follow up      Assistance Recommended at Discharge PRN  Patient can return home with the following  A little help with walking and/or transfers;A little help with bathing/dressing/bathroom;Assistance with cooking/housework;Assist for transportation;Help with stairs or ramp for entrance    Equipment Recommendations    Recommendations for Other Services       Functional  Status Assessment Patient has had a recent decline in their functional status and demonstrates the ability to make significant improvements in function in a reasonable and predictable amount of time.     Precautions / Restrictions Precautions Precautions: Fall Precaution Comments: Covid Restrictions Weight Bearing Restrictions: No      Mobility  Bed Mobility Overal bed mobility: Modified Independent                  Transfers Overall transfer level: Needs assistance Equipment used: None Transfers: Sit to/from Stand Sit to Stand: Supervision           General transfer comment: Light supervision for safety as pt powered up to full stand. No assist required. Pt appears guarded but no overt LOB noted.    Ambulation/Gait Ambulation/Gait assistance: Min guard Gait Distance (Feet): 1020 Feet Assistive device: None, IV Pole Gait Pattern/deviations: Step-through pattern, Decreased stride length, Trunk flexed, Narrow base of support, Drifts right/left Gait velocity: Decreased Gait velocity interpretation: 1.31 - 2.62 ft/sec, indicative of limited community ambulator   General Gait Details: Initially with IV pole and progressed to no UE support. Pt initially drifting R and L frequently, but no overt LOB. Pt motivated for distance and was able to complete 2 laps around the unit without any noted dyspnea. Pt on RA throughout with SpO2 95-97% and HR 68 at end of gait training.  Stairs            Wheelchair Mobility    Modified Rankin (Stroke Patients Only)       Balance Overall balance assessment: Needs assistance Sitting-balance support: No upper extremity supported, Feet supported Sitting balance-Leahy Scale: Good Sitting balance - Comments: Able to bring leg up with hip flexion to don sock  Standing balance support: No upper extremity supported, During functional activity Standing balance-Leahy Scale: Fair Standing balance comment: min guard A during  functional mobility. Min furniture surfing observed             High level balance activites: Head turns High Level Balance Comments: No changes with horizontal head turns. Noted slight imbalance with vertical (looking down more than looking up).             Pertinent Vitals/Pain Pain Assessment Pain Assessment: Faces Faces Pain Scale: No hurt Pain Intervention(s): Monitored during session    Home Living Family/patient expects to be discharged to:: Private residence Living Arrangements: Spouse/significant other Available Help at Discharge: Family;Available PRN/intermittently Type of Home: House Home Access: Stairs to enter Entrance Stairs-Rails: None Entrance Stairs-Number of Steps: 2-3   Home Layout: Laundry or work area in basement (L side rail) Home Equipment: Conservation officer, nature (2 wheels);Shower seat      Prior Function Prior Level of Function : Independent/Modified Independent             Mobility Comments: no AD ADLs Comments: No assist for ADL, performs most of the cleaning in his home as spouse with prior CVA and decr mobility. Pt reports he usually eats out.     Hand Dominance        Extremity/Trunk Assessment   Upper Extremity Assessment Upper Extremity Assessment: Defer to OT evaluation    Lower Extremity Assessment Lower Extremity Assessment: Overall WFL for tasks assessed (Strength 4+ to 5/5 in quads, hamstrings, hip flexors, ankle DF)    Cervical / Trunk Assessment Cervical / Trunk Assessment: Other exceptions Cervical / Trunk Exceptions: Forward head posture with rounded shoulders  Communication   Communication: No difficulties  Cognition Arousal/Alertness: Awake/alert Behavior During Therapy: WFL for tasks assessed/performed Overall Cognitive Status: Impaired/Different from baseline Area of Impairment: Memory, Problem solving, Safety/judgement                     Memory: Decreased short-term memory   Safety/Judgement:  Decreased awareness of safety, Decreased awareness of deficits   Problem Solving: Slow processing, Difficulty sequencing, Requires verbal cues General Comments: Pt does not recall OT session this morning. 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        General Comments      Exercises     Assessment/Plan    PT Assessment Patient needs continued PT services  PT Problem List Decreased strength;Decreased range of motion;Decreased activity tolerance;Decreased balance;Decreased mobility;Decreased safety awareness;Decreased knowledge of precautions       PT Treatment Interventions DME instruction;Gait training;Stair training;Functional mobility training;Therapeutic activities;Therapeutic exercise;Balance training;Patient/family education    PT Goals (Current goals can be found in the Care Plan section)  Acute Rehab PT Goals Patient Stated Goal: Home to his wife ASAP PT Goal Formulation: With patient Time For Goal Achievement: 08/10/22 Potential to Achieve Goals: Good    Frequency Min 2X/week     Co-evaluation               AM-PAC PT "6 Clicks" Mobility  Outcome Measure Help needed turning from your back to your side while in a flat bed without using bedrails?: None Help needed moving from lying on your back to sitting on the side of a flat bed without using bedrails?: None Help needed moving to and from a bed to a chair (including a wheelchair)?: A Little Help needed standing up from a chair using your arms (e.g., wheelchair  or bedside chair)?: A Little Help needed to walk in hospital room?: A Little Help needed climbing 3-5 steps with a railing? : A Little 6 Click Score: 20    End of Session Equipment Utilized During Treatment: Gait belt Activity Tolerance: Patient tolerated treatment well Patient left: in chair;with call bell/phone within reach;with chair alarm set Nurse Communication: Mobility status PT Visit Diagnosis: Unsteadiness on  feet (R26.81);Difficulty in walking, not elsewhere classified (R26.2)    Time: 1416-1440 PT Time Calculation (min) (ACUTE ONLY): 24 min   Charges:   PT Evaluation $PT Eval Moderate Complexity: 1 Mod PT Treatments $Gait Training: 8-22 mins        Rolinda Roan, PT, DPT Acute Rehabilitation Services Secure Chat Preferred Office: [phone]   Thelma Comp 07/27/2022, 3:57 PM

## 2022-07-27 NOTE — Progress Notes (Signed)
Pt being transferred to chair by PT, as they finish their session.

## 2022-07-27 NOTE — Evaluation (Signed)
Occupational Therapy Evaluation Patient Details Name: [name] MRN: [id number] DOB: 1933/04/07 Today's Date: 07/27/2022   History of Present Illness Pt is an 89 y.o. male presenting 07/26/2022 after fall with syncope and complaining of headache, cough, sore throat, and progressive weakness. COVID positive. CT cervical spine revealed small L supraorbital scalp hematoma, and slowly enlarging 8x12m solid pulmonary nodule in the L upper lobe, previoysly 4x466min 2022.  PMH significant for symptomatic bradycardia, orthostatic hypotension, RLS, chronic bronchitis, anemia, BPH, hyperlipidemia, GERD, hypertension, BPPV   Clinical Impression   PTA, pt lived alone and was mod I for ADL and IADL. Upon eval, pt presenting with generalized weakness, decreased safety awareness, slowed processing, and decreased activity tolerance. Pt requiring min guard a for LB dressing, toilet transfer, and retrieving items off floor during session. Pt with decreased safety awareness as indicated by attempts to continue moving away from safe areas despite becoming dizzy, and requiring cues to initiate seated rest break. VSS during session and pt's niece reporting orthostatic vitals have already been taken this morning. Pt and family interested in home health therapies and pt could benefit to optimize safety and independence in ADL and IADL.      Recommendations for follow up therapy are one component of a multi-disciplinary discharge planning process, led by the attending physician.  Recommendations may be updated based on patient status, additional functional criteria and insurance authorization.   Follow Up Recommendations  Home health OT     Assistance Recommended at Discharge Intermittent Supervision/Assistance  Patient can return home with the following A little help with walking and/or transfers;A little help with bathing/dressing/bathroom;Assistance with cooking/housework;Help with stairs or ramp for entrance;Assist  for transportation;Direct supervision/assist for financial management;Direct supervision/assist for medications management    Functional Status Assessment  Patient has had a recent decline in their functional status and demonstrates the ability to make significant improvements in function in a reasonable and predictable amount of time.  Equipment Recommendations  None recommended by OT    Recommendations for Other Services       Precautions / Restrictions Precautions Precautions: Fall Restrictions Weight Bearing Restrictions: No      Mobility Bed Mobility Overal bed mobility: Modified Independent                  Transfers Overall transfer level: Needs assistance Equipment used: None Transfers: Sit to/from Stand Sit to Stand: Supervision, Min guard           General transfer comment: Min guard on initial attempt due to slightly impulsive. supervision for safety on additional attempts      Balance Overall balance assessment: Needs assistance Sitting-balance support: No upper extremity supported, Feet supported Sitting balance-Leahy Scale: Good Sitting balance - Comments: Able to bring leg up with hip flexion to don sock   Standing balance support: No upper extremity supported, During functional activity Standing balance-Leahy Scale: Fair Standing balance comment: min guard A during functional mobility. Min furniture surfing observed                           ADL either performed or assessed with clinical judgement   ADL Overall ADL's : Needs assistance/impaired Eating/Feeding: Independent   Grooming: Wash/dry hands;Min guard;Standing   Upper Body Bathing: Set up;Sitting   Lower Body Bathing: Min guard;Sit to/from stand   Upper Body Dressing : Set up;Sitting   Lower Body Dressing: Min guard;Sit to/from stand Lower Body Dressing Details (indicate cue type and reason):  donning socks sitting EOB with compensatory techniques after intial  education Toilet Transfer: Min guard;Ambulation;Regular Glass blower/designer Details (indicate cue type and reason): simulated in room Toileting- Clothing Manipulation and Hygiene: Set up;Sitting/lateral lean   Tub/ Shower Transfer: Tub transfer;Min guard;Ambulation Tub/Shower Transfer Details (indicate cue type and reason): observed to hold onto wall Functional mobility during ADLs: Min guard General ADL Comments: Pt with intermittent dizziness     Vision Ability to See in Adequate Light: 0 Adequate Patient Visual Report: No change from baseline Vision Assessment?: No apparent visual deficits Additional Comments: wears reading glasses at baseline.     Perception     Praxis      Pertinent Vitals/Pain Pain Assessment Pain Assessment: 0-10 Pain Score: 5  Pain Location: chest Pain Descriptors / Indicators: Sore Pain Intervention(s): Limited activity within patient's tolerance, Monitored during session, Repositioned     Hand Dominance     Extremity/Trunk Assessment Upper Extremity Assessment Upper Extremity Assessment: Generalized weakness;LUE deficits/detail LUE Deficits / Details: R side 4/5; L 4-/5   Lower Extremity Assessment Lower Extremity Assessment: Defer to PT evaluation       Communication Communication Communication: No difficulties   Cognition Arousal/Alertness: Awake/alert Behavior During Therapy: WFL for tasks assessed/performed Overall Cognitive Status: Impaired/Different from baseline Area of Impairment: Memory, Problem solving, Safety/judgement                     Memory: Decreased short-term memory   Safety/Judgement: Decreased awareness of safety, Decreased awareness of deficits   Problem Solving: Slow processing, Difficulty sequencing, Requires verbal cues General Comments: Pt recalling 2/3 words on delayed memory recall. Not oriented to day of week but oriented to month and year. Pt with good references to use of compensatory  techniques for keeping up with appointments, etc. Mod difficulty with simple money management task. Slightly impulsive with movement     General Comments  VSS. grand daughter who is trauma RN at [facility] present    Exercises     Shoulder Instructions      [place] expects to be discharged to:: Private residence Living Arrangements: Spouse/significant other Available Help at Discharge: Family;Available PRN/intermittently Type of Home: House Home Access: Stairs to enter [employer] of Steps: 2-3 Entrance Stairs-Rails: None Home Layout: Laundry or work area in basement (L side rail)     Bathroom Shower/Tub: Teacher, early years/pre: Standard     Home Equipment: Conservation officer, nature (2 wheels);Shower seat          Prior Functioning/Environment Prior Level of Function : Independent/Modified Independent             Mobility Comments: no AD ADLs Comments: No assist for ADL, performs most of the cleaning in his home as spouse with prior CVA and decr mobility. Pt reports he usually eats out.        OT Problem List: Decreased strength;Decreased activity tolerance;Impaired balance (sitting and/or standing);Pain;Decreased knowledge of precautions;Cardiopulmonary status limiting activity;Decreased safety awareness;Decreased cognition      OT Treatment/Interventions: Self-care/ADL training;Therapeutic exercise;Balance training;Patient/family education;Cognitive remediation/compensation;Therapeutic activities;DME and/or AE instruction    OT Goals(Current goals can be found in the care plan section) Acute Rehab OT Goals Patient Stated Goal: none stated OT Goal Formulation: With patient/family Time For Goal Achievement: 08/10/22 Potential to Achieve Goals: Good  OT Frequency: Min 2X/week    Co-evaluation              AM-PAC OT "6 Clicks" Daily Activity  Outcome Measure Help from another person eating meals?: None Help from another person  taking care of personal grooming?: A Little Help from another person toileting, which includes using toliet, bedpan, or urinal?: A Little Help from another person bathing (including washing, rinsing, drying)?: A Little Help from another person to put on and taking off regular upper body clothing?: A Little Help from another person to put on and taking off regular lower body clothing?: A Little 6 Click Score: 19   End of Session Equipment Utilized During Treatment: Gait belt Nurse Communication: Mobility status  Activity Tolerance: Patient tolerated treatment well Patient left: in bed;with call bell/phone within reach;with bed alarm set;Other (comment) (ECHO entering room)  OT Visit Diagnosis: Unsteadiness on feet (R26.81);Muscle weakness (generalized) (M62.81);Other symptoms and signs involving cognitive function;Pain Pain - part of body:  (chest)                Time: [id number] OT Time Calculation (min): 26 min Charges:  OT General Charges $OT Visit: 1 Visit OT Evaluation $OT Eval Low Complexity: 1 Low OT Treatments $Self Care/Home Management : 8-22 mins Elder Cyphers, OTR/L [facility] Acute Rehabilitation Office: [phone]   Magnus Ivan 07/27/2022, 11:08 AM

## 2022-07-27 NOTE — Progress Notes (Signed)
  Echocardiogram 2D Echocardiogram has been performed.  [name] 07/27/2022, 10:55 AM

## 2022-07-27 NOTE — Progress Notes (Addendum)
PROGRESS NOTE    [name]  [id number] DOB: 11/21/1932 DOA: 07/26/2022 PCP: Antony Contras, MD    Brief Narrative:   [name] is a 89 y.o. male with past medical history significant for essential hypertension, hyperlipidemia, symptomatic bradycardia, orthostatic hypotension, RLS, chronic bronchitis, anemia, BPH, GERD, BPPV who presented to [facility] ED on 2/14 following a syncopal episode at home.  He reports feeling unwell over the previous 2 days with associated cough, congestion and headache.  Also endorses decreased oral intake.  Patient states he was walking from the bathroom to the kitchen and unsure how he fell and does not report any prodrome.  He apparently laid on the floor for roughly 20 minutes before getting up.  Reports he tested at home for COVID and was positive, also an additional family member at home tested positive as well.  Denies chills, no chest pain, no shortness of breath, no abdominal pain, no nausea/vomiting/diarrhea.  In the ED, temperature 98.3 F, HR 66, RR 17, BP 162/74, SpO2 100% on room air.  WBC 7.8, hemoglobin 13.7, platelets 136.  Sodium 137, potassium 3.8, chloride 102, CO2 26, glucose 106, BUN 14, creatinine 1.07.  AST 26, ALT 14, total bilirubin 1.4.  Covid-19 PCR positive.  Influenza A/B/RSV PCR negative.  Chest x-ray with no acute cardiopulmonary disease findings.  CT head/C-spine/maxillofacial without contrast with no acute intracranial abnormality, small left supraorbital scalp hematoma, no acute facial fracture, no acute cervical spine fracture or traumatic listhesis, but notable for slowly enlarging 8 x 6 mm solid pulmonary nodule left upper lobe.  Patient received Toradol, Compazine, Benadryl for headache in the ED.  Started on Paxlovid.  TRH consulted for admission for further evaluation and management of syncope in the setting of COVID-19 viral infection and poor oral intake.  Assessment & Plan:   Syncope and collapse Hx orthostatic  hypotension/symptomatic bradycardia Patient presenting to ED after an episode of syncope at home, denies prodrome and occurred while walking from the bathroom to the kitchen.  Roughly 20 minutes loss of consciousness.  CT head/C-spine/maxillofacial without contrast with no acute intracranial or facial findings other than scalp hematoma.  Etiology likely secondary to history of orthostatic hypotension in the setting of decreased oral intake with acute COVID-19 viral infection. -- TTE: Pending -- Continue IVF with NS at 160m/hr -- Orthostatic vital signs daily -- PT/OT evaluation -- Monitor on telemetry -- Supportive care, fall precautions  COVID-19 viral infection Patient asymptomatic, not requiring oxygen. -- Paxlovid x 5 days -- Airborne/contact isolation precautions  Pulmonary Nodule CT on admission notable for slowly enlarging solid pulmonary nodule left upper lobe measuring 8 x 6 mm.  Patient has followed with pulmonology in the past, Dr. CAlcide Cleverin ASamsonNPerry  Will need close outpatient follow-up with pulmonology with repeat scan, consideration of PET/CT and biopsy.  Essential hypertension Patient previously on amlodipine, carvedilol, losartan which has been discontinued. -- Continue metoprolol tartrate 12.5 mg p.o. twice daily  Hyperlipidemia -- Continue pravastatin 40 mg p.o. daily  Restless leg syndrome -- Continue ropinirole 0.5 mg nightly as needed for restless legs  BPH -- Holding home tamsulosin in the setting of orthostatic hypotension   DVT prophylaxis: enoxaparin (LOVENOX) injection 40 mg Start: 07/26/22 2200    Code Status: Full Code Family Communication: Updated family this morning  Disposition Plan:  Level of care: Telemetry Medical Status is: Inpatient Remains inpatient appropriate because: Remains orthostatic, IV fluid hydration, pending TTE and PT/OT evaluation.  Patient cares for spouse who has dementia  and anticipate will need to go home with likely  home health.  Anticipate discharge in 1-2 days    Consultants:  None  Procedures:  TTE: Pending  Antimicrobials:  None   Subjective: Patient seen examined bedside, resting comfortably.  Lying in bed.  Orthostatic vital signs performed this morning, became symptomatic after standing for 3 minutes with associated dizziness.  Also reports nonproductive cough.  Remains on IV fluid hydration.  Updated family.  Pending TTE, PT/OT evaluation.  No other specific questions or concerns at this time.  Denies headache, no visual changes, no chest pain, no palpitations, no shortness of breath, no abdominal pain, no fever/chills/night sweats, no nausea cefonicid diarrhea, no focal weakness, no fatigue, no paresthesias.  No acute events overnight per nursing staff.  Objective: Vitals:   07/27/22 0000 07/27/22 0500 07/27/22 0500 07/27/22 0711  BP: (!) 140/74  (!) 117/51 126/68  Pulse: 70  65   Resp: 18  18   Temp: 98.5 F (36.9 C)  99.8 F (37.7 C) 98.7 F (37.1 C)  TempSrc: Oral  Oral Oral  SpO2: 96%  97% 98%  Weight:  76.3 kg      Intake/Output Summary (Last 24 hours) at 07/27/2022 1103 Last data filed at 07/27/2022 0700 Gross per 24 hour  Intake 120 ml  Output 200 ml  Net -80 ml   Filed Weights   07/27/22 0500  Weight: 76.3 kg    Examination:  Physical Exam: GEN: NAD, alert and oriented to person (President: Mr. Barbette Or), [facility]), but not time (1922), elderly in appearance HEENT: PERRL, EOMI, sclera clear, dry mucous membranes, noted left eyebrow abrasion with surrounding ecchymosis, left orbital ecchymosis noted PULM: CTAB w/o wheezes/crackles, normal respiratory effort, on room air CV: RRR w/o M/G/R GI: abd soft, NTND, NABS, no R/G/M MSK: no peripheral edema, moves all extremities independently NEURO: No focal deficits PSYCH: normal mood/affect Integumentary: Left ocular orbital ecchymosis, left eyebrow abrasion/ecchymosis noted, left upper extremity ecchymosis noted,  otherwise no other concerning rashes/lesions/wounds noted on exposed skin surfaces    Data Reviewed: I have personally reviewed following labs and imaging studies  CBC: Recent Labs  Lab 07/26/22 1044 07/27/22 0356  WBC 7.8 6.8  HGB 13.7 12.7*  HCT 39.7 34.9*  MCV 95.7 92.8  PLT 136* [id number]*   Basic Metabolic Panel: Recent Labs  Lab 07/26/22 1329 07/27/22 0356  NA 137 134*  K 3.8 3.6  CL 102 103  CO2 26 20*  GLUCOSE 106* 119*  BUN 14 23  CREATININE 1.07 1.07  CALCIUM 8.7* 8.3*   GFR: Estimated Creatinine Clearance: 48.6 mL/min (by C-G formula based on SCr of 1.07 mg/dL). Liver Function Tests: Recent Labs  Lab 07/26/22 1329 07/27/22 0356  AST 26 25  ALT 14 16  ALKPHOS 60 52  BILITOT 1.4* 1.0  PROT 6.7 5.9*  ALBUMIN 3.8 3.3*   No results for input(s): "LIPASE", "AMYLASE" in the last 168 hours. No results for input(s): "AMMONIA" in the last 168 hours. Coagulation Profile: No results for input(s): "INR", "PROTIME" in the last 168 hours. Cardiac Enzymes: No results for input(s): "CKTOTAL", "CKMB", "CKMBINDEX", "TROPONINI" in the last 168 hours. BNP (last 3 results) No results for input(s): "PROBNP" in the last 8760 hours. HbA1C: No results for input(s): "HGBA1C" in the last 72 hours. CBG: Recent Labs  Lab 07/26/22 1126 07/27/22 0709  GLUCAP 113* 115*   Lipid Profile: No results for input(s): "CHOL", "HDL", "LDLCALC", "TRIG", "CHOLHDL", "LDLDIRECT" in the last 72 hours. Thyroid  Function Tests: No results for input(s): "TSH", "T4TOTAL", "FREET4", "T3FREE", "THYROIDAB" in the last 72 hours. Anemia Panel: No results for input(s): "VITAMINB12", "FOLATE", "FERRITIN", "TIBC", "IRON", "RETICCTPCT" in the last 72 hours. Sepsis Labs: No results for input(s): "PROCALCITON", "LATICACIDVEN" in the last 168 hours.  Recent Results (from the past 240 hour(s))  Resp panel by RT-PCR (RSV, Flu A&B, Covid) Anterior Nasal Swab     Status: Abnormal   Collection Time:  07/26/22  3:18 PM   Specimen: Anterior Nasal Swab  Result Value Ref Range Status   SARS Coronavirus 2 by RT PCR POSITIVE (A) NEGATIVE Final   Influenza A by PCR NEGATIVE NEGATIVE Final   Influenza B by PCR NEGATIVE NEGATIVE Final    Comment: (NOTE) The Xpert Xpress SARS-CoV-2/FLU/RSV plus assay is intended as an aid in the diagnosis of influenza from Nasopharyngeal swab specimens and should not be used as a sole basis for treatment. Nasal washings and aspirates are unacceptable for Xpert Xpress SARS-CoV-2/FLU/RSV testing.  Fact Sheet for Patients: EntrepreneurPulse.com.au  Fact Sheet for Healthcare Providers: IncredibleEmployment.be  This test is not yet approved or cleared by the [place] FDA and has been authorized for detection and/or diagnosis of SARS-CoV-2 by FDA under an Emergency Use Authorization (EUA). This EUA will remain in effect (meaning this test can be used) for the duration of the COVID-19 declaration under Section 564(b)(1) of the Act, 21 U.S.C. section 360bbb-3(b)(1), unless the authorization is terminated or revoked.     Resp Syncytial Virus by PCR NEGATIVE NEGATIVE Final    Comment: (NOTE) Fact Sheet for Patients: EntrepreneurPulse.com.au  Fact Sheet for Healthcare Providers: IncredibleEmployment.be  This test is not yet approved or cleared by the [place] FDA and has been authorized for detection and/or diagnosis of SARS-CoV-2 by FDA under an Emergency Use Authorization (EUA). This EUA will remain in effect (meaning this test can be used) for the duration of the COVID-19 declaration under Section 564(b)(1) of the Act, 21 U.S.C. section 360bbb-3(b)(1), unless the authorization is terminated or revoked.  Performed at [facility], [place] [address], [place], [place] [address]          Radiology Studies: DG Chest Portable 1 View  Result Date: 07/26/2022 CLINICAL  DATA:  COVID EXAM: PORTABLE CHEST 1 VIEW COMPARISON:  None Available. FINDINGS: Normal mediastinum and cardiac silhouette. Normal pulmonary vasculature. No evidence of effusion, infiltrate, or pneumothorax. No acute bony abnormality. RIGHT shoulder arthroplasty. IMPRESSION: No acute cardiopulmonary process. Electronically Signed   By: Suzy Bouchard M.D.   On: 07/26/2022 15:47   CT Head Wo Contrast  Result Date: 07/26/2022 CLINICAL DATA:  Fall.  COVID positive. EXAM: CT HEAD WITHOUT CONTRAST CT MAXILLOFACIAL WITHOUT CONTRAST CT CERVICAL SPINE WITHOUT CONTRAST TECHNIQUE: Multidetector CT imaging of the head, cervical spine, and maxillofacial structures were performed using the standard protocol without intravenous contrast. Multiplanar CT image reconstructions of the cervical spine and maxillofacial structures were also generated. RADIATION DOSE REDUCTION: This exam was performed according to the departmental dose-optimization program which includes automated exposure control, adjustment of the mA and/or kV according to patient size and/or use of iterative reconstruction technique. COMPARISON:  CT head dated Oct 14, 2016. FINDINGS: CT HEAD FINDINGS Brain: No evidence of acute infarction, hemorrhage, hydrocephalus, extra-axial collection or mass lesion/mass effect. Progressive atrophy and chronic microvascular ischemic change since 2018. Vascular: No hyperdense vessel or unexpected calcification. Skull: Normal. Negative for fracture or focal lesion. Other: Small left supraorbital scalp hematoma. CT MAXILLOFACIAL FINDINGS Osseous: No fracture  or mandibular dislocation. No destructive process. Orbits: Negative. No traumatic or inflammatory finding. Sinuses: Bilateral ethmoid air cell, sphenoid, and maxillary sinus mucosal thickening. No air-fluid levels. Clear mastoid air cells. Soft tissues: Negative. CT CERVICAL SPINE FINDINGS Alignment: No traumatic malalignment. Trace anterolisthesis at C7-T1 and T1-T2. Skull  base and vertebrae: No acute fracture. No primary bone lesion or focal pathologic process. Soft tissues and spinal canal: No prevertebral fluid or swelling. No visible canal hematoma. Disc levels: Advanced multilevel disc height loss and uncovertebral hypertrophy from C3-C4 through C6-C7. Advanced right-sided facet arthropathy at C7-T1. Upper chest: 8 x 6 mm solid pulmonary nodule in the left upper lobe, previously 4 x 4 mm in 2022. Other: None. IMPRESSION: 1. No acute intracranial abnormality. Small left supraorbital scalp hematoma. 2. No acute facial fracture. 3. No acute cervical spine fracture or traumatic listhesis. 4. Slowly enlarging 8 x 6 mm solid pulmonary nodule in the left upper lobe, previously 4 x 4 mm in 2022. Primary lung malignancy is not excluded. Consultation with pulmonary medicine or thoracic surgery is suggested, as clinically appropriate. Electronically Signed   By: Titus Dubin M.D.   On: 07/26/2022 13:41   CT Maxillofacial Wo Contrast  Result Date: 07/26/2022 CLINICAL DATA:  Fall.  COVID positive. EXAM: CT HEAD WITHOUT CONTRAST CT MAXILLOFACIAL WITHOUT CONTRAST CT CERVICAL SPINE WITHOUT CONTRAST TECHNIQUE: Multidetector CT imaging of the head, cervical spine, and maxillofacial structures were performed using the standard protocol without intravenous contrast. Multiplanar CT image reconstructions of the cervical spine and maxillofacial structures were also generated. RADIATION DOSE REDUCTION: This exam was performed according to the departmental dose-optimization program which includes automated exposure control, adjustment of the mA and/or kV according to patient size and/or use of iterative reconstruction technique. COMPARISON:  CT head dated Oct 14, 2016. FINDINGS: CT HEAD FINDINGS Brain: No evidence of acute infarction, hemorrhage, hydrocephalus, extra-axial collection or mass lesion/mass effect. Progressive atrophy and chronic microvascular ischemic change since 2018. Vascular: No  hyperdense vessel or unexpected calcification. Skull: Normal. Negative for fracture or focal lesion. Other: Small left supraorbital scalp hematoma. CT MAXILLOFACIAL FINDINGS Osseous: No fracture or mandibular dislocation. No destructive process. Orbits: Negative. No traumatic or inflammatory finding. Sinuses: Bilateral ethmoid air cell, sphenoid, and maxillary sinus mucosal thickening. No air-fluid levels. Clear mastoid air cells. Soft tissues: Negative. CT CERVICAL SPINE FINDINGS Alignment: No traumatic malalignment. Trace anterolisthesis at C7-T1 and T1-T2. Skull base and vertebrae: No acute fracture. No primary bone lesion or focal pathologic process. Soft tissues and spinal canal: No prevertebral fluid or swelling. No visible canal hematoma. Disc levels: Advanced multilevel disc height loss and uncovertebral hypertrophy from C3-C4 through C6-C7. Advanced right-sided facet arthropathy at C7-T1. Upper chest: 8 x 6 mm solid pulmonary nodule in the left upper lobe, previously 4 x 4 mm in 2022. Other: None. IMPRESSION: 1. No acute intracranial abnormality. Small left supraorbital scalp hematoma. 2. No acute facial fracture. 3. No acute cervical spine fracture or traumatic listhesis. 4. Slowly enlarging 8 x 6 mm solid pulmonary nodule in the left upper lobe, previously 4 x 4 mm in 2022. Primary lung malignancy is not excluded. Consultation with pulmonary medicine or thoracic surgery is suggested, as clinically appropriate. Electronically Signed   By: Titus Dubin M.D.   On: 07/26/2022 13:41   CT Cervical Spine Wo Contrast  Result Date: 07/26/2022 CLINICAL DATA:  Fall.  COVID positive. EXAM: CT HEAD WITHOUT CONTRAST CT MAXILLOFACIAL WITHOUT CONTRAST CT CERVICAL SPINE WITHOUT CONTRAST TECHNIQUE: Multidetector CT imaging of the  head, cervical spine, and maxillofacial structures were performed using the standard protocol without intravenous contrast. Multiplanar CT image reconstructions of the cervical spine and  maxillofacial structures were also generated. RADIATION DOSE REDUCTION: This exam was performed according to the departmental dose-optimization program which includes automated exposure control, adjustment of the mA and/or kV according to patient size and/or use of iterative reconstruction technique. COMPARISON:  CT head dated Oct 14, 2016. FINDINGS: CT HEAD FINDINGS Brain: No evidence of acute infarction, hemorrhage, hydrocephalus, extra-axial collection or mass lesion/mass effect. Progressive atrophy and chronic microvascular ischemic change since 2018. Vascular: No hyperdense vessel or unexpected calcification. Skull: Normal. Negative for fracture or focal lesion. Other: Small left supraorbital scalp hematoma. CT MAXILLOFACIAL FINDINGS Osseous: No fracture or mandibular dislocation. No destructive process. Orbits: Negative. No traumatic or inflammatory finding. Sinuses: Bilateral ethmoid air cell, sphenoid, and maxillary sinus mucosal thickening. No air-fluid levels. Clear mastoid air cells. Soft tissues: Negative. CT CERVICAL SPINE FINDINGS Alignment: No traumatic malalignment. Trace anterolisthesis at C7-T1 and T1-T2. Skull base and vertebrae: No acute fracture. No primary bone lesion or focal pathologic process. Soft tissues and spinal canal: No prevertebral fluid or swelling. No visible canal hematoma. Disc levels: Advanced multilevel disc height loss and uncovertebral hypertrophy from C3-C4 through C6-C7. Advanced right-sided facet arthropathy at C7-T1. Upper chest: 8 x 6 mm solid pulmonary nodule in the left upper lobe, previously 4 x 4 mm in 2022. Other: None. IMPRESSION: 1. No acute intracranial abnormality. Small left supraorbital scalp hematoma. 2. No acute facial fracture. 3. No acute cervical spine fracture or traumatic listhesis. 4. Slowly enlarging 8 x 6 mm solid pulmonary nodule in the left upper lobe, previously 4 x 4 mm in 2022. Primary lung malignancy is not excluded. Consultation with pulmonary  medicine or thoracic surgery is suggested, as clinically appropriate. Electronically Signed   By: Titus Dubin M.D.   On: 07/26/2022 13:41        Scheduled Meds:  enoxaparin (LOVENOX) injection  40 mg Subcutaneous Q24H   metoprolol tartrate  12.5 mg Oral BID   nirmatrelvir/ritonavir  3 tablet Oral BID   pravastatin  40 mg Oral q AM   sodium chloride flush  3 mL Intravenous Q12H   Continuous Infusions:   LOS: 0 days    Time spent: 52 minutes spent on chart review, discussion with nursing staff, consultants, updating family and interview/physical exam; more than 50% of that time was spent in counseling and/or coordination of care.    Arali Somera J [place] DO [facility] Available via Epic secure chat 7am-7pm After these hours, please refer to coverage provider listed on [email or web address] 07/27/2022, 11:03 AM

## 2022-07-28 DIAGNOSIS — R55 Syncope and collapse: Secondary | ICD-10-CM | POA: Diagnosis not present

## 2022-07-28 LAB — BASIC METABOLIC PANEL
Anion gap: 9 (ref 5–15)
BUN: 14 mg/dL (ref 8–23)
CO2: 22 mmol/L (ref 22–32)
Calcium: 8.6 mg/dL — ABNORMAL LOW (ref 8.9–10.3)
Chloride: 109 mmol/L (ref 98–111)
Creatinine, Ser: 0.94 mg/dL (ref 0.61–1.24)
GFR, Estimated: >60 mL/min (ref >60)
Glucose, Bld: 106 mg/dL — ABNORMAL HIGH (ref 70–99)
Potassium: 4.4 mmol/L (ref 3.5–5.1)
Sodium: 140 mmol/L (ref 135–145)

## 2022-07-28 LAB — MAGNESIUM: Magnesium: 2.1 mg/dL (ref 1.7–2.4)

## 2022-07-28 LAB — GLUCOSE, CAPILLARY: Glucose-Capillary: 100 mg/dL — ABNORMAL HIGH (ref 70–99)

## 2022-07-28 MED ORDER — NIRMATRELVIR/RITONAVIR (PAXLOVID)TABLET: 3.0000 | ORAL_TABLET | Freq: Two times a day (BID) | ORAL | 0 refills | Status: AC

## 2022-07-28 NOTE — TOC Initial Note (Signed)
Transition of Care (TOC) - Initial/Assessment Note   Spoke to patient at bedside. Confirmed face sheet information. Patient from home with wife.  Discussed home health orders , patient in agreement . Provided medicare.gov list. Patient has no preference.   Claiborne Billings with [facility] accepted referral.  Patient Details  Name: [name] MRN: [id number] Date of Birth: 03-05-1933  Transition of [facility]) CM/SW Contact:    Marilu Favre, RN Phone Number: 07/28/2022, 11:31 AM  Clinical Narrative:                   Expected Discharge Plan: [place] Barriers to Discharge: No Barriers Identified   Patient Goals and CMS Choice Patient states their goals for this hospitalization and ongoing recovery are:: to return to home CMS Medicare.gov Compare Post Acute Care list provided to:: Patient Choice offered to / list presented to : Patient      Expected Discharge Plan and Services   Discharge Planning Services: CM Consult Post Acute Care Choice: [place] arrangements for the past 2 months: Single Family Home Expected Discharge Date: 07/28/22               DME Arranged: N/A         HH Arranged: RN, PT, OT, Nurse's Aide (SW) HH Agency: [place] Date [place]: 07/28/22 Time [place]: 1129 Representative spoke with at [place]: Claiborne Billings  Prior Living Arrangements/Services Living arrangements for the past 2 months: [place] with:: Spouse Patient language and need for interpreter reviewed:: Yes Do you feel safe going back to the place where you live?: Yes      Need for Family Participation in Patient Care: Yes (Comment) Care giver support system in place?: Yes (comment)   Criminal Activity/Legal Involvement Pertinent to Current Situation/Hospitalization: No - Comment as needed  Activities of Daily Living      Permission Sought/Granted   Permission granted to share information with : No               Emotional Assessment Appearance:: Appears stated age Attitude/Demeanor/Rapport: Engaged Affect (typically observed): Accepting Orientation: : Oriented to Situation, Oriented to  Time, Oriented to Place, Oriented to Self Alcohol / Substance Use: Not Applicable Psych Involvement: No (comment)  Admission diagnosis:  Syncope and collapse [R55] COVID-19 [U07.1] Patient Active Problem List   Diagnosis Date Noted   Syncope and collapse 07/26/2022   Symptomatic bradycardia 03/18/2022   S/P shoulder replacement, right 05/13/2021   DOE (dyspnea on exertion) 10/09/2018   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   Chronic prostatitis 07/31/2017   Memory difficulty 05/15/2017   Essential hypertension 07/12/2016   Elevated TSH 04/25/2016   Fatigue 04/25/2016   Orthostatic hypotension 04/25/2016   Insomnia 04/17/2016   Chronic bronchitis ([place]) 12/28/2015   Benign non-nodular prostatic hyperplasia with lower urinary tract symptoms 11/18/2015   Restless legs 11/18/2015   Gastroesophageal reflux disease without esophagitis 08/09/2015   Abnormal TSH 08/05/2015   Anemia 08/05/2015   Hypercalcemia 08/05/2015   Hyperlipidemia 08/05/2015   Nodule of chest wall 08/05/2015   PCP:  Antony Contras, MD Pharmacy:   [facility] [place], [address] AT [place] [id number] [place] [address] Phone: [phone] Fax: [phone]  [facility] [address] [place] [address] Phone: [phone] Fax: [phone]     Social Determinants of Health (SDOH) Social  History: SDOH Screenings   Tobacco Use: Medium Risk (07/13/2022)   SDOH Interventions:     Readmission Risk Interventions     No data to display

## 2022-07-28 NOTE — Discharge Instructions (Addendum)
Referral was placed to [facility] pulmonology for noted increased pulmonary nodule on CT scan while inpatient.  Ensure to make appointment.

## 2022-07-28 NOTE — Discharge Summary (Signed)
Physician Discharge Summary  [name] [id number] DOB: 03-29-1933 DOA: 07/26/2022  PCP: Antony Contras, MD  Admit date: 07/26/2022 Discharge date: 07/28/2022  Admitted From: Home Disposition: Home with home health  Recommendations for Outpatient Follow-up:  Follow up with PCP in 1-2 weeks Ambulatory referral placed to [facility] pulmonology for follow-up regarding increased left upper lobe pulmonary nodule Will continue Paxlovid to complete 5-day course for COVID-19 viral infection on discharge Continue to encourage adequate hydration/increase oral intake given his underlying history of orthostatic hypotension.  Home Health: PT/OT/RN/aide/social work Equipment/Devices: None  Discharge Condition: Stable CODE STATUS: Full code Diet recommendation: Regular diet  History of present illness:  [name] is a 89 y.o. male with past medical history significant for essential hypertension, hyperlipidemia, symptomatic bradycardia, orthostatic hypotension, RLS, chronic bronchitis, anemia, BPH, GERD, BPPV who presented to [facility] ED on 2/14 following a syncopal episode at home.  He reports feeling unwell over the previous 2 days with associated cough, congestion and headache.  Also endorses decreased oral intake.  Patient states he was walking from the bathroom to the kitchen and unsure how he fell and does not report any prodrome.  He apparently laid on the floor for roughly 20 minutes before getting up.  Reports he tested at home for COVID and was positive, also an additional family member at home tested positive as well.  Denies chills, no chest pain, no shortness of breath, no abdominal pain, no nausea/vomiting/diarrhea.   In the ED, temperature 98.3 F, HR 66, RR 17, BP 162/74, SpO2 100% on room air.  WBC 7.8, hemoglobin 13.7, platelets 136.  Sodium 137, potassium 3.8, chloride 102, CO2 26, glucose 106, BUN 14, creatinine 1.07.  AST 26, ALT 14, total bilirubin 1.4.  Covid-19 PCR positive.   Influenza A/B/RSV PCR negative.  Chest x-ray with no acute cardiopulmonary disease findings.  CT head/C-spine/maxillofacial without contrast with no acute intracranial abnormality, small left supraorbital scalp hematoma, no acute facial fracture, no acute cervical spine fracture or traumatic listhesis, but notable for slowly enlarging 8 x 6 mm solid pulmonary nodule left upper lobe.  Patient received Toradol, Compazine, Benadryl for headache in the ED.  Started on Paxlovid.  TRH consulted for admission for further evaluation and management of syncope in the setting of COVID-19 viral infection and poor oral intake.  Hospital course:  Syncope and collapse Hx orthostatic hypotension/symptomatic bradycardia Patient presenting to ED after an episode of syncope at home, denies prodrome and occurred while walking from the bathroom to the kitchen. Roughly 20 minutes loss of consciousness.  CT head/C-spine/maxillofacial without contrast with no acute intracranial or facial findings other than scalp hematoma.  Etiology likely secondary to history of orthostatic hypotension in the setting of decreased oral intake with acute COVID-19 viral infection.  TTE with LVEF 70 to [id number], grade 1 diastolic dysfunction, LA mildly dilated, trivial MR, no aortic stenosis, ascending aortic dilation of 41 mm.  Patient was supported with IV fluid hydration with improvement of symptoms.  Discussed with patient needs to maintain adequate hydration with orthostatic precautions given.  No concerning arrhythmias noted on telemetry during his hospitalization.  Outpatient follow-up with PCP and cardiology.   COVID-19 viral infection Patient asymptomatic, not requiring oxygen.  Will continue Paxlovid for 5-day course.   Pulmonary Nodule CT on admission notable for slowly enlarging solid pulmonary nodule left upper lobe measuring 8 x 6 mm.  Patient has followed with pulmonology in the past, Dr. Alcide Clever in [place].  Patient requesting to see  pulmonology  in [place], ambulatory referral placed.     Essential hypertension Patient previously on amlodipine, carvedilol, losartan which has been discontinued. Continue metoprolol tartrate 12.5 mg p.o. twice daily   Hyperlipidemia Continue pravastatin 40 mg p.o. daily   Restless leg syndrome Continue ropinirole 0.5 mg nightly as needed for restless legs   BPH Continue tamsulosin.  But if recurrent issues of dizziness, may need to consider discontinuing this medication outpatient.  Discharge Diagnoses:  Principal Problem:   Syncope and collapse Active Problems:   Essential hypertension   Hyperlipidemia   Chronic bronchitis (HCC)   Benign non-nodular prostatic hyperplasia with lower urinary tract symptoms   Orthostatic hypotension   Restless legs    Discharge Instructions  Discharge Instructions     Ambulatory referral to Pulmonology   Complete by: As directed    Reason for referral: Lung Mass/Lung Nodule   Call MD for:  difficulty breathing, headache or visual disturbances   Complete by: As directed    Call MD for:  extreme fatigue   Complete by: As directed    Call MD for:  persistant dizziness or light-headedness   Complete by: As directed    Call MD for:  persistant nausea and vomiting   Complete by: As directed    Call MD for:  severe uncontrolled pain   Complete by: As directed    Call MD for:  temperature >100.4   Complete by: As directed    Diet - low sodium heart healthy   Complete by: As directed    Increase activity slowly   Complete by: As directed    No wound care   Complete by: As directed       Allergies as of 07/28/2022       Reactions   Codeine Nausea And Vomiting   Penicillins Other (See Comments)   Pt does not remember reaction        Medication List     TAKE these medications    acetaminophen 500 MG tablet Commonly known as: TYLENOL Take 1,000 mg by mouth 2 (two) times daily as needed for headache, mild pain or moderate  pain (for pain).   Centrum Silver 50+Men Tabs Take 1 tablet by mouth daily.   metoprolol tartrate 25 MG tablet Commonly known as: LOPRESSOR Take 0.5 tablets (12.5 mg total) by mouth 2 (two) times daily.   nirmatrelvir/ritonavir 20 x 150 MG & 10 x 100MG Tabs Commonly known as: PAXLOVID Take 3 tablets by mouth 2 (two) times daily for 5 days. Patient GFR is >60. Take nirmatrelvir (150 mg) two tablets twice daily for 5 days and ritonavir (100 mg) one tablet twice daily for 5 days.   pravastatin 40 MG tablet Commonly known as: PRAVACHOL Take 40 mg by mouth in the morning.   rOPINIRole 0.5 MG tablet Commonly known as: REQUIP Take 0.5 mg by mouth at bedtime as needed (restless legs).   tamsulosin 0.4 MG Caps capsule Commonly known as: FLOMAX Take 0.4 mg by mouth daily as needed (prostate health).        Follow-up Information     Antony Contras, MD. Schedule an appointment as soon as possible for a visit in 1 week(s).   Specialty: Family Medicine Contact information: [place] [id number] [facility]. Schedule an appointment as soon as possible for a visit.   Specialty: Pulmonology Contact information: [place]  [place] [id number] [phone]               Allergies  Allergen Reactions   Codeine Nausea And Vomiting   Penicillins Other (See Comments)    Pt does not remember reaction    Consultations: None   Procedures/Studies: ECHOCARDIOGRAM COMPLETE  Result Date: 07/27/2022    ECHOCARDIOGRAM REPORT   Patient Name:   RODRIGO PEZZULLO Date of Exam: 07/27/2022 Medical Rec #:  [id number]    Height:       69.0 in Accession #:    [id number]   Weight:       168.2 lb Date of Birth:  Mar 16, 1933    BSA:          1.919 m Patient Age:    89 years     BP:           126/68 mmHg Patient Gender: M            HR:           63 bpm. Exam Location:  Inpatient  Procedure: 2D Echo, Cardiac Doppler and Color Doppler Indications:    Syncope R55  History:        Patient has prior history of Echocardiogram examinations, most                 recent 03/19/2022. Signs/Symptoms:Syncope; Risk                 Factors:Hypertension, Dyslipidemia, Former Smoker and Sleep                 Apnea.  Sonographer:    Greer Pickerel Referring Phys: [id number] Candace Gallus MELVIN  Sonographer Comments: Suboptimal parasternal window. Image acquisition challenging due to patient body habitus and Image acquisition challenging due to respiratory motion. IMPRESSIONS  1. Left ventricular ejection fraction, by estimation, is 70 to 75%. The left ventricle has hyperdynamic function. The left ventricle has no regional wall motion abnormalities. Left ventricular diastolic parameters are consistent with Grade I diastolic dysfunction (impaired relaxation). Elevated left atrial pressure.  2. Right ventricular systolic function is normal. The right ventricular size is normal. There is normal pulmonary artery systolic pressure.  3. Left atrial size was mildly dilated.  4. Trivial mitral valve regurgitation.  5. The aortic valve is tricuspid. Aortic valve regurgitation is trivial. Aortic valve sclerosis/calcification is present, without any evidence of aortic stenosis.  6. Aortic dilatation noted. There is mild dilatation of the ascending aorta, measuring 41 mm. FINDINGS  Left Ventricle: Left ventricular ejection fraction, by estimation, is 70 to 75%. The left ventricle has hyperdynamic function. The left ventricle has no regional wall motion abnormalities. The left ventricular internal cavity size was normal in size. Left ventricular diastolic parameters are consistent with Grade I diastolic dysfunction (impaired relaxation). Elevated left atrial pressure. Right Ventricle: The right ventricular size is normal. Right vetricular wall thickness was not assessed. Right ventricular systolic function is normal. There is  normal pulmonary artery systolic pressure. The tricuspid regurgitant velocity is 1.25 m/s, and with an assumed right atrial pressure of 15 mmHg, the estimated right ventricular systolic pressure is 0000000 mmHg. Left Atrium: Left atrial size was mildly dilated. Right Atrium: Right atrial size was normal in size. Pericardium: There is no evidence of pericardial effusion. Mitral Valve: There is mild thickening of the mitral valve leaflet(s). Mild mitral annular calcification. Trivial mitral valve regurgitation. Tricuspid Valve: The tricuspid valve is normal in structure. Tricuspid valve regurgitation is trivial. Aortic Valve:  The aortic valve is tricuspid. Aortic valve regurgitation is trivial. Aortic regurgitation PHT measures 705 msec. Aortic valve sclerosis/calcification is present, without any evidence of aortic stenosis. Pulmonic Valve: The pulmonic valve was normal in structure. Pulmonic valve regurgitation is not visualized. Aorta: The aortic root is normal in size and structure and aortic dilatation noted. There is mild dilatation of the ascending aorta, measuring 41 mm. IAS/Shunts: No atrial level shunt detected by color flow Doppler.  LEFT VENTRICLE PLAX 2D LVOT diam:     2.00 cm     Diastology LV SV:         83          LV e' medial:    4.68 cm/s LV SV Index:   43          LV E/e' medial:  17.5 LVOT Area:     3.14 cm    LV e' lateral:   5.11 cm/s                            LV E/e' lateral: 16.0  LV Volumes (MOD) LV vol d, MOD A2C: 43.7 ml LV vol d, MOD A4C: 48.3 ml LV vol s, MOD A2C: 16.7 ml LV vol s, MOD A4C: 17.2 ml LV SV MOD A2C:     27.0 ml LV SV MOD A4C:     48.3 ml LV SV MOD BP:      31.1 ml RIGHT VENTRICLE RV S prime:     11.50 cm/s TAPSE (M-mode): 1.9 cm LEFT ATRIUM             Index        RIGHT ATRIUM           Index LA diam:        5.00 cm 2.60 cm/m   RA Area:     19.20 cm LA Vol (A2C):   48.8 ml 25.42 ml/m  RA Volume:   54.30 ml  28.29 ml/m LA Vol (A4C):   67.1 ml 34.96 ml/m LA Biplane Vol:  60.2 ml 31.36 ml/m  AORTIC VALVE LVOT Vmax:   119.00 cm/s LVOT Vmean:  76.000 cm/s LVOT VTI:    0.264 m AI PHT:      705 msec  AORTA Ao Root diam: 3.90 cm Ao Asc diam:  4.00 cm MITRAL VALVE               TRICUSPID VALVE MV Area (PHT): 2.36 cm    TR Peak grad:   6.2 mmHg MV Decel Time: 322 msec    TR Vmax:        125.00 cm/s MV E velocity: 81.80 cm/s MV A velocity: 91.70 cm/s  SHUNTS MV E/A ratio:  0.89        Systemic VTI:  0.26 m                            Systemic Diam: 2.00 cm Dorris Carnes MD Electronically signed by Dorris Carnes MD Signature Date/Time: 07/27/2022/3:05:17 PM    Final    DG Chest Portable 1 View  Result Date: 07/26/2022 CLINICAL DATA:  COVID EXAM: PORTABLE CHEST 1 VIEW COMPARISON:  None Available. FINDINGS: Normal mediastinum and cardiac silhouette. Normal pulmonary vasculature. No evidence of effusion, infiltrate, or pneumothorax. No acute bony abnormality. RIGHT shoulder arthroplasty. IMPRESSION: No acute cardiopulmonary process. Electronically Signed   By: Helane Gunther.D.  On: 07/26/2022 15:47   CT Head Wo Contrast  Result Date: 07/26/2022 CLINICAL DATA:  Fall.  COVID positive. EXAM: CT HEAD WITHOUT CONTRAST CT MAXILLOFACIAL WITHOUT CONTRAST CT CERVICAL SPINE WITHOUT CONTRAST TECHNIQUE: Multidetector CT imaging of the head, cervical spine, and maxillofacial structures were performed using the standard protocol without intravenous contrast. Multiplanar CT image reconstructions of the cervical spine and maxillofacial structures were also generated. RADIATION DOSE REDUCTION: This exam was performed according to the departmental dose-optimization program which includes automated exposure control, adjustment of the mA and/or kV according to patient size and/or use of iterative reconstruction technique. COMPARISON:  CT head dated Oct 14, 2016. FINDINGS: CT HEAD FINDINGS Brain: No evidence of acute infarction, hemorrhage, hydrocephalus, extra-axial collection or mass lesion/mass effect.  Progressive atrophy and chronic microvascular ischemic change since 2018. Vascular: No hyperdense vessel or unexpected calcification. Skull: Normal. Negative for fracture or focal lesion. Other: Small left supraorbital scalp hematoma. CT MAXILLOFACIAL FINDINGS Osseous: No fracture or mandibular dislocation. No destructive process. Orbits: Negative. No traumatic or inflammatory finding. Sinuses: Bilateral ethmoid air cell, sphenoid, and maxillary sinus mucosal thickening. No air-fluid levels. Clear mastoid air cells. Soft tissues: Negative. CT CERVICAL SPINE FINDINGS Alignment: No traumatic malalignment. Trace anterolisthesis at C7-T1 and T1-T2. Skull base and vertebrae: No acute fracture. No primary bone lesion or focal pathologic process. Soft tissues and spinal canal: No prevertebral fluid or swelling. No visible canal hematoma. Disc levels: Advanced multilevel disc height loss and uncovertebral hypertrophy from C3-C4 through C6-C7. Advanced right-sided facet arthropathy at C7-T1. Upper chest: 8 x 6 mm solid pulmonary nodule in the left upper lobe, previously 4 x 4 mm in 2022. Other: None. IMPRESSION: 1. No acute intracranial abnormality. Small left supraorbital scalp hematoma. 2. No acute facial fracture. 3. No acute cervical spine fracture or traumatic listhesis. 4. Slowly enlarging 8 x 6 mm solid pulmonary nodule in the left upper lobe, previously 4 x 4 mm in 2022. Primary lung malignancy is not excluded. Consultation with pulmonary medicine or thoracic surgery is suggested, as clinically appropriate. Electronically Signed   By: Titus Dubin M.D.   On: 07/26/2022 13:41   CT Maxillofacial Wo Contrast  Result Date: 07/26/2022 CLINICAL DATA:  Fall.  COVID positive. EXAM: CT HEAD WITHOUT CONTRAST CT MAXILLOFACIAL WITHOUT CONTRAST CT CERVICAL SPINE WITHOUT CONTRAST TECHNIQUE: Multidetector CT imaging of the head, cervical spine, and maxillofacial structures were performed using the standard protocol without  intravenous contrast. Multiplanar CT image reconstructions of the cervical spine and maxillofacial structures were also generated. RADIATION DOSE REDUCTION: This exam was performed according to the departmental dose-optimization program which includes automated exposure control, adjustment of the mA and/or kV according to patient size and/or use of iterative reconstruction technique. COMPARISON:  CT head dated Oct 14, 2016. FINDINGS: CT HEAD FINDINGS Brain: No evidence of acute infarction, hemorrhage, hydrocephalus, extra-axial collection or mass lesion/mass effect. Progressive atrophy and chronic microvascular ischemic change since 2018. Vascular: No hyperdense vessel or unexpected calcification. Skull: Normal. Negative for fracture or focal lesion. Other: Small left supraorbital scalp hematoma. CT MAXILLOFACIAL FINDINGS Osseous: No fracture or mandibular dislocation. No destructive process. Orbits: Negative. No traumatic or inflammatory finding. Sinuses: Bilateral ethmoid air cell, sphenoid, and maxillary sinus mucosal thickening. No air-fluid levels. Clear mastoid air cells. Soft tissues: Negative. CT CERVICAL SPINE FINDINGS Alignment: No traumatic malalignment. Trace anterolisthesis at C7-T1 and T1-T2. Skull base and vertebrae: No acute fracture. No primary bone lesion or focal pathologic process. Soft tissues and spinal canal: No prevertebral fluid or  swelling. No visible canal hematoma. Disc levels: Advanced multilevel disc height loss and uncovertebral hypertrophy from C3-C4 through C6-C7. Advanced right-sided facet arthropathy at C7-T1. Upper chest: 8 x 6 mm solid pulmonary nodule in the left upper lobe, previously 4 x 4 mm in 2022. Other: None. IMPRESSION: 1. No acute intracranial abnormality. Small left supraorbital scalp hematoma. 2. No acute facial fracture. 3. No acute cervical spine fracture or traumatic listhesis. 4. Slowly enlarging 8 x 6 mm solid pulmonary nodule in the left upper lobe, previously 4  x 4 mm in 2022. Primary lung malignancy is not excluded. Consultation with pulmonary medicine or thoracic surgery is suggested, as clinically appropriate. Electronically Signed   By: Titus Dubin M.D.   On: 07/26/2022 13:41   CT Cervical Spine Wo Contrast  Result Date: 07/26/2022 CLINICAL DATA:  Fall.  COVID positive. EXAM: CT HEAD WITHOUT CONTRAST CT MAXILLOFACIAL WITHOUT CONTRAST CT CERVICAL SPINE WITHOUT CONTRAST TECHNIQUE: Multidetector CT imaging of the head, cervical spine, and maxillofacial structures were performed using the standard protocol without intravenous contrast. Multiplanar CT image reconstructions of the cervical spine and maxillofacial structures were also generated. RADIATION DOSE REDUCTION: This exam was performed according to the departmental dose-optimization program which includes automated exposure control, adjustment of the mA and/or kV according to patient size and/or use of iterative reconstruction technique. COMPARISON:  CT head dated Oct 14, 2016. FINDINGS: CT HEAD FINDINGS Brain: No evidence of acute infarction, hemorrhage, hydrocephalus, extra-axial collection or mass lesion/mass effect. Progressive atrophy and chronic microvascular ischemic change since 2018. Vascular: No hyperdense vessel or unexpected calcification. Skull: Normal. Negative for fracture or focal lesion. Other: Small left supraorbital scalp hematoma. CT MAXILLOFACIAL FINDINGS Osseous: No fracture or mandibular dislocation. No destructive process. Orbits: Negative. No traumatic or inflammatory finding. Sinuses: Bilateral ethmoid air cell, sphenoid, and maxillary sinus mucosal thickening. No air-fluid levels. Clear mastoid air cells. Soft tissues: Negative. CT CERVICAL SPINE FINDINGS Alignment: No traumatic malalignment. Trace anterolisthesis at C7-T1 and T1-T2. Skull base and vertebrae: No acute fracture. No primary bone lesion or focal pathologic process. Soft tissues and spinal canal: No prevertebral fluid or  swelling. No visible canal hematoma. Disc levels: Advanced multilevel disc height loss and uncovertebral hypertrophy from C3-C4 through C6-C7. Advanced right-sided facet arthropathy at C7-T1. Upper chest: 8 x 6 mm solid pulmonary nodule in the left upper lobe, previously 4 x 4 mm in 2022. Other: None. IMPRESSION: 1. No acute intracranial abnormality. Small left supraorbital scalp hematoma. 2. No acute facial fracture. 3. No acute cervical spine fracture or traumatic listhesis. 4. Slowly enlarging 8 x 6 mm solid pulmonary nodule in the left upper lobe, previously 4 x 4 mm in 2022. Primary lung malignancy is not excluded. Consultation with pulmonary medicine or thoracic surgery is suggested, as clinically appropriate. Electronically Signed   By: Titus Dubin M.D.   On: 07/26/2022 13:41     Subjective: Patient seen examined at bedside, resting calmly.  Sitting in bedside chair.  Patient with no complaints this morning.  Patient denied any dizziness while standing doing orthostatic vital signs this morning.  Updated patient's son via telephone.  Discussed incidental finding of enlarged left upper lobe pulmonary nodule and recommendations of outpatient follow-up with pulmonology.  Also discussed need to continue increased oral intake to maintain hydration is likely precipitating factor to his admission.  Patient request to be seen by pulmonology in [place].  No other specific complaints or concerns at this time.  Denies headache, no visual changes, no chest pain,  no palpitations, no dizziness, no fever/chills/night sweats, no nausea/vomiting/diarrhea, no abdominal pain, no focal weakness, no fatigue, no paresthesias.  No acute events overnight per nursing staff.  Discharge Exam: Vitals:   07/27/22 2007 07/28/22 0846  BP: (!) 145/85 (!) 162/89  Pulse: 62 63  Resp:  17  Temp: 98 F (36.7 C) 97.7 F (36.5 C)  SpO2: 97% 97%   Vitals:   07/27/22 1511 07/27/22 2007 07/28/22 0500 07/28/22 0846  BP: (!)  159/72 (!) 145/85  (!) 162/89  Pulse: 61 62  63  Resp: 18   17  Temp: 97.8 F (36.6 C) 98 F (36.7 C)  97.7 F (36.5 C)  TempSrc: Oral Oral  Oral  SpO2: 98% 97%  97%  Weight:   73.7 kg     Physical Exam: GEN: NAD, alert and oriented x 3, elderly in appearance HEENT: NCAT, PERRL, EOMI, sclera clear, MMM, noted left eyebrow abrasion with surrounding ecchymosis, left orbital ecchymosis PULM: CTAB w/o wheezes/crackles, normal respiratory effort, on room air CV: RRR w/o M/G/R GI: abd soft, NTND, NABS, no R/G/M MSK: no peripheral edema, moves all extremities independently NEURO: CN II-XII intact, no focal deficits, sensation to light touch intact PSYCH: normal mood/affect Integumentary: Left ocular orbital ecchymosis, left eyebrow abrasion/ecchymosis noted, left upper extremity ecchymosis noted, otherwise no other concerning rashes/lesions/wounds noted on exposed skin surfaces     The results of significant diagnostics from this hospitalization (including imaging, microbiology, ancillary and laboratory) are listed below for reference.     Microbiology: Recent Results (from the past 240 hour(s))  Resp panel by RT-PCR (RSV, Flu A&B, Covid) Anterior Nasal Swab     Status: Abnormal   Collection Time: 07/26/22  3:18 PM   Specimen: Anterior Nasal Swab  Result Value Ref Range Status   SARS Coronavirus 2 by RT PCR POSITIVE (A) NEGATIVE Final   Influenza A by PCR NEGATIVE NEGATIVE Final   Influenza B by PCR NEGATIVE NEGATIVE Final    Comment: (NOTE) The Xpert Xpress SARS-CoV-2/FLU/RSV plus assay is intended as an aid in the diagnosis of influenza from Nasopharyngeal swab specimens and should not be used as a sole basis for treatment. Nasal washings and aspirates are unacceptable for Xpert Xpress SARS-CoV-2/FLU/RSV testing.  Fact Sheet for Patients: EntrepreneurPulse.com.au  Fact Sheet for Healthcare Providers: IncredibleEmployment.be  This test is  not yet approved or cleared by the [place] FDA and has been authorized for detection and/or diagnosis of SARS-CoV-2 by FDA under an Emergency Use Authorization (EUA). This EUA will remain in effect (meaning this test can be used) for the duration of the COVID-19 declaration under Section 564(b)(1) of the Act, 21 U.S.C. section 360bbb-3(b)(1), unless the authorization is terminated or revoked.     Resp Syncytial Virus by PCR NEGATIVE NEGATIVE Final    Comment: (NOTE) Fact Sheet for Patients: EntrepreneurPulse.com.au  Fact Sheet for Healthcare Providers: IncredibleEmployment.be  This test is not yet approved or cleared by the [place] FDA and has been authorized for detection and/or diagnosis of SARS-CoV-2 by FDA under an Emergency Use Authorization (EUA). This EUA will remain in effect (meaning this test can be used) for the duration of the COVID-19 declaration under Section 564(b)(1) of the Act, 21 U.S.C. section 360bbb-3(b)(1), unless the authorization is terminated or revoked.  Performed at [facility], [place] [address], [place], [place] [address]      Labs: BNP (last 3 results) No results for input(s): "BNP" in the last 8760 hours. Basic Metabolic Panel:  Recent Labs  Lab 07/26/22 1329 07/27/22 0356 07/28/22 0412  NA 137 134* 140  K 3.8 3.6 4.4  CL 102 103 109  CO2 26 20* 22  GLUCOSE 106* 119* 106*  BUN 14 23 14  $ CREATININE 1.07 1.07 0.94  CALCIUM 8.7* 8.3* 8.6*  MG  --   --  2.1   Liver Function Tests: Recent Labs  Lab 07/26/22 1329 07/27/22 0356  AST 26 25  ALT 14 16  ALKPHOS 60 52  BILITOT 1.4* 1.0  PROT 6.7 5.9*  ALBUMIN 3.8 3.3*   No results for input(s): "LIPASE", "AMYLASE" in the last 168 hours. No results for input(s): "AMMONIA" in the last 168 hours. CBC: Recent Labs  Lab 07/26/22 1044 07/27/22 0356  WBC 7.8 6.8  HGB 13.7 12.7*  HCT 39.7 34.9*  MCV 95.7 92.8  PLT 136* 122*   Cardiac  Enzymes: No results for input(s): "CKTOTAL", "CKMB", "CKMBINDEX", "TROPONINI" in the last 168 hours. BNP: Invalid input(s): "POCBNP" CBG: Recent Labs  Lab 07/26/22 1126 07/27/22 0709 07/28/22 0504  GLUCAP 113* 115* 100*   D-Dimer No results for input(s): "DDIMER" in the last 72 hours. Hgb A1c No results for input(s): "HGBA1C" in the last 72 hours. Lipid Profile No results for input(s): "CHOL", "HDL", "LDLCALC", "TRIG", "CHOLHDL", "LDLDIRECT" in the last 72 hours. Thyroid function studies No results for input(s): "TSH", "T4TOTAL", "T3FREE", "THYROIDAB" in the last 72 hours.  Invalid input(s): "FREET3" Anemia work up No results for input(s): "VITAMINB12", "FOLATE", "FERRITIN", "TIBC", "IRON", "RETICCTPCT" in the last 72 hours. Urinalysis No results found for: "COLORURINE", "APPEARANCEUR", "LABSPEC", "PHURINE", "GLUCOSEU", "HGBUR", "BILIRUBINUR", "KETONESUR", "PROTEINUR", "UROBILINOGEN", "NITRITE", "LEUKOCYTESUR" Sepsis Labs Recent Labs  Lab 07/26/22 1044 07/27/22 0356  WBC 7.8 6.8   Microbiology Recent Results (from the past 240 hour(s))  Resp panel by RT-PCR (RSV, Flu A&B, Covid) Anterior Nasal Swab     Status: Abnormal   Collection Time: 07/26/22  3:18 PM   Specimen: Anterior Nasal Swab  Result Value Ref Range Status   SARS Coronavirus 2 by RT PCR POSITIVE (A) NEGATIVE Final   Influenza A by PCR NEGATIVE NEGATIVE Final   Influenza B by PCR NEGATIVE NEGATIVE Final    Comment: (NOTE) The Xpert Xpress SARS-CoV-2/FLU/RSV plus assay is intended as an aid in the diagnosis of influenza from Nasopharyngeal swab specimens and should not be used as a sole basis for treatment. Nasal washings and aspirates are unacceptable for Xpert Xpress SARS-CoV-2/FLU/RSV testing.  Fact Sheet for Patients: EntrepreneurPulse.com.au  Fact Sheet for Healthcare Providers: IncredibleEmployment.be  This test is not yet approved or cleared by the [place]  FDA and has been authorized for detection and/or diagnosis of SARS-CoV-2 by FDA under an Emergency Use Authorization (EUA). This EUA will remain in effect (meaning this test can be used) for the duration of the COVID-19 declaration under Section 564(b)(1) of the Act, 21 U.S.C. section 360bbb-3(b)(1), unless the authorization is terminated or revoked.     Resp Syncytial Virus by PCR NEGATIVE NEGATIVE Final    Comment: (NOTE) Fact Sheet for Patients: EntrepreneurPulse.com.au  Fact Sheet for Healthcare Providers: IncredibleEmployment.be  This test is not yet approved or cleared by the [place] FDA and has been authorized for detection and/or diagnosis of SARS-CoV-2 by FDA under an Emergency Use Authorization (EUA). This EUA will remain in effect (meaning this test can be used) for the duration of the COVID-19 declaration under Section 564(b)(1) of the Act, 21 U.S.C. section 360bbb-3(b)(1), unless the authorization is terminated or  revoked.  Performed at [facility], [place] [address] [place], [place] [address]      Time coordinating discharge: Over 30 minutes  SIGNED:   Annamarie Yamaguchi J [place] DO  [facility] 07/28/2022, 10:24 AM

## 2022-07-28 NOTE — Plan of Care (Signed)
  Problem: Education: Goal: Knowledge of condition and prescribed therapy will improve 07/28/2022 1113 by Santa Lighter, RN Outcome: Adequate for Discharge 07/28/2022 0911 by Santa Lighter, RN Outcome: Progressing   Problem: Cardiac: Goal: Will achieve and/or maintain adequate cardiac output Outcome: Adequate for Discharge   Problem: Physical Regulation: Goal: Complications related to the disease process, condition or treatment will be avoided or minimized Outcome: Adequate for Discharge   Problem: Education: Goal: Knowledge of General Education information will improve Description: Including pain rating scale, medication(s)/side effects and non-pharmacologic comfort measures 07/28/2022 1113 by Santa Lighter, RN Outcome: Adequate for Discharge 07/28/2022 0911 by Santa Lighter, RN Outcome: Progressing   Problem: Health Behavior/Discharge Planning: Goal: Ability to manage health-related needs will improve Outcome: Adequate for Discharge   Problem: Clinical Measurements: Goal: Ability to maintain clinical measurements within normal limits will improve Outcome: Adequate for Discharge Goal: Will remain free from infection Outcome: Adequate for Discharge Goal: Diagnostic test results will improve Outcome: Adequate for Discharge Goal: Respiratory complications will improve Outcome: Adequate for Discharge Goal: Cardiovascular complication will be avoided Outcome: Adequate for Discharge   Problem: Activity: Goal: Risk for activity intolerance will decrease Outcome: Adequate for Discharge   Problem: Nutrition: Goal: Adequate nutrition will be maintained Outcome: Adequate for Discharge   Problem: Coping: Goal: Level of anxiety will decrease Outcome: Adequate for Discharge   Problem: Elimination: Goal: Will not experience complications related to bowel motility Outcome: Adequate for Discharge Goal: Will not experience complications related to urinary retention Outcome:  Adequate for Discharge   Problem: Pain Managment: Goal: General experience of comfort will improve Outcome: Adequate for Discharge   Problem: Safety: Goal: Ability to remain free from injury will improve Outcome: Adequate for Discharge   Problem: Skin Integrity: Goal: Risk for impaired skin integrity will decrease Outcome: Adequate for Discharge   Problem: Respiratory: Goal: Will maintain a patent airway 07/28/2022 1113 by Santa Lighter, RN Outcome: Adequate for Discharge 07/28/2022 0912 by Santa Lighter, RN Outcome: Progressing Goal: Complications related to the disease process, condition or treatment will be avoided or minimized 07/28/2022 1113 by Santa Lighter, RN Outcome: Adequate for Discharge 07/28/2022 0912 by Santa Lighter, RN Outcome: Progressing

## 2022-07-28 NOTE — Progress Notes (Signed)
Pt reports that one of his hearing aides are missing. Pt's bed and room searched, no hearing aide found. One hearing aide noted in charger, pt states he had both when he laid down in bed yesterday. Unable to recall the last time he actually had in his ear. Will relay to oncoming shift for followup for missing hearing aide.

## 2022-07-28 NOTE — Plan of Care (Signed)
  Problem: Education: Goal: Knowledge of condition and prescribed therapy will improve Outcome: Progressing   Problem: Education: Goal: Knowledge of General Education information will improve Description: Including pain rating scale, medication(s)/side effects and non-pharmacologic comfort measures Outcome: Progressing   Problem: Respiratory: Goal: Will maintain a patent airway Outcome: Progressing Goal: Complications related to the disease process, condition or treatment will be avoided or minimized Outcome: Progressing

## 2022-07-28 NOTE — Progress Notes (Signed)
Removed IV-CDI. Reviewed d/c paperwork with patient and son. Answered questions. Sent patient with remaining doses of paxlovid. Wheeled stable patient and belongings to main entrance where he was picked up by his son.  Patient stated that he lost his left hearing aid. I searched the floor, bed, bathroom, patient belongings, and dirty linen but I could not find it.

## 2022-07-30 DIAGNOSIS — I951 Orthostatic hypotension: Secondary | ICD-10-CM | POA: Diagnosis not present

## 2022-07-30 DIAGNOSIS — N411 Chronic prostatitis: Secondary | ICD-10-CM | POA: Diagnosis not present

## 2022-07-30 DIAGNOSIS — N400 Enlarged prostate without lower urinary tract symptoms: Secondary | ICD-10-CM | POA: Diagnosis not present

## 2022-07-30 DIAGNOSIS — J42 Unspecified chronic bronchitis: Secondary | ICD-10-CM | POA: Diagnosis not present

## 2022-07-30 DIAGNOSIS — R911 Solitary pulmonary nodule: Secondary | ICD-10-CM | POA: Diagnosis not present

## 2022-07-30 DIAGNOSIS — G4700 Insomnia, unspecified: Secondary | ICD-10-CM | POA: Diagnosis not present

## 2022-07-30 DIAGNOSIS — G2581 Restless legs syndrome: Secondary | ICD-10-CM | POA: Diagnosis not present

## 2022-07-30 DIAGNOSIS — U071 COVID-19: Secondary | ICD-10-CM | POA: Diagnosis not present

## 2022-07-30 DIAGNOSIS — Z955 Presence of coronary angioplasty implant and graft: Secondary | ICD-10-CM | POA: Diagnosis not present

## 2022-07-30 DIAGNOSIS — H8110 Benign paroxysmal vertigo, unspecified ear: Secondary | ICD-10-CM | POA: Diagnosis not present

## 2022-07-30 DIAGNOSIS — J438 Other emphysema: Secondary | ICD-10-CM | POA: Diagnosis not present

## 2022-07-30 DIAGNOSIS — I10 Essential (primary) hypertension: Secondary | ICD-10-CM | POA: Diagnosis not present

## 2022-07-30 DIAGNOSIS — K219 Gastro-esophageal reflux disease without esophagitis: Secondary | ICD-10-CM | POA: Diagnosis not present

## 2022-07-30 DIAGNOSIS — E785 Hyperlipidemia, unspecified: Secondary | ICD-10-CM | POA: Diagnosis not present

## 2022-07-30 DIAGNOSIS — D649 Anemia, unspecified: Secondary | ICD-10-CM | POA: Diagnosis not present

## 2022-07-30 DIAGNOSIS — Z96611 Presence of right artificial shoulder joint: Secondary | ICD-10-CM | POA: Diagnosis not present

## 2022-07-31 DIAGNOSIS — J438 Other emphysema: Secondary | ICD-10-CM | POA: Diagnosis not present

## 2022-07-31 DIAGNOSIS — I10 Essential (primary) hypertension: Secondary | ICD-10-CM | POA: Diagnosis not present

## 2022-07-31 DIAGNOSIS — N411 Chronic prostatitis: Secondary | ICD-10-CM | POA: Diagnosis not present

## 2022-07-31 DIAGNOSIS — G2581 Restless legs syndrome: Secondary | ICD-10-CM | POA: Diagnosis not present

## 2022-07-31 DIAGNOSIS — U071 COVID-19: Secondary | ICD-10-CM | POA: Diagnosis not present

## 2022-07-31 DIAGNOSIS — J42 Unspecified chronic bronchitis: Secondary | ICD-10-CM | POA: Diagnosis not present

## 2022-08-02 DIAGNOSIS — N411 Chronic prostatitis: Secondary | ICD-10-CM | POA: Diagnosis not present

## 2022-08-02 DIAGNOSIS — J42 Unspecified chronic bronchitis: Secondary | ICD-10-CM | POA: Diagnosis not present

## 2022-08-02 DIAGNOSIS — U071 COVID-19: Secondary | ICD-10-CM | POA: Diagnosis not present

## 2022-08-02 DIAGNOSIS — G2581 Restless legs syndrome: Secondary | ICD-10-CM | POA: Diagnosis not present

## 2022-08-02 DIAGNOSIS — I10 Essential (primary) hypertension: Secondary | ICD-10-CM | POA: Diagnosis not present

## 2022-08-02 DIAGNOSIS — J438 Other emphysema: Secondary | ICD-10-CM | POA: Diagnosis not present

## 2022-08-03 NOTE — Progress Notes (Signed)
Synopsis: Referred in Feb 2024 for lung nodule by [place] Eric J, DO  Subjective:   PATIENT ID: [name] GENDER: male DOB: 1933/06/12, MRN: [id number]  Chief Complaint  Patient presents with   Consult    Pulm. nodule.    89 yo M, former smoker, quit 60 years ago, two brothers with lung cancer. HTN HLD. Recently had covid. Was told that he had asthma at one time. He has OSA and was suppose to be using a CPAP machine but stopped.  Unfortunately he had a fall at home recently went to the emergency room had a CT scan of the head and cervical spine.  This revealed a left upper lobe pulmonary nodule that was present in 2022.  It showed growth from 4 mm now to 8 mm in largest cross-section.  Here today to talk about next best steps.    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)    Hypocalcemia    Memory difficulty 05/15/2017   Memory loss    Orthostatic dizziness    Polymyalgia rheumatica (HCC)    Prostatic hyperplasia    Restless leg syndrome    Skin cancer, basal cell    Sleep apnea    Transient global amnesia      Family History  Problem Relation Age of Onset   CVA Mother    Hypertension Mother    Cancer - Prostate Father    Cancer - Lung Brother    Cancer - Lung Brother      Past Surgical History:  Procedure Laterality Date   BACK SURGERY  1980   CARDIAC CATHETERIZATION     REVERSE SHOULDER ARTHROPLASTY Right 05/13/2021   Procedure: REVERSE SHOULDER ARTHROPLASTY;  Surgeon: [name], MD;  Location: [facility];  Service: Orthopedics;  Laterality: Right;  with ISB   SHOULDER SURGERY Left     Social History   Socioeconomic History   Marital status: Married    Spouse name: Not on file   Number of  children: 2   Years of education: 83   Highest education level: Not on file  Occupational History   Not on file  Tobacco Use   Smoking status: Former    Types: Cigarettes    Quit date: 07/10/1978    Years since quitting: 44.0   Smokeless tobacco: Never  Vaping Use   Vaping Use: Never used  Substance and Sexual Activity   Alcohol use: No   Drug use: No   Sexual activity: Never  Other Topics Concern   Not on file  Social History Narrative      Caffeine use: coffee/coke daily   Right handed    Social Determinants of Health   Financial Resource Strain: Not on file  Food Insecurity: Not on file  Transportation Needs: Not on file  Physical Activity: Not on file  Stress: Not on file  Social Connections: Not on file  Intimate Partner Violence: Not on file     Allergies  Allergen Reactions   Codeine Nausea And Vomiting   Penicillins Other (See Comments)    Pt does not remember reaction     Outpatient Medications Prior  to Visit  Medication Sig Dispense Refill   acetaminophen (TYLENOL) 500 MG tablet Take 1,000 mg by mouth 2 (two) times daily as needed for headache, mild pain or moderate pain (for pain).     metoprolol tartrate (LOPRESSOR) 25 MG tablet Take 0.5 tablets (12.5 mg total) by mouth 2 (two) times daily. 90 tablet 3   Multiple Vitamins-Minerals (CENTRUM SILVER 50+MEN) TABS Take 1 tablet by mouth daily.     pravastatin (PRAVACHOL) 40 MG tablet Take 40 mg by mouth in the morning.     rOPINIRole (REQUIP) 0.5 MG tablet Take 0.5 mg by mouth at bedtime as needed (restless legs).     tamsulosin (FLOMAX) 0.4 MG CAPS capsule Take 0.4 mg by mouth daily as needed (prostate health).     No facility-administered medications prior to visit.    Review of Systems  Constitutional:  Negative for chills, fever, malaise/fatigue and weight loss.  HENT:  Negative for hearing loss, sore throat and tinnitus.   Eyes:  Negative for blurred vision and double vision.  Respiratory:   Negative for cough, hemoptysis, sputum production, shortness of breath, wheezing and stridor.   Cardiovascular:  Positive for palpitations. Negative for chest pain, orthopnea, leg swelling and PND.  Gastrointestinal:  Negative for abdominal pain, constipation, diarrhea, heartburn, nausea and vomiting.  Genitourinary:  Negative for dysuria, hematuria and urgency.  Musculoskeletal:  Negative for joint pain and myalgias.  Skin:  Negative for itching and rash.  Neurological:  Negative for dizziness, tingling, weakness and headaches.  Endo/Heme/Allergies:  Negative for environmental allergies. Does not bruise/bleed easily.  Psychiatric/Behavioral:  Negative for depression. The patient is not nervous/anxious and does not have insomnia.   All other systems reviewed and are negative.    Objective:  Physical Exam Vitals reviewed.  Constitutional:      General: He is not in acute distress.    Appearance: He is well-developed.  HENT:     Head: Normocephalic and atraumatic.      Comments: Left eyebrow laceration from fall Eyes:     General: No scleral icterus.    Conjunctiva/sclera: Conjunctivae normal.     Pupils: Pupils are equal, round, and reactive to light.  Neck:     Vascular: No JVD.     Trachea: No tracheal deviation.  Cardiovascular:     Rate and Rhythm: Normal rate and regular rhythm.     Heart sounds: Normal heart sounds. No murmur heard. Pulmonary:     Effort: Pulmonary effort is normal. No tachypnea, accessory muscle usage or respiratory distress.     Breath sounds: No stridor. No wheezing, rhonchi or rales.  Abdominal:     General: There is no distension.     Palpations: Abdomen is soft.     Tenderness: There is no abdominal tenderness.  Musculoskeletal:        General: No tenderness.     Cervical back: Neck supple.  Lymphadenopathy:     Cervical: No cervical adenopathy.  Skin:    General: Skin is warm and dry.     Capillary Refill: Capillary refill takes less than 2  seconds.     Findings: No rash.  Neurological:     Mental Status: He is alert and oriented to person, place, and time.  Psychiatric:        Behavior: Behavior normal.      Vitals:   08/04/22 1323  BP: 130/85  Pulse: (!) 59  SpO2: 99%  Weight: 168 lb (76.2 kg)  Height: '5\' 9"'$  (  1.753 m)   99% on RA BMI Readings from Last 3 Encounters:  08/04/22 24.81 kg/m  07/28/22 23.99 kg/m  07/13/22 25.10 kg/m   Wt Readings from Last 3 Encounters:  08/04/22 168 lb (76.2 kg)  07/28/22 162 lb 7.7 oz (73.7 kg)  07/13/22 170 lb (77.1 kg)     CBC    Component Value Date/Time   WBC 6.8 07/27/2022 0356   RBC 3.76 (L) 07/27/2022 0356   HGB 12.7 (L) 07/27/2022 0356   HCT 34.9 (L) 07/27/2022 0356   PLT 122 (L) 07/27/2022 0356   MCV 92.8 07/27/2022 0356   MCH 33.8 07/27/2022 0356   MCHC 36.4 (H) 07/27/2022 0356   RDW 13.0 07/27/2022 0356    Chest Imaging: Cervical spine CT completed on 07/26/2022 which revealed a left upper lobe 8 mm solid pulmonary nodule that has enlarged in size from 4 mm. The patient's images have been independently reviewed by me.    Pulmonary Functions Testing Results:     No data to display          FeNO:   Pathology:   Echocardiogram:   Heart Catheterization:     Assessment & Plan:     ICD-10-CM   1. Lung nodule  R91.1 CT Super D Chest Wo Contrast    2. Family history of lung cancer  Z80.1     3. Former smoker  Z87.891       Discussion:  This is a 89 year old gentleman, incidental found pulmonary nodule.  Was initially noted in 2022.  Had a CT scan of the cervical spine which caught the left upper lobe pulmonary nodule after a fall at home.  He has done fine recovered well since his fall however here today for follow-up regarding nodule.  Plan: Will recommend a full CT scan of the chest which she has yet to receive. This will evaluate the rest of his lung parenchyma. He does have a significant family history with both brothers who have  died from lung cancer. The nodule of question has grown in size as well over the past 2 years. We will see him back in a couple of weeks after his CT chest is complete to discuss next steps. Can see me or APP in follow-up to review CT imaging.   Current Outpatient Medications:    acetaminophen (TYLENOL) 500 MG tablet, Take 1,000 mg by mouth 2 (two) times daily as needed for headache, mild pain or moderate pain (for pain)., Disp: , Rfl:    metoprolol tartrate (LOPRESSOR) 25 MG tablet, Take 0.5 tablets (12.5 mg total) by mouth 2 (two) times daily., Disp: 90 tablet, Rfl: 3   Multiple Vitamins-Minerals (CENTRUM SILVER 50+MEN) TABS, Take 1 tablet by mouth daily., Disp: , Rfl:    pravastatin (PRAVACHOL) 40 MG tablet, Take 40 mg by mouth in the morning., Disp: , Rfl:    rOPINIRole (REQUIP) 0.5 MG tablet, Take 0.5 mg by mouth at bedtime as needed (restless legs)., Disp: , Rfl:    tamsulosin (FLOMAX) 0.4 MG CAPS capsule, Take 0.4 mg by mouth daily as needed (prostate health)., Disp: , Rfl:    Garner Nash, DO [facility] 08/04/2022 1:53 PM

## 2022-08-04 ENCOUNTER — Encounter: Payer: Self-pay | Admitting: Pulmonary Disease

## 2022-08-04 ENCOUNTER — Ambulatory Visit (INDEPENDENT_AMBULATORY_CARE_PROVIDER_SITE_OTHER): Payer: Medicare Other | Admitting: Pulmonary Disease

## 2022-08-04 VITALS — BP 130/85 | HR 59 | Ht 69.0 in | Wt 168.0 lb

## 2022-08-04 DIAGNOSIS — R911 Solitary pulmonary nodule: Secondary | ICD-10-CM | POA: Diagnosis not present

## 2022-08-04 DIAGNOSIS — Z87891 Personal history of nicotine dependence: Secondary | ICD-10-CM

## 2022-08-04 DIAGNOSIS — Z801 Family history of malignant neoplasm of trachea, bronchus and lung: Secondary | ICD-10-CM

## 2022-08-04 NOTE — Patient Instructions (Addendum)
Thank you for visiting Dr. Valeta Lewis at [facility]. Today we recommend the following:  Orders Placed This Encounter  Procedures   CT Super D Chest Wo Contrast   Follow up with [place] after ct chest complete.   Return in about 3 years (around 08/04/2025) for with APP or Dr. Valeta Lewis.    Please do your part to reduce the spread of COVID-19.

## 2022-08-07 DIAGNOSIS — U071 COVID-19: Secondary | ICD-10-CM | POA: Diagnosis not present

## 2022-08-07 DIAGNOSIS — J449 Chronic obstructive pulmonary disease, unspecified: Secondary | ICD-10-CM | POA: Diagnosis not present

## 2022-08-07 DIAGNOSIS — N411 Chronic prostatitis: Secondary | ICD-10-CM | POA: Diagnosis not present

## 2022-08-07 DIAGNOSIS — J42 Unspecified chronic bronchitis: Secondary | ICD-10-CM | POA: Diagnosis not present

## 2022-08-07 DIAGNOSIS — G2581 Restless legs syndrome: Secondary | ICD-10-CM | POA: Diagnosis not present

## 2022-08-07 DIAGNOSIS — J438 Other emphysema: Secondary | ICD-10-CM | POA: Diagnosis not present

## 2022-08-07 DIAGNOSIS — Z09 Encounter for follow-up examination after completed treatment for conditions other than malignant neoplasm: Secondary | ICD-10-CM | POA: Diagnosis not present

## 2022-08-07 DIAGNOSIS — I10 Essential (primary) hypertension: Secondary | ICD-10-CM | POA: Diagnosis not present

## 2022-08-08 DIAGNOSIS — J42 Unspecified chronic bronchitis: Secondary | ICD-10-CM | POA: Diagnosis not present

## 2022-08-08 DIAGNOSIS — J438 Other emphysema: Secondary | ICD-10-CM | POA: Diagnosis not present

## 2022-08-08 DIAGNOSIS — U071 COVID-19: Secondary | ICD-10-CM | POA: Diagnosis not present

## 2022-08-08 DIAGNOSIS — N411 Chronic prostatitis: Secondary | ICD-10-CM | POA: Diagnosis not present

## 2022-08-08 DIAGNOSIS — G2581 Restless legs syndrome: Secondary | ICD-10-CM | POA: Diagnosis not present

## 2022-08-08 DIAGNOSIS — I10 Essential (primary) hypertension: Secondary | ICD-10-CM | POA: Diagnosis not present

## 2022-08-10 DIAGNOSIS — J438 Other emphysema: Secondary | ICD-10-CM | POA: Diagnosis not present

## 2022-08-10 DIAGNOSIS — U071 COVID-19: Secondary | ICD-10-CM | POA: Diagnosis not present

## 2022-08-10 DIAGNOSIS — N411 Chronic prostatitis: Secondary | ICD-10-CM | POA: Diagnosis not present

## 2022-08-10 DIAGNOSIS — G2581 Restless legs syndrome: Secondary | ICD-10-CM | POA: Diagnosis not present

## 2022-08-10 DIAGNOSIS — I10 Essential (primary) hypertension: Secondary | ICD-10-CM | POA: Diagnosis not present

## 2022-08-10 DIAGNOSIS — J42 Unspecified chronic bronchitis: Secondary | ICD-10-CM | POA: Diagnosis not present

## 2022-08-14 DIAGNOSIS — N411 Chronic prostatitis: Secondary | ICD-10-CM | POA: Diagnosis not present

## 2022-08-14 DIAGNOSIS — U071 COVID-19: Secondary | ICD-10-CM | POA: Diagnosis not present

## 2022-08-14 DIAGNOSIS — I10 Essential (primary) hypertension: Secondary | ICD-10-CM | POA: Diagnosis not present

## 2022-08-14 DIAGNOSIS — J42 Unspecified chronic bronchitis: Secondary | ICD-10-CM | POA: Diagnosis not present

## 2022-08-14 DIAGNOSIS — J438 Other emphysema: Secondary | ICD-10-CM | POA: Diagnosis not present

## 2022-08-14 DIAGNOSIS — G2581 Restless legs syndrome: Secondary | ICD-10-CM | POA: Diagnosis not present

## 2022-08-16 DIAGNOSIS — J438 Other emphysema: Secondary | ICD-10-CM | POA: Diagnosis not present

## 2022-08-16 DIAGNOSIS — G2581 Restless legs syndrome: Secondary | ICD-10-CM | POA: Diagnosis not present

## 2022-08-16 DIAGNOSIS — J42 Unspecified chronic bronchitis: Secondary | ICD-10-CM | POA: Diagnosis not present

## 2022-08-16 DIAGNOSIS — U071 COVID-19: Secondary | ICD-10-CM | POA: Diagnosis not present

## 2022-08-16 DIAGNOSIS — I10 Essential (primary) hypertension: Secondary | ICD-10-CM | POA: Diagnosis not present

## 2022-08-16 DIAGNOSIS — N411 Chronic prostatitis: Secondary | ICD-10-CM | POA: Diagnosis not present

## 2022-08-19 ENCOUNTER — Ambulatory Visit (HOSPITAL_BASED_OUTPATIENT_CLINIC_OR_DEPARTMENT_OTHER)
Admission: RE | Admit: 2022-08-19 | Discharge: 2022-08-19 | Disposition: A | Discharge status: Home / Self Care | Payer: Medicare Other | Source: Ambulatory Visit | Attending: Pulmonary Disease | Admitting: Pulmonary Disease

## 2022-08-19 DIAGNOSIS — R911 Solitary pulmonary nodule: Secondary | ICD-10-CM | POA: Insufficient documentation

## 2022-08-19 DIAGNOSIS — J439 Emphysema, unspecified: Secondary | ICD-10-CM | POA: Diagnosis not present

## 2022-08-24 DIAGNOSIS — I10 Essential (primary) hypertension: Secondary | ICD-10-CM | POA: Diagnosis not present

## 2022-08-24 DIAGNOSIS — J42 Unspecified chronic bronchitis: Secondary | ICD-10-CM | POA: Diagnosis not present

## 2022-08-24 DIAGNOSIS — U071 COVID-19: Secondary | ICD-10-CM | POA: Diagnosis not present

## 2022-08-24 DIAGNOSIS — N411 Chronic prostatitis: Secondary | ICD-10-CM | POA: Diagnosis not present

## 2022-08-24 DIAGNOSIS — G2581 Restless legs syndrome: Secondary | ICD-10-CM | POA: Diagnosis not present

## 2022-08-24 DIAGNOSIS — J438 Other emphysema: Secondary | ICD-10-CM | POA: Diagnosis not present

## 2022-08-25 ENCOUNTER — Encounter: Payer: Self-pay | Admitting: Pulmonary Disease

## 2022-08-25 ENCOUNTER — Ambulatory Visit (INDEPENDENT_AMBULATORY_CARE_PROVIDER_SITE_OTHER): Payer: Medicare Other | Admitting: Pulmonary Disease

## 2022-08-25 VITALS — BP 140/100 | HR 57 | Ht 69.0 in | Wt 168.2 lb

## 2022-08-25 DIAGNOSIS — R911 Solitary pulmonary nodule: Secondary | ICD-10-CM | POA: Diagnosis not present

## 2022-08-25 DIAGNOSIS — Z801 Family history of malignant neoplasm of trachea, bronchus and lung: Secondary | ICD-10-CM

## 2022-08-25 DIAGNOSIS — Z87891 Personal history of nicotine dependence: Secondary | ICD-10-CM

## 2022-08-25 MED ORDER — ALBUTEROL SULFATE (2.5 MG/3ML) 0.083% IN NEBU: 2.5000 mg | INHALATION_SOLUTION | Freq: Four times a day (QID) | RESPIRATORY_TRACT | 12 refills | Status: DC | PRN

## 2022-08-25 NOTE — Patient Instructions (Signed)
Thank you for visiting Dr. Valeta Harms at [facility]. Today we recommend the following:  Orders Placed This Encounter  Procedures   CT Super D Chest Wo Contrast   See [place] after you ct chest in Sept. 2024  Return in about 6 months (around 02/25/2023) for with Eric Form, NP, or Dr. Valeta Harms.    Please do your part to reduce the spread of COVID-19.

## 2022-08-25 NOTE — Progress Notes (Signed)
Synopsis: Referred in Feb 2024 for lung nodule by Antony Contras, MD  Subjective:   PATIENT ID: [name] GENDER: male DOB: April 29, 1933, MRN: [id number]  Chief Complaint  Patient presents with   Follow-up    F/up    89 yo M, former smoker, quit 60 years ago, two brothers with lung cancer. HTN HLD. Recently had covid. Was told that he had asthma at one time. He has OSA and was suppose to be using a CPAP machine but stopped.  Unfortunately he had a fall at home recently went to the emergency room had a CT scan of the head and cervical spine.  This revealed a left upper lobe pulmonary nodule that was present in 2022.  It showed growth from 4 mm now to 8 mm in largest cross-section.  Here today to talk about next best steps.  OV 08/25/2022: Patient has had slow increase in nodule size.  We reviewed his CT imaging that shows persistence of this left upper lobe nodule.  Is relatively stable since last CT imaging but it has grown over the years since 2022 from a 4 mm nodule to approximately 8 mm.  We talked about utility of continue to observe this with conservative measures versus consideration for bronchoscopy and biopsy.  He does need nebulizer solution refills.    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)    Hypocalcemia    Memory difficulty 05/15/2017   Memory loss    Orthostatic dizziness    Polymyalgia rheumatica (HCC)    Prostatic hyperplasia    Restless leg syndrome    Skin cancer, basal cell    Sleep apnea    Transient global amnesia      Family History  Problem Relation Age of Onset   CVA Mother    Hypertension Mother    Cancer - Prostate Father    Cancer - Lung Brother    Cancer - Lung Brother      Past  Surgical History:  Procedure Laterality Date   BACK SURGERY  1980   CARDIAC CATHETERIZATION     REVERSE SHOULDER ARTHROPLASTY Right 05/13/2021   Procedure: REVERSE SHOULDER ARTHROPLASTY;  Surgeon: [name], MD;  Location: [facility];  Service: Orthopedics;  Laterality: Right;  with ISB   SHOULDER SURGERY Left     Social History   Socioeconomic History   Marital status: Married    Spouse name: Not on file   Number of children: 2   Years of education: 54   Highest education level: Not on file  Occupational History   Not on file  Tobacco Use   Smoking status: Former    Types: Cigarettes    Quit date: 07/10/1978    Years since quitting: 44.1   Smokeless tobacco: Never  Vaping Use   Vaping Use: Never used  Substance and Sexual Activity   Alcohol use: No   Drug use: No   Sexual activity: Never  Other Topics Concern   Not on file  Social History Narrative      Caffeine use: coffee/coke daily   Right handed    Social Determinants of Health   Financial  Resource Strain: Not on file  Food Insecurity: Not on file  Transportation Needs: Not on file  Physical Activity: Not on file  Stress: Not on file  Social Connections: Not on file  Intimate Partner Violence: Not on file     Allergies  Allergen Reactions   Codeine Nausea And Vomiting   Penicillins Other (See Comments)    Pt does not remember reaction     Outpatient Medications Prior to Visit  Medication Sig Dispense Refill   acetaminophen (TYLENOL) 500 MG tablet Take 1,000 mg by mouth 2 (two) times daily as needed for headache, mild pain or moderate pain (for pain).     metoprolol tartrate (LOPRESSOR) 25 MG tablet Take 0.5 tablets (12.5 mg total) by mouth 2 (two) times daily. 90 tablet 3   Multiple Vitamins-Minerals (CENTRUM SILVER 50+MEN) TABS Take 1 tablet by mouth daily.     pravastatin (PRAVACHOL) 40 MG tablet Take 40 mg by mouth in the morning.     rOPINIRole (REQUIP) 0.5 MG tablet Take 0.5 mg by mouth at bedtime  as needed (restless legs).     tamsulosin (FLOMAX) 0.4 MG CAPS capsule Take 0.4 mg by mouth daily as needed (prostate health).     No facility-administered medications prior to visit.    Review of Systems  Constitutional:  Negative for chills, fever, malaise/fatigue and weight loss.  HENT:  Negative for hearing loss, sore throat and tinnitus.   Eyes:  Negative for blurred vision and double vision.  Respiratory:  Negative for cough, hemoptysis, sputum production, shortness of breath, wheezing and stridor.   Cardiovascular:  Negative for chest pain, palpitations, orthopnea, leg swelling and PND.  Gastrointestinal:  Negative for abdominal pain, constipation, diarrhea, heartburn, nausea and vomiting.  Genitourinary:  Negative for dysuria, hematuria and urgency.  Musculoskeletal:  Negative for joint pain and myalgias.  Skin:  Negative for itching and rash.  Neurological:  Negative for dizziness, tingling, weakness and headaches.  Endo/Heme/Allergies:  Negative for environmental allergies. Does not bruise/bleed easily.  Psychiatric/Behavioral:  Negative for depression. The patient is not nervous/anxious and does not have insomnia.   All other systems reviewed and are negative.    Objective:  Physical Exam Vitals reviewed.  Constitutional:      General: He is not in acute distress.    Appearance: He is well-developed.  HENT:     Head: Normocephalic and atraumatic.  Eyes:     General: No scleral icterus.    Conjunctiva/sclera: Conjunctivae normal.     Pupils: Pupils are equal, round, and reactive to light.  Neck:     Vascular: No JVD.     Trachea: No tracheal deviation.  Cardiovascular:     Rate and Rhythm: Normal rate and regular rhythm.     Heart sounds: Normal heart sounds. No murmur heard. Pulmonary:     Effort: Pulmonary effort is normal. No tachypnea, accessory muscle usage or respiratory distress.     Breath sounds: No stridor. No wheezing, rhonchi or rales.  Abdominal:      General: There is no distension.     Palpations: Abdomen is soft.     Tenderness: There is no abdominal tenderness.  Musculoskeletal:        General: No tenderness.     Cervical back: Neck supple.  Lymphadenopathy:     Cervical: No cervical adenopathy.  Skin:    General: Skin is warm and dry.     Capillary Refill: Capillary refill takes less than 2 seconds.  Findings: No rash.  Neurological:     Mental Status: He is alert and oriented to person, place, and time.  Psychiatric:        Behavior: Behavior normal.      Vitals:   08/25/22 1307  BP: (!) 140/100  Pulse: (!) 57  SpO2: 97%  Weight: 168 lb 3.2 oz (76.3 kg)  Height: 5\' 9"  (1.753 m)   97% on RA BMI Readings from Last 3 Encounters:  08/25/22 24.84 kg/m  08/04/22 24.81 kg/m  07/28/22 23.99 kg/m   Wt Readings from Last 3 Encounters:  08/25/22 168 lb 3.2 oz (76.3 kg)  08/04/22 168 lb (76.2 kg)  07/28/22 162 lb 7.7 oz (73.7 kg)     CBC    Component Value Date/Time   WBC 6.8 07/27/2022 0356   RBC 3.76 (L) 07/27/2022 0356   HGB 12.7 (L) 07/27/2022 0356   HCT 34.9 (L) 07/27/2022 0356   PLT 122 (L) 07/27/2022 0356   MCV 92.8 07/27/2022 0356   MCH 33.8 07/27/2022 0356   MCHC 36.4 (H) 07/27/2022 0356   RDW 13.0 07/27/2022 0356    Chest Imaging: Cervical spine CT completed on 07/26/2022 which revealed a left upper lobe 8 mm solid pulmonary nodule that has enlarged in size from 4 mm. The patient's images have been independently reviewed by me.    CT chest March 2024: Slowly increasing nodule size more solid in appearance left upper lobe. Indolent neoplasm not excluded. The patient's images have been independently reviewed by me.    Pulmonary Functions Testing Results:     No data to display          FeNO:   Pathology:   Echocardiogram:   Heart Catheterization:     Assessment & Plan:     ICD-10-CM   1. Lung nodule  R91.1 CT Super D Chest Wo Contrast    2. Family history of lung cancer   Z80.1     3. Former smoker  Z87.891       Discussion:  This is a 89 year old gentleman incidentally found pulmonary nodule initially noted in a 2022 CT.  Had follow-up CT imaging that showed that had slowly increased in size.  Plan: Repeat CT imaging shows that it is relatively stable but may be slowly increasing in size.  Indolent neoplasm has not been excluded. He also has a family history with brothers that both of died from lung cancer. Otherwise he has been doing well from a respiratory standpoint.  He is a former smoker. We talked about the pros and cons of continue to follow the nodule versus considerations for even biopsy. He thinks the next best step at his age would be to consider repeat CT in 6 months. If the lesion is still continue to grow I think he would be a good candidate for consideration of bronchoscopy biopsy and referral for SBRT. Patient is agreeable to that plan. Repeat CT in September 2024.  Follow-up with [place] after the CT is complete.    Current Outpatient Medications:    acetaminophen (TYLENOL) 500 MG tablet, Take 1,000 mg by mouth 2 (two) times daily as needed for headache, mild pain or moderate pain (for pain)., Disp: , Rfl:    albuterol (PROVENTIL) (2.5 MG/3ML) 0.083% nebulizer solution, Take 3 mLs (2.5 mg total) by nebulization every 6 (six) hours as needed for wheezing or shortness of breath., Disp: 75 mL, Rfl: 12   metoprolol tartrate (LOPRESSOR) 25 MG tablet, Take 0.5 tablets (12.5 mg  total) by mouth 2 (two) times daily., Disp: 90 tablet, Rfl: 3   Multiple Vitamins-Minerals (CENTRUM SILVER 50+MEN) TABS, Take 1 tablet by mouth daily., Disp: , Rfl:    pravastatin (PRAVACHOL) 40 MG tablet, Take 40 mg by mouth in the morning., Disp: , Rfl:    rOPINIRole (REQUIP) 0.5 MG tablet, Take 0.5 mg by mouth at bedtime as needed (restless legs)., Disp: , Rfl:    tamsulosin (FLOMAX) 0.4 MG CAPS capsule, Take 0.4 mg by mouth daily as needed (prostate health)., Disp: ,  Rfl:    Garner Nash, DO [facility] 08/25/2022 1:37 PM

## 2022-08-29 DIAGNOSIS — I10 Essential (primary) hypertension: Secondary | ICD-10-CM | POA: Diagnosis not present

## 2022-08-29 DIAGNOSIS — D649 Anemia, unspecified: Secondary | ICD-10-CM | POA: Diagnosis not present

## 2022-08-29 DIAGNOSIS — E785 Hyperlipidemia, unspecified: Secondary | ICD-10-CM | POA: Diagnosis not present

## 2022-08-29 DIAGNOSIS — G2581 Restless legs syndrome: Secondary | ICD-10-CM | POA: Diagnosis not present

## 2022-08-29 DIAGNOSIS — R911 Solitary pulmonary nodule: Secondary | ICD-10-CM | POA: Diagnosis not present

## 2022-08-29 DIAGNOSIS — J42 Unspecified chronic bronchitis: Secondary | ICD-10-CM | POA: Diagnosis not present

## 2022-08-29 DIAGNOSIS — U071 COVID-19: Secondary | ICD-10-CM | POA: Diagnosis not present

## 2022-08-29 DIAGNOSIS — K219 Gastro-esophageal reflux disease without esophagitis: Secondary | ICD-10-CM | POA: Diagnosis not present

## 2022-08-29 DIAGNOSIS — G4700 Insomnia, unspecified: Secondary | ICD-10-CM | POA: Diagnosis not present

## 2022-08-29 DIAGNOSIS — J438 Other emphysema: Secondary | ICD-10-CM | POA: Diagnosis not present

## 2022-08-29 DIAGNOSIS — N411 Chronic prostatitis: Secondary | ICD-10-CM | POA: Diagnosis not present

## 2022-08-29 DIAGNOSIS — N400 Enlarged prostate without lower urinary tract symptoms: Secondary | ICD-10-CM | POA: Diagnosis not present

## 2022-08-29 DIAGNOSIS — H8110 Benign paroxysmal vertigo, unspecified ear: Secondary | ICD-10-CM | POA: Diagnosis not present

## 2022-08-29 DIAGNOSIS — I951 Orthostatic hypotension: Secondary | ICD-10-CM | POA: Diagnosis not present

## 2022-08-29 DIAGNOSIS — Z96611 Presence of right artificial shoulder joint: Secondary | ICD-10-CM | POA: Diagnosis not present

## 2022-08-29 DIAGNOSIS — Z955 Presence of coronary angioplasty implant and graft: Secondary | ICD-10-CM | POA: Diagnosis not present

## 2022-08-31 NOTE — Progress Notes (Signed)
Reviewed at office visit   Thanks,  [place], DO [facility] 08/31/2022 5:00 PM

## 2022-09-12 DIAGNOSIS — H10013 Acute follicular conjunctivitis, bilateral: Secondary | ICD-10-CM | POA: Diagnosis not present

## 2022-09-12 DIAGNOSIS — R5383 Other fatigue: Secondary | ICD-10-CM | POA: Diagnosis not present

## 2022-09-12 DIAGNOSIS — J40 Bronchitis, not specified as acute or chronic: Secondary | ICD-10-CM | POA: Diagnosis not present

## 2022-09-12 DIAGNOSIS — J069 Acute upper respiratory infection, unspecified: Secondary | ICD-10-CM | POA: Diagnosis not present

## 2022-09-12 DIAGNOSIS — J309 Allergic rhinitis, unspecified: Secondary | ICD-10-CM | POA: Diagnosis not present

## 2022-09-12 DIAGNOSIS — Z03818 Encounter for observation for suspected exposure to other biological agents ruled out: Secondary | ICD-10-CM | POA: Diagnosis not present

## 2022-09-18 DIAGNOSIS — N411 Chronic prostatitis: Secondary | ICD-10-CM | POA: Diagnosis not present

## 2022-09-18 DIAGNOSIS — J438 Other emphysema: Secondary | ICD-10-CM | POA: Diagnosis not present

## 2022-09-18 DIAGNOSIS — G2581 Restless legs syndrome: Secondary | ICD-10-CM | POA: Diagnosis not present

## 2022-09-18 DIAGNOSIS — U071 COVID-19: Secondary | ICD-10-CM | POA: Diagnosis not present

## 2022-09-18 DIAGNOSIS — I10 Essential (primary) hypertension: Secondary | ICD-10-CM | POA: Diagnosis not present

## 2022-09-18 DIAGNOSIS — J42 Unspecified chronic bronchitis: Secondary | ICD-10-CM | POA: Diagnosis not present

## 2022-09-25 DIAGNOSIS — N1831 Chronic kidney disease, stage 3a: Secondary | ICD-10-CM | POA: Diagnosis not present

## 2022-09-25 DIAGNOSIS — G2581 Restless legs syndrome: Secondary | ICD-10-CM | POA: Diagnosis not present

## 2022-09-25 DIAGNOSIS — H9193 Unspecified hearing loss, bilateral: Secondary | ICD-10-CM | POA: Diagnosis not present

## 2022-09-25 DIAGNOSIS — J45909 Unspecified asthma, uncomplicated: Secondary | ICD-10-CM | POA: Diagnosis not present

## 2022-09-25 DIAGNOSIS — I130 Hypertensive heart and chronic kidney disease with heart failure and stage 1 through stage 4 chronic kidney disease, or unspecified chronic kidney disease: Secondary | ICD-10-CM | POA: Diagnosis not present

## 2022-09-25 DIAGNOSIS — J439 Emphysema, unspecified: Secondary | ICD-10-CM | POA: Diagnosis not present

## 2022-09-25 DIAGNOSIS — G4730 Sleep apnea, unspecified: Secondary | ICD-10-CM | POA: Diagnosis not present

## 2022-09-25 DIAGNOSIS — J449 Chronic obstructive pulmonary disease, unspecified: Secondary | ICD-10-CM | POA: Diagnosis not present

## 2022-09-25 DIAGNOSIS — M353 Polymyalgia rheumatica: Secondary | ICD-10-CM | POA: Diagnosis not present

## 2022-09-25 DIAGNOSIS — N400 Enlarged prostate without lower urinary tract symptoms: Secondary | ICD-10-CM | POA: Diagnosis not present

## 2022-09-25 DIAGNOSIS — E7800 Pure hypercholesterolemia, unspecified: Secondary | ICD-10-CM | POA: Diagnosis not present

## 2022-09-25 DIAGNOSIS — M810 Age-related osteoporosis without current pathological fracture: Secondary | ICD-10-CM | POA: Diagnosis not present

## 2022-09-26 ENCOUNTER — Telehealth: Payer: Self-pay | Admitting: Pulmonary Disease

## 2022-09-26 NOTE — Telephone Encounter (Signed)
Disregard

## 2022-09-27 DIAGNOSIS — J42 Unspecified chronic bronchitis: Secondary | ICD-10-CM | POA: Diagnosis not present

## 2022-09-27 DIAGNOSIS — I10 Essential (primary) hypertension: Secondary | ICD-10-CM | POA: Diagnosis not present

## 2022-09-27 DIAGNOSIS — J438 Other emphysema: Secondary | ICD-10-CM | POA: Diagnosis not present

## 2022-09-27 DIAGNOSIS — N411 Chronic prostatitis: Secondary | ICD-10-CM | POA: Diagnosis not present

## 2022-09-27 DIAGNOSIS — G2581 Restless legs syndrome: Secondary | ICD-10-CM | POA: Diagnosis not present

## 2022-09-27 DIAGNOSIS — U071 COVID-19: Secondary | ICD-10-CM | POA: Diagnosis not present

## 2022-09-28 DIAGNOSIS — N184 Chronic kidney disease, stage 4 (severe): Secondary | ICD-10-CM | POA: Diagnosis not present

## 2022-09-28 DIAGNOSIS — M1990 Unspecified osteoarthritis, unspecified site: Secondary | ICD-10-CM | POA: Diagnosis not present

## 2022-09-28 DIAGNOSIS — Z96611 Presence of right artificial shoulder joint: Secondary | ICD-10-CM | POA: Diagnosis not present

## 2022-09-28 DIAGNOSIS — E669 Obesity, unspecified: Secondary | ICD-10-CM | POA: Diagnosis not present

## 2022-09-28 DIAGNOSIS — H8110 Benign paroxysmal vertigo, unspecified ear: Secondary | ICD-10-CM | POA: Diagnosis not present

## 2022-09-28 DIAGNOSIS — J449 Chronic obstructive pulmonary disease, unspecified: Secondary | ICD-10-CM | POA: Diagnosis not present

## 2022-09-28 DIAGNOSIS — G2581 Restless legs syndrome: Secondary | ICD-10-CM | POA: Diagnosis not present

## 2022-09-28 DIAGNOSIS — G4700 Insomnia, unspecified: Secondary | ICD-10-CM | POA: Diagnosis not present

## 2022-09-28 DIAGNOSIS — N411 Chronic prostatitis: Secondary | ICD-10-CM | POA: Diagnosis not present

## 2022-09-28 DIAGNOSIS — K219 Gastro-esophageal reflux disease without esophagitis: Secondary | ICD-10-CM | POA: Diagnosis not present

## 2022-09-28 DIAGNOSIS — E785 Hyperlipidemia, unspecified: Secondary | ICD-10-CM | POA: Diagnosis not present

## 2022-09-28 DIAGNOSIS — I951 Orthostatic hypotension: Secondary | ICD-10-CM | POA: Diagnosis not present

## 2022-09-28 DIAGNOSIS — N39498 Other specified urinary incontinence: Secondary | ICD-10-CM | POA: Diagnosis not present

## 2022-09-28 DIAGNOSIS — G4730 Sleep apnea, unspecified: Secondary | ICD-10-CM | POA: Diagnosis not present

## 2022-09-28 DIAGNOSIS — Z8616 Personal history of COVID-19: Secondary | ICD-10-CM | POA: Diagnosis not present

## 2022-09-28 DIAGNOSIS — I130 Hypertensive heart and chronic kidney disease with heart failure and stage 1 through stage 4 chronic kidney disease, or unspecified chronic kidney disease: Secondary | ICD-10-CM | POA: Diagnosis not present

## 2022-09-28 DIAGNOSIS — R911 Solitary pulmonary nodule: Secondary | ICD-10-CM | POA: Diagnosis not present

## 2022-09-28 DIAGNOSIS — D649 Anemia, unspecified: Secondary | ICD-10-CM | POA: Diagnosis not present

## 2022-09-28 DIAGNOSIS — J438 Other emphysema: Secondary | ICD-10-CM | POA: Diagnosis not present

## 2022-09-28 DIAGNOSIS — Z955 Presence of coronary angioplasty implant and graft: Secondary | ICD-10-CM | POA: Diagnosis not present

## 2022-09-28 DIAGNOSIS — N401 Enlarged prostate with lower urinary tract symptoms: Secondary | ICD-10-CM | POA: Diagnosis not present

## 2022-09-28 DIAGNOSIS — Z87891 Personal history of nicotine dependence: Secondary | ICD-10-CM | POA: Diagnosis not present

## 2022-09-28 DIAGNOSIS — M353 Polymyalgia rheumatica: Secondary | ICD-10-CM | POA: Diagnosis not present

## 2022-09-28 DIAGNOSIS — Z85828 Personal history of other malignant neoplasm of skin: Secondary | ICD-10-CM | POA: Diagnosis not present

## 2022-09-28 DIAGNOSIS — I509 Heart failure, unspecified: Secondary | ICD-10-CM | POA: Diagnosis not present

## 2022-10-04 DIAGNOSIS — I130 Hypertensive heart and chronic kidney disease with heart failure and stage 1 through stage 4 chronic kidney disease, or unspecified chronic kidney disease: Secondary | ICD-10-CM | POA: Diagnosis not present

## 2022-10-04 DIAGNOSIS — N184 Chronic kidney disease, stage 4 (severe): Secondary | ICD-10-CM | POA: Diagnosis not present

## 2022-10-04 DIAGNOSIS — J438 Other emphysema: Secondary | ICD-10-CM | POA: Diagnosis not present

## 2022-10-04 DIAGNOSIS — I509 Heart failure, unspecified: Secondary | ICD-10-CM | POA: Diagnosis not present

## 2022-10-04 DIAGNOSIS — N411 Chronic prostatitis: Secondary | ICD-10-CM | POA: Diagnosis not present

## 2022-10-04 DIAGNOSIS — J449 Chronic obstructive pulmonary disease, unspecified: Secondary | ICD-10-CM | POA: Diagnosis not present

## 2022-10-10 DIAGNOSIS — N411 Chronic prostatitis: Secondary | ICD-10-CM | POA: Diagnosis not present

## 2022-10-10 DIAGNOSIS — I509 Heart failure, unspecified: Secondary | ICD-10-CM | POA: Diagnosis not present

## 2022-10-10 DIAGNOSIS — I130 Hypertensive heart and chronic kidney disease with heart failure and stage 1 through stage 4 chronic kidney disease, or unspecified chronic kidney disease: Secondary | ICD-10-CM | POA: Diagnosis not present

## 2022-10-10 DIAGNOSIS — J449 Chronic obstructive pulmonary disease, unspecified: Secondary | ICD-10-CM | POA: Diagnosis not present

## 2022-10-10 DIAGNOSIS — N184 Chronic kidney disease, stage 4 (severe): Secondary | ICD-10-CM | POA: Diagnosis not present

## 2022-10-10 DIAGNOSIS — J438 Other emphysema: Secondary | ICD-10-CM | POA: Diagnosis not present

## 2022-10-16 ENCOUNTER — Ambulatory Visit: Payer: Medicare Other | Admitting: Physician Assistant

## 2022-10-20 DIAGNOSIS — N184 Chronic kidney disease, stage 4 (severe): Secondary | ICD-10-CM | POA: Diagnosis not present

## 2022-10-20 DIAGNOSIS — J438 Other emphysema: Secondary | ICD-10-CM | POA: Diagnosis not present

## 2022-10-20 DIAGNOSIS — J449 Chronic obstructive pulmonary disease, unspecified: Secondary | ICD-10-CM | POA: Diagnosis not present

## 2022-10-20 DIAGNOSIS — I130 Hypertensive heart and chronic kidney disease with heart failure and stage 1 through stage 4 chronic kidney disease, or unspecified chronic kidney disease: Secondary | ICD-10-CM | POA: Diagnosis not present

## 2022-10-20 DIAGNOSIS — I509 Heart failure, unspecified: Secondary | ICD-10-CM | POA: Diagnosis not present

## 2022-10-20 DIAGNOSIS — N411 Chronic prostatitis: Secondary | ICD-10-CM | POA: Diagnosis not present

## 2022-10-26 DIAGNOSIS — J449 Chronic obstructive pulmonary disease, unspecified: Secondary | ICD-10-CM | POA: Diagnosis not present

## 2022-10-26 DIAGNOSIS — N184 Chronic kidney disease, stage 4 (severe): Secondary | ICD-10-CM | POA: Diagnosis not present

## 2022-10-26 DIAGNOSIS — I130 Hypertensive heart and chronic kidney disease with heart failure and stage 1 through stage 4 chronic kidney disease, or unspecified chronic kidney disease: Secondary | ICD-10-CM | POA: Diagnosis not present

## 2022-10-26 DIAGNOSIS — J438 Other emphysema: Secondary | ICD-10-CM | POA: Diagnosis not present

## 2022-10-26 DIAGNOSIS — I509 Heart failure, unspecified: Secondary | ICD-10-CM | POA: Diagnosis not present

## 2022-10-26 DIAGNOSIS — N411 Chronic prostatitis: Secondary | ICD-10-CM | POA: Diagnosis not present

## 2022-10-28 DIAGNOSIS — G4700 Insomnia, unspecified: Secondary | ICD-10-CM | POA: Diagnosis not present

## 2022-10-28 DIAGNOSIS — Z8616 Personal history of COVID-19: Secondary | ICD-10-CM | POA: Diagnosis not present

## 2022-10-28 DIAGNOSIS — N184 Chronic kidney disease, stage 4 (severe): Secondary | ICD-10-CM | POA: Diagnosis not present

## 2022-10-28 DIAGNOSIS — G2581 Restless legs syndrome: Secondary | ICD-10-CM | POA: Diagnosis not present

## 2022-10-28 DIAGNOSIS — Z87891 Personal history of nicotine dependence: Secondary | ICD-10-CM | POA: Diagnosis not present

## 2022-10-28 DIAGNOSIS — E785 Hyperlipidemia, unspecified: Secondary | ICD-10-CM | POA: Diagnosis not present

## 2022-10-28 DIAGNOSIS — D649 Anemia, unspecified: Secondary | ICD-10-CM | POA: Diagnosis not present

## 2022-10-28 DIAGNOSIS — H8110 Benign paroxysmal vertigo, unspecified ear: Secondary | ICD-10-CM | POA: Diagnosis not present

## 2022-10-28 DIAGNOSIS — G4730 Sleep apnea, unspecified: Secondary | ICD-10-CM | POA: Diagnosis not present

## 2022-10-28 DIAGNOSIS — M353 Polymyalgia rheumatica: Secondary | ICD-10-CM | POA: Diagnosis not present

## 2022-10-28 DIAGNOSIS — Z96611 Presence of right artificial shoulder joint: Secondary | ICD-10-CM | POA: Diagnosis not present

## 2022-10-28 DIAGNOSIS — I130 Hypertensive heart and chronic kidney disease with heart failure and stage 1 through stage 4 chronic kidney disease, or unspecified chronic kidney disease: Secondary | ICD-10-CM | POA: Diagnosis not present

## 2022-10-28 DIAGNOSIS — E669 Obesity, unspecified: Secondary | ICD-10-CM | POA: Diagnosis not present

## 2022-10-28 DIAGNOSIS — K219 Gastro-esophageal reflux disease without esophagitis: Secondary | ICD-10-CM | POA: Diagnosis not present

## 2022-10-28 DIAGNOSIS — J438 Other emphysema: Secondary | ICD-10-CM | POA: Diagnosis not present

## 2022-10-28 DIAGNOSIS — M1990 Unspecified osteoarthritis, unspecified site: Secondary | ICD-10-CM | POA: Diagnosis not present

## 2022-10-28 DIAGNOSIS — R911 Solitary pulmonary nodule: Secondary | ICD-10-CM | POA: Diagnosis not present

## 2022-10-28 DIAGNOSIS — Z85828 Personal history of other malignant neoplasm of skin: Secondary | ICD-10-CM | POA: Diagnosis not present

## 2022-10-28 DIAGNOSIS — I951 Orthostatic hypotension: Secondary | ICD-10-CM | POA: Diagnosis not present

## 2022-10-28 DIAGNOSIS — I509 Heart failure, unspecified: Secondary | ICD-10-CM | POA: Diagnosis not present

## 2022-10-28 DIAGNOSIS — Z955 Presence of coronary angioplasty implant and graft: Secondary | ICD-10-CM | POA: Diagnosis not present

## 2022-10-28 DIAGNOSIS — J449 Chronic obstructive pulmonary disease, unspecified: Secondary | ICD-10-CM | POA: Diagnosis not present

## 2022-10-28 DIAGNOSIS — N39498 Other specified urinary incontinence: Secondary | ICD-10-CM | POA: Diagnosis not present

## 2022-10-28 DIAGNOSIS — N401 Enlarged prostate with lower urinary tract symptoms: Secondary | ICD-10-CM | POA: Diagnosis not present

## 2022-10-28 DIAGNOSIS — N411 Chronic prostatitis: Secondary | ICD-10-CM | POA: Diagnosis not present

## 2022-11-03 DIAGNOSIS — J438 Other emphysema: Secondary | ICD-10-CM | POA: Diagnosis not present

## 2022-11-03 DIAGNOSIS — N411 Chronic prostatitis: Secondary | ICD-10-CM | POA: Diagnosis not present

## 2022-11-03 DIAGNOSIS — J449 Chronic obstructive pulmonary disease, unspecified: Secondary | ICD-10-CM | POA: Diagnosis not present

## 2022-11-03 DIAGNOSIS — I130 Hypertensive heart and chronic kidney disease with heart failure and stage 1 through stage 4 chronic kidney disease, or unspecified chronic kidney disease: Secondary | ICD-10-CM | POA: Diagnosis not present

## 2022-11-03 DIAGNOSIS — N184 Chronic kidney disease, stage 4 (severe): Secondary | ICD-10-CM | POA: Diagnosis not present

## 2022-11-03 DIAGNOSIS — I509 Heart failure, unspecified: Secondary | ICD-10-CM | POA: Diagnosis not present

## 2022-11-07 DIAGNOSIS — I509 Heart failure, unspecified: Secondary | ICD-10-CM | POA: Diagnosis not present

## 2022-11-07 DIAGNOSIS — N184 Chronic kidney disease, stage 4 (severe): Secondary | ICD-10-CM | POA: Diagnosis not present

## 2022-11-07 DIAGNOSIS — N411 Chronic prostatitis: Secondary | ICD-10-CM | POA: Diagnosis not present

## 2022-11-07 DIAGNOSIS — J449 Chronic obstructive pulmonary disease, unspecified: Secondary | ICD-10-CM | POA: Diagnosis not present

## 2022-11-07 DIAGNOSIS — I130 Hypertensive heart and chronic kidney disease with heart failure and stage 1 through stage 4 chronic kidney disease, or unspecified chronic kidney disease: Secondary | ICD-10-CM | POA: Diagnosis not present

## 2022-11-07 DIAGNOSIS — J438 Other emphysema: Secondary | ICD-10-CM | POA: Diagnosis not present

## 2022-11-08 ENCOUNTER — Encounter: Payer: Self-pay | Admitting: Cardiology

## 2022-11-08 ENCOUNTER — Ambulatory Visit: Payer: Medicare Other | Attending: Physician Assistant | Admitting: Cardiology

## 2022-11-08 VITALS — BP 130/62 | HR 60 | Ht 69.0 in | Wt 170.0 lb

## 2022-11-08 DIAGNOSIS — I493 Ventricular premature depolarization: Secondary | ICD-10-CM | POA: Diagnosis not present

## 2022-11-08 DIAGNOSIS — I10 Essential (primary) hypertension: Secondary | ICD-10-CM | POA: Diagnosis not present

## 2022-11-08 DIAGNOSIS — I951 Orthostatic hypotension: Secondary | ICD-10-CM | POA: Diagnosis not present

## 2022-11-08 NOTE — Patient Instructions (Signed)
Medication Instructions:  The current medical regimen is effective;  continue present plan and medications.  *If you need a refill on your cardiac medications before your next appointment, please call your pharmacy*  Follow-Up: At [facility], you and your health needs are our priority.  As part of our continuing mission to provide you with exceptional heart care, we have created designated Provider Care Teams.  These Care Teams include your primary Cardiologist (physician) and Advanced Practice Providers (APPs -  Physician Assistants and Nurse Practitioners) who all work together to provide you with the care you need, when you need it.  We recommend signing up for the patient portal called "MyChart".  Sign up information is provided on this After Visit Summary.  MyChart is used to connect with patients for Virtual Visits (Telemedicine).  Patients are able to view lab/test results, encounter notes, upcoming appointments, etc.  Non-urgent messages can be sent to your provider as well.   To learn more about what you can do with MyChart, go to [email or web address].    Your next appointment:   1 year(s)  Provider:   Mark Skains, MD      

## 2022-11-08 NOTE — Progress Notes (Signed)
Cardiology Office Note:    Date:  11/08/2022   ID:  [name], DOB Apr 02, 1933, MRN [id number]  PCP:  Tally Joe, MD    [facility] Providers Cardiologist:  Donato Schultz, MD     Referring MD: Tally Joe, MD    History of Present Illness:    [name] is a 89 y.o. male here for follow-up of palpitations, orthostatics.  Previously had significant orthostatic hypotension from blood pressures going from 180 down to 121.  Maybe have to be willing to tolerate higher blood pressures.  Very labile.  Previously several readings reviewed stable.  Palpitations noted with rare PVCs but no evidence of atrial fibrillation on monitor in 2023.  At prior visit we discontinued his losartan 25 mg and we tried metoprolol 12.5 mg twice a day given his skipping heartbeats.  Previously he had to hold onto the walls when walking.  Seems to be doing quite well.  Does feel some skips in the early afternoon.  His son is present for discussion.  Wearing High [employer] hat.  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)    Hypocalcemia    Memory difficulty 05/15/2017   Memory loss    Orthostatic dizziness    Polymyalgia rheumatica (HCC)    Prostatic hyperplasia    Restless leg syndrome    Skin cancer, basal cell    Sleep apnea    Transient global amnesia     Past Surgical History:  Procedure Laterality Date   BACK SURGERY  1980   CARDIAC CATHETERIZATION     REVERSE SHOULDER ARTHROPLASTY Right 05/13/2021   Procedure: REVERSE SHOULDER ARTHROPLASTY;  Surgeon: Beverely Low, MD;  Location: [facility];  Service: Orthopedics;  Laterality: Right;  with ISB   SHOULDER SURGERY Left     Current Medications: Current Meds   Medication Sig   acetaminophen (TYLENOL) 500 MG tablet Take 1,000 mg by mouth 2 (two) times daily as needed for headache, mild pain or moderate pain (for pain).   metoprolol tartrate (LOPRESSOR) 25 MG tablet Take 0.5 tablets (12.5 mg total) by mouth 2 (two) times daily.   Multiple Vitamins-Minerals (CENTRUM SILVER 50+MEN) TABS Take 1 tablet by mouth daily.   pravastatin (PRAVACHOL) 40 MG tablet Take 40 mg by mouth in the morning.   rOPINIRole (REQUIP) 0.5 MG tablet Take 0.5 mg by mouth at bedtime as needed (restless legs).   tamsulosin (FLOMAX) 0.4 MG CAPS capsule Take 0.4 mg by mouth daily as needed (prostate health).     Allergies:   Codeine and Penicillins   Social History   Socioeconomic History   Marital status: Married    Spouse name: Not on file   Number of children: 2   Years of education: 12   Highest education level: Not on file  Occupational History   Not on file  Tobacco Use   Smoking status: Former    Types: Cigarettes    Quit date: 07/10/1978    Years since quitting: 44.3   Smokeless tobacco: Never  Vaping Use   Vaping Use: Never used  Substance and Sexual Activity   Alcohol use: No   Drug use:  No   Sexual activity: Never  Other Topics Concern   Not on file  Social History Narrative      Caffeine use: coffee/coke daily   Right handed    Social Determinants of Health   Financial Resource Strain: Not on file  Food Insecurity: Not on file  Transportation Needs: Not on file  Physical Activity: Not on file  Stress: Not on file  Social Connections: Not on file     Family History: The patient's family history includes CVA in his mother; Cancer - Lung in his brother and brother; Cancer - Prostate in his father; Hypertension in his mother.  ROS:   Please see the history of present illness.     All other systems reviewed and are negative.  EKGs/Labs/Other Studies Reviewed:    The following studies were reviewed today: Cardiac Studies & Procedures        ECHOCARDIOGRAM  ECHOCARDIOGRAM COMPLETE 07/27/2022  Narrative ECHOCARDIOGRAM REPORT    Patient Name:   [name] Date of Exam: 07/27/2022 Medical Rec #:  [id number]    Height:       69.0 in Accession #:    [id number]   Weight:       168.2 lb Date of Birth:  Jun 08, 1933    BSA:          1.919 m Patient Age:    89 years     BP:           126/68 mmHg Patient Gender: M            HR:           63 bpm. Exam Location:  Inpatient  Procedure: 2D Echo, Cardiac Doppler and Color Doppler  Indications:    Syncope R55  History:        Patient has prior history of Echocardiogram examinations, most recent 03/19/2022. Signs/Symptoms:Syncope; Risk Factors:Hypertension, Dyslipidemia, Former Smoker and Sleep Apnea.  Sonographer:    Aron Baba Referring Phys: [id number] Cecille Po MELVIN   Sonographer Comments: Suboptimal parasternal window. Image acquisition challenging due to patient body habitus and Image acquisition challenging due to respiratory motion. IMPRESSIONS   1. Left ventricular ejection fraction, by estimation, is 70 to 75%. The left ventricle has hyperdynamic function. The left ventricle has no regional wall motion abnormalities. Left ventricular diastolic parameters are consistent with Grade I diastolic dysfunction (impaired relaxation). Elevated left atrial pressure. 2. Right ventricular systolic function is normal. The right ventricular size is normal. There is normal pulmonary artery systolic pressure. 3. Left atrial size was mildly dilated. 4. Trivial mitral valve regurgitation. 5. The aortic valve is tricuspid. Aortic valve regurgitation is trivial. Aortic valve sclerosis/calcification is present, without any evidence of aortic stenosis. 6. Aortic dilatation noted. There is mild dilatation of the ascending aorta, measuring 41 mm.  FINDINGS Left Ventricle: Left ventricular ejection fraction, by estimation, is 70 to 75%. The left ventricle has hyperdynamic function. The  left ventricle has no regional wall motion abnormalities. The left ventricular internal cavity size was normal in size. Left ventricular diastolic parameters are consistent with Grade I diastolic dysfunction (impaired relaxation). Elevated left atrial pressure.  Right Ventricle: The right ventricular size is normal. Right vetricular wall thickness was not assessed. Right ventricular systolic function is normal. There is normal pulmonary artery systolic pressure. The tricuspid regurgitant velocity is 1.25 m/s, and with an assumed right atrial pressure of 15 mmHg, the estimated right ventricular systolic pressure is 21.2 mmHg.  Left Atrium: Left atrial size was  mildly dilated.  Right Atrium: Right atrial size was normal in size.  Pericardium: There is no evidence of pericardial effusion.  Mitral Valve: There is mild thickening of the mitral valve leaflet(s). Mild mitral annular calcification. Trivial mitral valve regurgitation.  Tricuspid Valve: The tricuspid valve is normal in structure. Tricuspid valve regurgitation is trivial.  Aortic Valve: The aortic valve is tricuspid. Aortic valve regurgitation is trivial. Aortic regurgitation PHT measures 705 msec. Aortic valve sclerosis/calcification is present, without any evidence of aortic stenosis.  Pulmonic Valve: The pulmonic valve was normal in structure. Pulmonic valve regurgitation is not visualized.  Aorta: The aortic root is normal in size and structure and aortic dilatation noted. There is mild dilatation of the ascending aorta, measuring 41 mm.  IAS/Shunts: No atrial level shunt detected by color flow Doppler.   LEFT VENTRICLE PLAX 2D LVOT diam:     2.00 cm     Diastology LV SV:         83          LV e' medial:    4.68 cm/s LV SV Index:   43          LV E/e' medial:  17.5 LVOT Area:     3.14 cm    LV e' lateral:   5.11 cm/s LV E/e' lateral: 16.0  LV Volumes (MOD) LV vol d, MOD A2C: 43.7 ml LV vol d, MOD A4C: 48.3 ml LV vol s,  MOD A2C: 16.7 ml LV vol s, MOD A4C: 17.2 ml LV SV MOD A2C:     27.0 ml LV SV MOD A4C:     48.3 ml LV SV MOD BP:      31.1 ml  RIGHT VENTRICLE RV S prime:     11.50 cm/s TAPSE (M-mode): 1.9 cm  LEFT ATRIUM             Index        RIGHT ATRIUM           Index LA diam:        5.00 cm 2.60 cm/m   RA Area:     19.20 cm LA Vol (A2C):   48.8 ml 25.42 ml/m  RA Volume:   54.30 ml  28.29 ml/m LA Vol (A4C):   67.1 ml 34.96 ml/m LA Biplane Vol: 60.2 ml 31.36 ml/m AORTIC VALVE LVOT Vmax:   119.00 cm/s LVOT Vmean:  76.000 cm/s LVOT VTI:    0.264 m AI PHT:      705 msec  AORTA Ao Root diam: 3.90 cm Ao Asc diam:  4.00 cm  MITRAL VALVE               TRICUSPID VALVE MV Area (PHT): 2.36 cm    TR Peak grad:   6.2 mmHg MV Decel Time: 322 msec    TR Vmax:        125.00 cm/s MV E velocity: 81.80 cm/s MV A velocity: 91.70 cm/s  SHUNTS MV E/A ratio:  0.89        Systemic VTI:  0.26 m Systemic Diam: 2.00 cm  Dietrich Pates MD Electronically signed by Dietrich Pates MD Signature Date/Time: 07/27/2022/3:05:17 PM    Final    MONITORS  LONG TERM MONITOR (3-14 DAYS) 04/30/2022  Narrative   Sinus rhythm with average heart rate of 66 bpm   Brief runs of atrial tachycardia   Frequent premature atrial contractions   Rare PVCs   No evidence of atrial fibrillation - Reassuring  Symptoms of skipped beats may be associated with premature atrial contractions-benign.   Given his marked orthostatic hypotension, trying to avoid overzealous use of beta-blocker or calcium channel blocker.   Patch Wear Time:  14 days and 0 hours (2023-10-25T18:51:22-0400 to 2023-11-08T17:51:26-0500)  Patient had a min HR of 43 bpm, max HR of 193 bpm, and avg HR of 66 bpm. Predominant underlying rhythm was Sinus Rhythm. Bundle Branch Block/IVCD was present. 20 Supraventricular Tachycardia runs occurred, the run with the fastest interval lasting 14 beats with a max rate of 193 bpm, the longest lasting 10.4 secs with an avg  rate of 131 bpm. Isolated SVEs were frequent (15.1%, 161096), SVE Couplets were rare (<1.0%, 487), and SVE Triplets were rare (<1.0%, 42). Isolated VEs were rare (<1.0%), VE Couplets were rare (<1.0%), and no VE Triplets were present. Ventricular Bigeminy and Trigeminy were present.            EKG:   07/13/2022-sinus rhythm 75 right bundle branch block left anterior fascicular block, bifascicular block 03/27/2022:  Sinus rhythm. RBBB. LAFB. PVC. 12/19/2021:  sinus bradycardia 58 with right bundle branch block left anterior fascicular block, bifascicular block  Recent Labs: 03/18/2022: TSH 4.466 07/27/2022: ALT 16; Hemoglobin 12.7; Platelets 122 07/28/2022: BUN 14; Creatinine, Ser 0.94; Magnesium 2.1; Potassium 4.4; Sodium 140  Recent Lipid Panel No results found for: "CHOL", "TRIG", "HDL", "CHOLHDL", "VLDL", "LDLCALC", "LDLDIRECT"    Risk Assessment/Calculations:              Physical Exam:    VS:  BP 130/62   Pulse 60   Ht 5\' 9"  (1.753 m)   Wt 170 lb (77.1 kg)   SpO2 98%   BMI 25.10 kg/m     Wt Readings from Last 3 Encounters:  11/08/22 170 lb (77.1 kg)  08/25/22 168 lb 3.2 oz (76.3 kg)  08/04/22 168 lb (76.2 kg)     GEN:  Well nourished, well developed in no acute distress HEENT: Normal NECK: No JVD; No carotid bruits LYMPHATICS: No lymphadenopathy CARDIAC: RRR, no murmurs, rubs, gallops RESPIRATORY:  Clear to auscultation without rales, wheezing or rhonchi  ABDOMEN: Soft, non-tender, non-distended MUSCULOSKELETAL:  No edema; No deformity  SKIN: Warm and dry NEUROLOGIC:  Alert and oriented x 3 PSYCHIATRIC:  Normal affect   ASSESSMENT:    1. PVC's (premature ventricular contractions)   2. Essential hypertension   3. Orthostatic hypotension    PLAN:    In order of problems listed above:  PVCs - Feeling them in the afternoon.  I gave liberty to change his spacing of his metoprolol to tartrate 12.5 mg twice a day  If he would like to take 1 at breakfast and  1 just after lunch this is fine.  He is on a very low-dose.  He seems to see a change when he takes the medicine.  It seems to help him.  Orthostatic hypotension - Improved after stopping the losartan.  Occasionally will have an elevated blood pressure reading but for the most part he has been in a very respectable range.  In fact his vestibular physical therapy nurse came by and recorded 120 systolic.  1 year follow-up reasonable.          Medication Adjustments/Labs and Tests Ordered: Current medicines are reviewed at length with the patient today.  Concerns regarding medicines are outlined above.  No orders of the defined types were placed in this encounter.  No orders of the defined types were placed in this encounter.  Patient Instructions  Medication Instructions:  The current medical regimen is effective;  continue present plan and medications.  *If you need a refill on your cardiac medications before your next appointment, please call your pharmacy*  Follow-Up: At [facility], you and your health needs are our priority.  As part of our continuing mission to provide you with exceptional heart care, we have created designated Provider Care Teams.  These Care Teams include your primary Cardiologist (physician) and Advanced Practice Providers (APPs -  Physician Assistants and Nurse Practitioners) who all work together to provide you with the care you need, when you need it.  We recommend signing up for the patient portal called "MyChart".  Sign up information is provided on this After Visit Summary.  MyChart is used to connect with patients for Virtual Visits (Telemedicine).  Patients are able to view lab/test results, encounter notes, upcoming appointments, etc.  Non-urgent messages can be sent to your provider as well.   To learn more about what you can do with MyChart, go to [email or web address].    Your next appointment:   1 year(s)  Provider:   Donato Schultz,  MD        Signed, Donato Schultz, MD  11/08/2022 11:22 AM    [facility]

## 2022-11-13 DIAGNOSIS — I130 Hypertensive heart and chronic kidney disease with heart failure and stage 1 through stage 4 chronic kidney disease, or unspecified chronic kidney disease: Secondary | ICD-10-CM | POA: Diagnosis not present

## 2022-11-13 DIAGNOSIS — I509 Heart failure, unspecified: Secondary | ICD-10-CM | POA: Diagnosis not present

## 2022-11-13 DIAGNOSIS — N184 Chronic kidney disease, stage 4 (severe): Secondary | ICD-10-CM | POA: Diagnosis not present

## 2022-11-13 DIAGNOSIS — N411 Chronic prostatitis: Secondary | ICD-10-CM | POA: Diagnosis not present

## 2022-11-13 DIAGNOSIS — J438 Other emphysema: Secondary | ICD-10-CM | POA: Diagnosis not present

## 2022-11-13 DIAGNOSIS — J449 Chronic obstructive pulmonary disease, unspecified: Secondary | ICD-10-CM | POA: Diagnosis not present

## 2022-11-23 ENCOUNTER — Telehealth: Payer: Self-pay | Admitting: Pulmonary Disease

## 2022-11-23 DIAGNOSIS — N184 Chronic kidney disease, stage 4 (severe): Secondary | ICD-10-CM | POA: Diagnosis not present

## 2022-11-23 DIAGNOSIS — J449 Chronic obstructive pulmonary disease, unspecified: Secondary | ICD-10-CM | POA: Diagnosis not present

## 2022-11-23 DIAGNOSIS — N411 Chronic prostatitis: Secondary | ICD-10-CM | POA: Diagnosis not present

## 2022-11-23 DIAGNOSIS — I509 Heart failure, unspecified: Secondary | ICD-10-CM | POA: Diagnosis not present

## 2022-11-23 DIAGNOSIS — J438 Other emphysema: Secondary | ICD-10-CM | POA: Diagnosis not present

## 2022-11-23 DIAGNOSIS — I130 Hypertensive heart and chronic kidney disease with heart failure and stage 1 through stage 4 chronic kidney disease, or unspecified chronic kidney disease: Secondary | ICD-10-CM | POA: Diagnosis not present

## 2022-11-23 NOTE — Telephone Encounter (Signed)
Son calling. States Medicare D will not cover Albuterol neb solution. Wonders if Dad can get a inhaler instead. Maybe needs PA. States it has to come from Medicare Part A or B.  Pls call @ [phone]   [facility] Basset is [facility] on [address] Rosalita Levan

## 2022-11-24 NOTE — Telephone Encounter (Signed)
Is PA needed for albuterol nebulizer solution?  What else is covered by patients insurance?  Please route message back to triage

## 2022-11-27 NOTE — Telephone Encounter (Signed)
Patient must have nebulizer solutions ran through Medicare Part B as stated in previous note.

## 2022-11-28 MED ORDER — ALBUTEROL SULFATE (2.5 MG/3ML) 0.083% IN NEBU: 2.5000 mg | INHALATION_SOLUTION | Freq: Four times a day (QID) | RESPIRATORY_TRACT | 12 refills | Status: DC | PRN

## 2022-11-28 NOTE — Telephone Encounter (Signed)
Called and spoke with patient's son. I advised him that the pharmacy did not process Parts A and B because the prescription did not have a diagnosis code. He is aware that I will re-submit the prescription with the code. He verbalized understanding.   Nothing further needed at time of call.

## 2022-12-04 ENCOUNTER — Telehealth: Payer: Self-pay | Admitting: Pulmonary Disease

## 2022-12-04 NOTE — Telephone Encounter (Signed)
ATC pt's son LMTCB x1

## 2022-12-04 NOTE — Telephone Encounter (Signed)
Pt son called in stating his dad needs albuterol (PROVENTIL) (2.5 MG/3ML) 0.083% nebulizer solution sent to [facility]. Informed pt [facility] is a DME company, Pt son stated his prescription is being ran through Part D and needs to be ran Through Part A or B

## 2022-12-06 NOTE — Telephone Encounter (Signed)
Pt returning missed call. 

## 2022-12-07 NOTE — Telephone Encounter (Signed)
Called [name] and there was no answer- LMTCB.

## 2022-12-08 ENCOUNTER — Telehealth: Payer: Self-pay | Admitting: Cardiology

## 2022-12-08 ENCOUNTER — Other Ambulatory Visit: Payer: Self-pay

## 2022-12-08 MED ORDER — ALBUTEROL SULFATE (2.5 MG/3ML) 0.083% IN NEBU: 2.5000 mg | INHALATION_SOLUTION | Freq: Four times a day (QID) | RESPIRATORY_TRACT | 12 refills | Status: AC | PRN

## 2022-12-08 NOTE — Telephone Encounter (Signed)
Pt c/o medication issue:  1. Name of Medication: Metoprolol  2. How are you currently taking this medication (dosage and times per day)?   3. Are you having a reaction (difficulty breathing--STAT)?   4. What is your medication issue? Patient wants to know if his Metoprolol can be adjusted, . He is now taking it at 3:00 PM, but he still feels bad in the afternoon.

## 2022-12-08 NOTE — Telephone Encounter (Signed)
Son, [name] called back to discuss his father's concerns.  Reports at last OV Dr Anne Fu advised OK for pt to take pm dose of Metoprolol Tartrate early if having increased palpitations.  Pt has been taking his 2nd dose around 3 pm.  He states it does help but does not completely resolve his palps so that he feels better.  Son is asking if medications can be adjusted.  Advised Dr Anne Fu will need to know pt's HR/BPs before he will be able to safely adjust medications.  [name] will look into this and call back with VS.

## 2022-12-08 NOTE — Telephone Encounter (Signed)
Spoke with [name] (per [facility]) who states his dad needs albuterol neb refills sent to [facility]. Refill was sent in. Nothing further needed at this time.

## 2022-12-08 NOTE — Telephone Encounter (Signed)
Lm to call back to discuss concerns.

## 2022-12-13 NOTE — Telephone Encounter (Signed)
Pt nor son have called back with any further concerns.  Will close this encounter and await a return call.

## 2022-12-20 ENCOUNTER — Other Ambulatory Visit (HOSPITAL_COMMUNITY): Payer: Self-pay

## 2022-12-20 ENCOUNTER — Telehealth: Payer: Self-pay

## 2022-12-20 NOTE — Telephone Encounter (Signed)
Patient Advocate Encounter   Received notification from [facility] that prior authorization is required for Albuterol Sulfate (2.5 MG/3ML)0.083% nebulizer solution   Submitted: n/a [id number]  PA not submitted at this time. Awaiting response from office.

## 2022-12-22 NOTE — Telephone Encounter (Signed)
PA submitted based on provider response and is pending determination.

## 2022-12-25 NOTE — Telephone Encounter (Signed)
PA via Medicare Part D has been DENIED:   Denied. ALBUTEROL SULFATE Nebu Soln is used in a nebulizer. A nebulizer is a piece of durable medical equipment (DME). Drugs used with DME in the home are covered under Medicare Part B. Our records show that you do not live in a long term care (LTC) facility. We cannot pay for drugs under Medicare Part D if they are covered under Medicare Part A or B. We did not decide whether ALBUTEROL SULFATE Nebu Soln is medically necessary. We made our decision only on the fact that we cannot pay for the drug under Medicare Part D. For more information, talk to your prescriber or call 1- 800-MEDICARE.

## 2022-12-29 NOTE — Telephone Encounter (Signed)
Message routed to Dr. Tonia Brooms

## 2023-01-01 NOTE — Telephone Encounter (Signed)
Called patient's son to confirm the insurance but he did not answer. Left message for him to call us back.

## 2023-01-11 IMAGING — DX DG SHOULDER 2+V PORT*R*
1 series · 1 of 1 positions shown · non-contrast
Comparison: None.

CLINICAL DATA: Shoulder surgery

EXAM:
PORTABLE RIGHT SHOULDER

[shoulder ap]
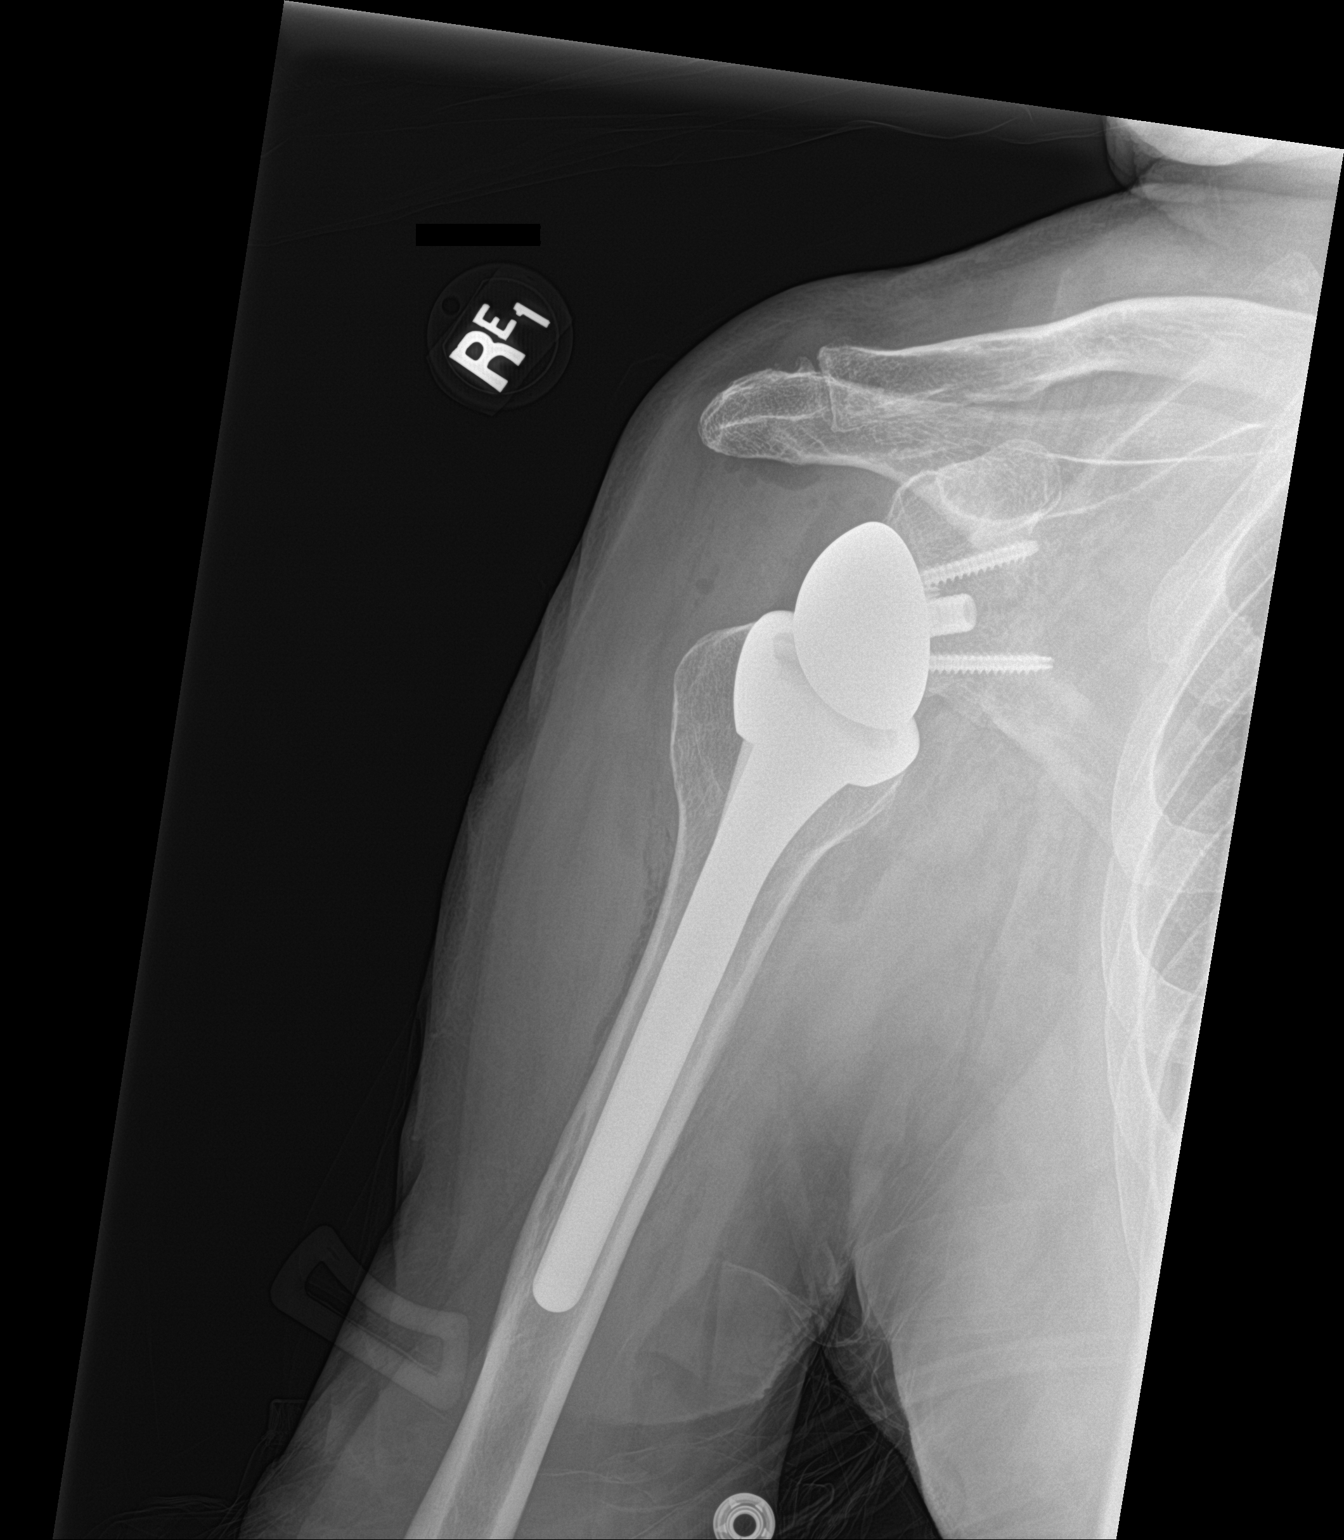

[1 of 1 positions shown; findings below may reference images not displayed]

FINDINGS: Postsurgical changes are shoulder arthroplasty. Normal alignment. No
evidence of loosening or periprosthetic fracture. Expected soft
tissue changes. Mild AC joint arthropathy. Reverse right total
IMPRESSION: No evidence of reverse right shoulder arthroplasty complication on
single frontal view.

## 2023-02-27 ENCOUNTER — Ambulatory Visit (HOSPITAL_BASED_OUTPATIENT_CLINIC_OR_DEPARTMENT_OTHER)
Admission: RE | Admit: 2023-02-27 | Discharge: 2023-02-27 | Disposition: A | Discharge status: Home / Self Care | Payer: Medicare Other | Source: Ambulatory Visit | Attending: Pulmonary Disease | Admitting: Pulmonary Disease

## 2023-02-27 DIAGNOSIS — R918 Other nonspecific abnormal finding of lung field: Secondary | ICD-10-CM | POA: Diagnosis not present

## 2023-02-27 DIAGNOSIS — R911 Solitary pulmonary nodule: Secondary | ICD-10-CM | POA: Diagnosis not present

## 2023-02-27 DIAGNOSIS — K859 Acute pancreatitis without necrosis or infection, unspecified: Secondary | ICD-10-CM | POA: Diagnosis not present

## 2023-02-28 ENCOUNTER — Telehealth: Payer: Self-pay | Admitting: Acute Care

## 2023-02-28 DIAGNOSIS — R911 Solitary pulmonary nodule: Secondary | ICD-10-CM

## 2023-02-28 NOTE — Telephone Encounter (Signed)
Results reviewed by Kandice Robinsons, NP and recommendation for repeat CT chest one year with OV follow up.  Lung nodule stable and no concern for cancer.  Results reviewed with son, [name] and relayed to patient.  OV for 03/02/23 cancelled. Order placed for one year CT chest wo and follow up OV with SG or BI.  Verbalized understanding. Patient complained of some right 'side' discomfort when positioning in bed.  Will try OTC pain relief and if no improvement will follow up with provider. Denied symptoms of illness such as increased shortness of breath or chest congestion or fever.

## 2023-03-02 ENCOUNTER — Ambulatory Visit: Payer: Medicare Other | Admitting: Acute Care

## 2023-03-12 ENCOUNTER — Ambulatory Visit: Payer: Medicare Other | Admitting: Cardiology

## 2023-03-14 NOTE — Progress Notes (Signed)
Results reviewed by SG. Please see phone note  Thanks,  BLI  Josephine Igo, DO [facility] 03/14/2023 4:25 PM

## 2023-04-20 ENCOUNTER — Other Ambulatory Visit: Payer: Self-pay | Admitting: Family Medicine

## 2023-04-20 DIAGNOSIS — Z1331 Encounter for screening for depression: Secondary | ICD-10-CM | POA: Diagnosis not present

## 2023-04-20 DIAGNOSIS — N644 Mastodynia: Secondary | ICD-10-CM

## 2023-04-20 DIAGNOSIS — J439 Emphysema, unspecified: Secondary | ICD-10-CM | POA: Diagnosis not present

## 2023-04-20 DIAGNOSIS — J45909 Unspecified asthma, uncomplicated: Secondary | ICD-10-CM | POA: Diagnosis not present

## 2023-04-20 DIAGNOSIS — N1831 Chronic kidney disease, stage 3a: Secondary | ICD-10-CM | POA: Diagnosis not present

## 2023-04-20 DIAGNOSIS — N400 Enlarged prostate without lower urinary tract symptoms: Secondary | ICD-10-CM | POA: Diagnosis not present

## 2023-04-20 DIAGNOSIS — Z23 Encounter for immunization: Secondary | ICD-10-CM | POA: Diagnosis not present

## 2023-04-20 DIAGNOSIS — R002 Palpitations: Secondary | ICD-10-CM | POA: Diagnosis not present

## 2023-04-20 DIAGNOSIS — G4700 Insomnia, unspecified: Secondary | ICD-10-CM | POA: Diagnosis not present

## 2023-04-20 DIAGNOSIS — M810 Age-related osteoporosis without current pathological fracture: Secondary | ICD-10-CM | POA: Diagnosis not present

## 2023-04-20 DIAGNOSIS — E7800 Pure hypercholesterolemia, unspecified: Secondary | ICD-10-CM | POA: Diagnosis not present

## 2023-04-20 DIAGNOSIS — I130 Hypertensive heart and chronic kidney disease with heart failure and stage 1 through stage 4 chronic kidney disease, or unspecified chronic kidney disease: Secondary | ICD-10-CM | POA: Diagnosis not present

## 2023-04-20 DIAGNOSIS — Z0000 Encounter for general adult medical examination without abnormal findings: Secondary | ICD-10-CM | POA: Diagnosis not present

## 2023-04-27 ENCOUNTER — Other Ambulatory Visit: Payer: Self-pay | Admitting: Cardiology

## 2023-04-30 ENCOUNTER — Ambulatory Visit: Payer: Medicare Other | Admitting: Cardiology

## 2023-05-01 NOTE — Progress Notes (Addendum)
Cardiology Office Note:    Date:  05/08/2023   ID:  Dwanye Lewis, DOB 30-Mar-1933, MRN [id number]  PCP:  Tally Joe, MD  Cardiologist:  Donato Schultz, MD     Referring MD: Tally Joe, MD   Chief Complaint: palpitations  History of Present Illness:    [name] is a 89 y.o. male with a history of palpitations with frequent PACs/ rare PVCs and brief runs of atrial tachycardia noted on monitor in 03/2022, hypertension complicated by significant orthostatic hypotension, hyperlipidemia, GERD, COPD, sleep apnea, and polymyalgia rheumatica who is followed by Dr. Anne Fu and presents today for evaluation of intermittent palpitations.   Patient has a history of palpitations and significant orthostatic hypotension. Monitor in 03/2022 showed sinus rhythm (average heart rate of 66 bpm) with frequent PACs, rare PVCs, and brief runs of atrial tachycardia but no atrial fibrillation. Management of his palpitations has been complicated by orthostatic hypotension and inability to increase AV nodal agents. Last Echo in 07/2022 showed LVEF of 70-75% with grade 1 diastolic dysfunction, normal RV, no significant valvular disease, and mild dilatation of the ascending aorta measuring 41 mm.   He was last seen by Dr. Anne Fu in 10/2022 at which time he reported feeling his PVCs in the afternoon but was otherwise doing quite well. Orthostatic hypotension symptoms had improved after stopping Losartan. Dr. Anne Fu gave him the liberty to change his Lopressor 12.5mg  to twice daily if needed based on symptoms.   Patient presents today for follow-up. Here with his son. Overall symptoms unchanged. He continues to have palpitations that he describes as a "skipped beat," mostly in the afternoon. He has been taking his Lopressor twice daily every day. He initially felt like the additional afternoon dose helped the palpitations but lately he has noticed these start to pick back up again. He denies any significant caffeine  intake (only one cup of coffee in the morning and occasionally one Pepsi later in the day). He does have dizziness but they are not related to the palpitations. He has dizziness with position changes especially when he moves quickly. This is stable. No falls or syncope. He has chronic dyspnea on exertion but this is not new and is stable. No shortness of breath at rest. No orthopnea, PND, or edema. No chest pain.   BP/ HR log over the last couple of months as shown BP ranging from 114-175/49-96 and HR ranging from 41 to 75. However, home machine may be reading his heart rate falsely low at time due to frequent PACs. BP today 144/80 and then 138/70 on my personal recheck at the end of visit.    EKGs/Labs/Other Studies Reviewed:    The following studies were reviewed:  Monitor 04/05/2022 to 04/19/2022: Patient had a min HR of 43 bpm, max HR of 193 bpm, and avg HR of 66 bpm. Predominant underlying rhythm was Sinus Rhythm. Bundle Branch Block/IVCD was present. 20 Supraventricular Tachycardia runs occurred, the run with the fastest interval lasting 14 beats with a max rate of 193 bpm, the longest lasting 10.4 secs with an avg rate of 131 bpm. Isolated SVEs were frequent (15.1%, [id number]), SVE Couplets were rare (<1.0%, 487), and SVE Triplets were rare (<1.0%, 42). Isolated VEs were rare (<1.0%), VE Couplets were rare (<1.0%), and no VE Triplets were present. Ventricular Bigeminy and Trigeminy were present.    Sinus rhythm with average heart rate of 66 bpm   Brief runs of atrial tachycardia   Frequent premature atrial contractions  Rare PVCs   No evidence of atrial fibrillation - Reassuring   Symptoms of skipped beats may be associated with premature atrial contractions-benign.   Given his marked orthostatic hypotension, trying to avoid overzealous use of beta-blocker or calcium channel blocker.  _______________  Echocardiogram 07/27/2022: Impressions: 1. Left ventricular ejection fraction, by  estimation, is 70 to 75%. The  left ventricle has hyperdynamic function. The left ventricle has no  regional wall motion abnormalities. Left ventricular diastolic parameters  are consistent with Grade I diastolic  dysfunction (impaired relaxation). Elevated left atrial pressure.   2. Right ventricular systolic function is normal. The right ventricular  size is normal. There is normal pulmonary artery systolic pressure.   3. Left atrial size was mildly dilated.   4. Trivial mitral valve regurgitation.   5. The aortic valve is tricuspid. Aortic valve regurgitation is trivial.  Aortic valve sclerosis/calcification is present, without any evidence of  aortic stenosis.   6. Aortic dilatation noted. There is mild dilatation of the ascending  aorta, measuring 41 mm.    EKG:  EKG ordered today.   EKG Interpretation Date/Time:  Tuesday May 08 2023 14:08:01 EST Ventricular Rate:  67 PR Interval:  162 QRS Duration:  138 QT Interval:  412 QTC Calculation: 435 R Axis:   -40  Text Interpretation: Sinus rhythm with occasional Premature ventricular complexes Left axis deviation Non-specific intra-ventricular conduction block Isolated T wave inversions in lead V3 Otherwise, no significant changes compared to prior tracings Confirmed by Marjie Skiff [phone]) on 05/08/2023 2:12:23 PM    Recent Labs: 07/27/2022: ALT 16; Hemoglobin 12.7; Platelets 122 07/28/2022: BUN 14; Creatinine, Ser 0.94; Magnesium 2.1; Potassium 4.4; Sodium 140  Recent Lipid Panel No results found for: "CHOL", "TRIG", "HDL", "CHOLHDL", "VLDL", "LDLCALC", "LDLDIRECT"  Physical Exam:    Vital Signs: BP (!) 144/80 (BP Location: Left Arm, Patient Position: Sitting, Cuff Size: Normal)   Pulse 67   Ht 5\' 9"  (1.753 m)   Wt 171 lb 3.2 oz (77.7 kg)   SpO2 98%   BMI 25.28 kg/m     Wt Readings from Last 3 Encounters:  05/08/23 171 lb 3.2 oz (77.7 kg)  11/08/22 170 lb (77.1 kg)  08/25/22 168 lb 3.2 oz (76.3 kg)      General: 89 y.o. Caucasian male in no acute distress. HEENT: Normocephalic and atraumatic. Sclera clear.  Neck: Supple. No carotid bruits. No JVD. Heart: RRR. Distinct S1 and S2. No murmurs, gallops, or rubs.  Lungs: No increased work of breathing. Clear to ausculation bilaterally. No wheezes, rhonchi, or rales.  Extremities: No lower extremity edema.   Skin: Warm and dry. Neuro: No focal deficits. Psych: Normal affect. Responds appropriately.   Assessment:    1. Palpitations   2. Orthostatic hypotension   3. Hyperlipidemia, unspecified hyperlipidemia type     Plan:    Palpitations Patient has a history of palpitations. Monitor in 03/2022 showed frequent PACs (burden 15.1%), rare PVCs, and brief runs of atrial tachycardia but no atrial fibrillation.  Management of his palpitations has been complicated by orthostatic hypotension and inability to increase AV nodal agents.  - He continues to have palpitations mostly in the afternoon that he describes as a "skipped beat." Sounds like PACs/ PVCs.  - EKG today shows normal sinus rhythm with PVCs.  - Continue Lopressor 12.5mg  twice daily.  - We had a long discussion about management of this. He is very symptomatic with his PACs/ PVCs and these are bothersome to him. However, AV  nodal agents are limited by soft resting heart rate and significant orthostatic hypotension. We discussed that PACs/ PVCs are usually benign. I am honestly more concerned with causing worsening orthostatic hypotension than I am about his palpitations. Question whether we could use low dose Amiodarone to help with his ectopy. Will discuss this with Dr. Anne Fu. - Would like to get baseline TSH and LFTs if we were to start Amiodarone. Son states he recently had labs checked at [facility] office. Will request a copy of these be faxed to [place].   Of note, patient requests that we call son Virge Sohl) who was present at today's visit whenever Dr. Anne Fu gets back to [place] with his  recommendations. His number is [phone].   Orthostatic Hypotension Patient has a history of symptomatic orthostatic hypotension.  - BP initially 144/80 and then 138/70 on my personal recheck at the end of visit. Home BP has ranged from 114-175/49-96 over the last couple of months. - He continues to have orthostatic symptoms (dizziness with position changes) but symptoms overall stable. - Continue low dose Lopressor as above. - Discussed importance of changing positions slowly and staying well hydrated. Also recommended wearing compression stockings which he has not tried yet.   Hyperlipidemia Lipid panel in 09/2022: Total Cholesterol 157, Triglycerides 218, HDL 31, LDL 89. - Continue Pravastatin 40mg  daily.  Disposition: Follow up in 6 months.    Signed, Corrin Parker, PA-C  05/08/2023 2:44 PM    [facility]  ADDENDUM: Discussed with Dr. Anne Fu. He did not recommend trying Amiodarone. Will update patient.  Corrin Parker, PA-C 06/03/2023 7:34 PM

## 2023-05-02 ENCOUNTER — Encounter: Payer: Self-pay | Admitting: Student

## 2023-05-08 ENCOUNTER — Encounter: Payer: Self-pay | Admitting: Student

## 2023-05-08 ENCOUNTER — Ambulatory Visit: Payer: Medicare Other | Attending: Student | Admitting: Student

## 2023-05-08 VITALS — BP 138/70 | HR 67 | Ht 69.0 in | Wt 171.2 lb

## 2023-05-08 DIAGNOSIS — E785 Hyperlipidemia, unspecified: Secondary | ICD-10-CM | POA: Diagnosis not present

## 2023-05-08 DIAGNOSIS — R002 Palpitations: Secondary | ICD-10-CM | POA: Diagnosis not present

## 2023-05-08 DIAGNOSIS — I951 Orthostatic hypotension: Secondary | ICD-10-CM | POA: Insufficient documentation

## 2023-05-08 NOTE — Patient Instructions (Signed)
Medication Instructions:  NO CHANGES *If you need a refill on your cardiac medications before your next appointment, please call your pharmacy*   Lab Work: NO LABS If you have labs (blood work) drawn today and your tests are completely normal, you will receive your results only by: MyChart Message (if you have MyChart) OR A paper copy in the mail If you have any lab test that is abnormal or we need to change your treatment, we will call you to review the results.   Testing/Procedures: NO TESTING   Follow-Up: At [facility], you and your health needs are our priority.  As part of our continuing mission to provide you with exceptional heart care, we have created designated Provider Care Teams.  These Care Teams include your primary Cardiologist (physician) and Advanced Practice Providers (APPs -  Physician Assistants and Nurse Practitioners) who all work together to provide you with the care you need, when you need it.  Your next appointment:   6 month(s)  Provider:   Donato Schultz, MD    Other Instructions PROVIDER RECOMMENDS WEARING COMPRESSION STOCKING AND STAYING WELL HYDRATED

## 2023-05-16 ENCOUNTER — Ambulatory Visit
Admission: RE | Admit: 2023-05-16 | Discharge: 2023-05-16 | Disposition: A | Discharge status: Home / Self Care | Payer: Medicare Other | Source: Ambulatory Visit | Attending: Family Medicine | Admitting: Family Medicine

## 2023-05-16 DIAGNOSIS — N644 Mastodynia: Secondary | ICD-10-CM

## 2023-06-03 ENCOUNTER — Telehealth: Payer: Self-pay | Admitting: Student

## 2023-06-03 NOTE — Telephone Encounter (Signed)
  I saw this patient on 05/08/2023 at which time he reported continued to palpitations. We discussed possibly using Amiodarone to help with his frequent PACs, but I wanted to discuss with Dr. Anne Fu before prescribing this. I did review this with Dr. Anne Fu and he did not recommend trying Amiodarone at this time.   [name], can you please notify patient and family of this? We will hold off on trying Amiodarone per discussion with Dr. Anne Fu. He can continue current dose of Lopressor. At last visit, patient requested that son [name]) be called with Dr. Minerva Fester recommendation. His number is [phone].   Thank you! [name]

## 2023-06-04 NOTE — Telephone Encounter (Signed)
KENNETH Paczkowski (SON) (DPR) informed of providers result & recommendations. Pt verbalized understanding. All questions, if any, were answered.

## 2023-09-26 ENCOUNTER — Telehealth: Payer: Self-pay | Admitting: Cardiology

## 2023-09-26 NOTE — Telephone Encounter (Signed)
 Patient's son called to see if a Dr. Renna Cary can set up a blood pressure monitor for patient. Please call back to discuss

## 2023-09-26 NOTE — Telephone Encounter (Signed)
 Spoke with patient's son Aimee Houseman who states he was not sure if Dr. Renna Cary or PCP Dr. Janifer Meigs discussed blood pressure monitoring in home.   Gino Lais to contact PCP to discuss third-party BP monitoring as this is not something that our office does. Aimee Houseman verbalized understanding and will call Dr. Will Hare office.  Will forward to Dr. Renna Cary' nurse for follow-up if needed.

## 2024-01-17 ENCOUNTER — Encounter: Payer: Self-pay | Admitting: Cardiology

## 2024-01-17 ENCOUNTER — Ambulatory Visit: Attending: Cardiology | Admitting: Cardiology

## 2024-01-17 VITALS — BP 187/114 | HR 69 | Ht 69.0 in | Wt 176.0 lb

## 2024-01-17 DIAGNOSIS — I493 Ventricular premature depolarization: Secondary | ICD-10-CM | POA: Diagnosis not present

## 2024-01-17 DIAGNOSIS — R002 Palpitations: Secondary | ICD-10-CM | POA: Diagnosis not present

## 2024-01-17 DIAGNOSIS — I951 Orthostatic hypotension: Secondary | ICD-10-CM | POA: Diagnosis not present

## 2024-01-17 DIAGNOSIS — R0609 Other forms of dyspnea: Secondary | ICD-10-CM

## 2024-01-17 DIAGNOSIS — J42 Unspecified chronic bronchitis: Secondary | ICD-10-CM

## 2024-01-17 NOTE — Patient Instructions (Signed)

## 2024-01-17 NOTE — Progress Notes (Signed)
 Cardiology Office Note:  .   Date:  01/17/2024  ID:  Willim Turnage, DOB Mar 12, 1933, MRN [id number] PCP: Seabron Lenis, MD  [facility] Providers Cardiologist:  Oneil Parchment, MD    History of Present Illness: .   [name] is a 89 y.o. male Discussed the use of AI scribe software for clinical note transcription with the patient, who gave verbal consent to proceed.  History of Present Illness Brannen Koppen is an 89 year old male with frequent PACs, rare PVCs, and orthostatic hypotension who presents for follow-up.  He experiences frequent premature atrial contractions (PACs) and rare premature ventricular contractions (PVCs), which have decreased in frequency recently. He is currently on metoprolol  12.5 mg twice a day.  He has significant orthostatic hypotension and experiences dizziness with positional changes, which he manages by sitting for a while before standing. He experiences dizziness with positional changes and manages this by sitting for a while before standing. His blood pressure readings at home vary, with some high readings but generally averaging around 140 mmHg systolic. He is currently on Lopressor .  He feels fatigued quickly and experiences shortness of breath with exertion, such as walking to the mailbox. He attributes some of this to aging and possibly his breathing issues. He has a history of COPD and was told he might have asthma. He uses albuterol  as needed.  His activity level has decreased since contracting COVID-19 twice, impacting his stamina. He used to [employer] several yards in the neighborhood but now finds it more challenging due to fatigue and shortness of breath.  He has a history of hyperlipidemia and is on pravastatin  40 mg daily. He also has GERD, sleep apnea, and polymyalgia rheumatica, but these conditions were not discussed in detail during this visit.  He used to smoke but quit over 30 years ago. He is active but has been limited by his wife's mobility  issues and environmental factors such as heat and humidity.      Studies Reviewed: .        Results Echocardiogram: Ejection Fraction (EF) 70-75%, mild dilation of the ascending aorta measuring 41 mm. Risk Assessment/Calculations:           Physical Exam:   VS:  BP (!) 187/114   Pulse 69   Ht 5' 9 (1.753 m)   Wt 176 lb (79.8 kg)   SpO2 98%   BMI 25.99 kg/m    Wt Readings from Last 3 Encounters:  01/17/24 176 lb (79.8 kg)  05/08/23 171 lb 3.2 oz (77.7 kg)  11/08/22 170 lb (77.1 kg)    GEN: Well nourished, well developed in no acute distress NECK: No JVD; No carotid bruits CARDIAC: RRR, no murmurs, no rubs, no gallops RESPIRATORY:  Clear to auscultation without rales, wheezing or rhonchi  ABDOMEN: Soft, non-tender, non-distended EXTREMITIES:  No edema; No deformity   ASSESSMENT AND PLAN: .    Assessment and Plan Assessment & Plan Orthostatic hypotension and essential hypertension Blood pressure readings are variable, with some high readings but generally well-managed. Experiences dizziness with positional changes, indicating significant orthostatic hypotension. Blood pressure in the office is higher than at home. Heart rate is 69 bpm. Orthostatic symptoms have improved with lifestyle modifications. - Verify accuracy of home blood pressure monitor with office equipment. - Encourage continued lifestyle modifications to manage orthostatic symptoms. - Maintain current medication regimen with metoprolol  12.5 mg twice daily.  Premature atrial contractions and rare premature ventricular contractions Reports of irregular heartbeats have decreased, with no  significant episodes recently. No skipped beats during examination. Avoidance of strong antiarrhythmic medications like amiodarone is preferred due to potential side effects. - Avoid initiation of strong antiarrhythmic medications unless symptoms worsen.  Chronic obstructive pulmonary disease and asthma Experiences shortness  of breath with exertion, possibly exacerbated by past smoking history. Diagnosed with COPD and possibly mild asthma. Uses albuterol  as needed, but may benefit from long-acting inhalers or steroid sprays. - Discuss with primary care physician or pulmonologist about the potential use of long-acting inhalers or steroid sprays. - Continue using albuterol  as needed for acute symptoms.  Hyperlipidemia Currently managed with pravastatin  40 mg daily. - Continue current medication regimen with pravastatin  40 mg daily.         Dispo: 1 yr  Signed, Oneil Parchment, MD

## 2024-02-27 ENCOUNTER — Ambulatory Visit (HOSPITAL_BASED_OUTPATIENT_CLINIC_OR_DEPARTMENT_OTHER)
Admission: RE | Admit: 2024-02-27 | Discharge: 2024-02-27 | Disposition: A | Discharge status: Home / Self Care | Source: Ambulatory Visit | Attending: Acute Care | Admitting: Acute Care

## 2024-02-27 DIAGNOSIS — R911 Solitary pulmonary nodule: Secondary | ICD-10-CM | POA: Insufficient documentation

## 2024-03-12 ENCOUNTER — Ambulatory Visit: Admitting: Acute Care

## 2024-03-18 ENCOUNTER — Encounter: Payer: Self-pay | Admitting: Acute Care

## 2024-03-18 ENCOUNTER — Ambulatory Visit (INDEPENDENT_AMBULATORY_CARE_PROVIDER_SITE_OTHER): Admitting: Acute Care

## 2024-03-18 VITALS — BP 172/88 | HR 51 | Temp 97.6°F | Ht 69.0 in | Wt 170.2 lb

## 2024-03-18 DIAGNOSIS — R911 Solitary pulmonary nodule: Secondary | ICD-10-CM | POA: Diagnosis not present

## 2024-03-18 DIAGNOSIS — R030 Elevated blood-pressure reading, without diagnosis of hypertension: Secondary | ICD-10-CM

## 2024-03-18 DIAGNOSIS — Z23 Encounter for immunization: Secondary | ICD-10-CM

## 2024-03-18 DIAGNOSIS — Z87891 Personal history of nicotine dependence: Secondary | ICD-10-CM | POA: Diagnosis not present

## 2024-03-18 DIAGNOSIS — R9389 Abnormal findings on diagnostic imaging of other specified body structures: Secondary | ICD-10-CM

## 2024-03-18 NOTE — Patient Instructions (Addendum)
 It is good to see you today. Your Ct Chest is stable, so we will do another 1 year follow up scan to monitor the 7 mm lung nodule.. You will get a call to get this scheduled closer to the time. Follow up with Lauraine NP 1-2 weeks after scan to review results. Call closer to the time to see if this can be made a  virtual video visit. Call for any unintentional weight loss, or blood in sputum when you cough so we can get you in to be seen sooner.  Call if you need us  sooner. Please contact office for sooner follow up if symptoms do not improve or worsen or seek emergency care   Flu shot today

## 2024-03-18 NOTE — Progress Notes (Signed)
 History of Present Illness [name] is a 89 y.o. male former smoker followed by Dr. Brenna for an 8 mm left upper lobe lung nodule( previously 4 mm), referred 07/2022. Family history of lung cancer in brothers. Currently following with Surveillance imaging annually.    03/18/2024 Discussed the use of AI scribe software for clinical note transcription with the patient, who gave verbal consent to proceed.  History of Present Illness [name] is an 89 year old male who presents for follow-up of a stable left upper lobe nodule.  He has been undergoing annual CT scans to monitor a nodule in the left upper lobe of his lung. The nodule currently measures seven millimeters and has remained stable since the last examination, although it is larger than it was in 2022. It remains under surveillance.  He has not experienced any recent unexplained weight loss, although he did lose about twenty pounds six months ago, which he attributes to having COVID-19. He has since regained some weight, currently weighing around 180 pounds, down from 160 pounds during the weight loss period.  He experiences shortness of breath. He has a history of smoking, though he quit years ago. It is most likely deconditioning and age related.      Test Results: 02/27/2024 CT Chest without Contrast Mediastinum/Nodes: No masses or pathologically enlarged lymph nodes identified on this unenhanced exam.   Lungs/Pleura: 7 mm solid pulmonary nodule in posterior left upper lobe on image 26/4 remains stable since prior exam but shows mild increase from earlier study in 2022. A few other tiny less than 5 mm pulmonary nodules in both upper lobes remains stable compared to prior studies. No No evidence of pulmonary infiltrate or pleural effusion.   Upper Abdomen:  Increased size of small hiatal hernia.   Musculoskeletal:  No suspicious bone lesions.   IMPRESSION: 7 mm left upper lobe pulmonary nodule remains stable  compared to 2024 exams, but shows mild increase from earlier study in 2022. Recommend continued follow-up by CT in 1 year.   Small hiatal hernia.    Latest Ref Rng & Units 07/27/2022    3:56 AM 07/26/2022   10:44 AM 03/19/2022   12:43 PM  CBC  WBC 4.0 - 10.5 K/uL 6.8  7.8  7.3   Hemoglobin 13.0 - 17.0 g/dL 87.2  86.2  84.6   Hematocrit 39.0 - 52.0 % 34.9  39.7  42.6   Platelets 150 - 400 K/uL 122  136  165        Latest Ref Rng & Units 07/28/2022    4:12 AM 07/27/2022    3:56 AM 07/26/2022    1:29 PM  BMP  Glucose 70 - 99 mg/dL 893  880  893   BUN 8 - 23 mg/dL 14  23  14    Creatinine 0.61 - 1.24 mg/dL 9.05  8.92  8.92   Sodium 135 - 145 mmol/L 140  134  137   Potassium 3.5 - 5.1 mmol/L 4.4  3.6  3.8   Chloride 98 - 111 mmol/L 109  103  102   CO2 22 - 32 mmol/L 22  20  26    Calcium 8.9 - 10.3 mg/dL 8.6  8.3  8.7     BNP No results found for: BNP  ProBNP No results found for: PROBNP  PFT No results found for: FEV1PRE, FEV1POST, FVCPRE, FVCPOST, TLC, DLCOUNC, PREFEV1FVCRT, PSTFEV1FVCRT  CT Chest Wo Contrast Result Date: 03/02/2024 CLINICAL DATA:  Follow-up left lung  nodule.  Smoker. EXAM: CT CHEST WITHOUT CONTRAST TECHNIQUE: Multidetector CT imaging of the chest was performed following the standard protocol without IV contrast. RADIATION DOSE REDUCTION: This exam was performed according to the departmental dose-optimization program which includes automated exposure control, adjustment of the mA and/or kV according to patient size and/or use of iterative reconstruction technique. COMPARISON:  02/27/2023 and 01/14/2021 FINDINGS: Cardiovascular: No acute findings. Aortic and coronary atherosclerotic calcification incidentally noted. Mediastinum/Nodes: No masses or pathologically enlarged lymph nodes identified on this unenhanced exam. Lungs/Pleura: 7 mm solid pulmonary nodule in posterior left upper lobe on image 26/4 remains stable since prior exam but shows mild  increase from earlier study in 2022. A few other tiny less than 5 mm pulmonary nodules in both upper lobes remains stable compared to prior studies. No No evidence of pulmonary infiltrate or pleural effusion. Upper Abdomen:  Increased size of small hiatal hernia. Musculoskeletal:  No suspicious bone lesions. IMPRESSION: 7 mm left upper lobe pulmonary nodule remains stable compared to 2024 exams, but shows mild increase from earlier study in 2022. Recommend continued follow-up by CT in 1 year. Small hiatal hernia. Electronically Signed   By: Norleen DELENA Kil M.D.   On: 03/02/2024 15:29     Past medical hx Past Medical History:  Diagnosis Date   Acne rosacea    Arthritis    Asthma    COPD (chronic obstructive pulmonary disease) (HCC)    Dyspnea    GERD without esophagitis    Hx of pancreatitis    Hyperlipidemia    Hypertension    Hypocalcemia    Memory difficulty 05/15/2017   Memory loss    Orthostatic dizziness    Polymyalgia rheumatica    Prostatic hyperplasia    Restless leg syndrome    Skin cancer, basal cell    Sleep apnea    Transient global amnesia      Social History   Tobacco Use   Smoking status: Former    Current packs/day: 0.00    Types: Cigarettes    Quit date: 07/10/1978    Years since quitting: 45.7   Smokeless tobacco: Never  Vaping Use   Vaping status: Never Used  Substance Use Topics   Alcohol use: No   Drug use: No    Mr.[name] reports that he quit smoking about 45 years ago. His smoking use included cigarettes. He has never used smokeless tobacco. He reports that he does not drink alcohol and does not use drugs.  Tobacco Cessation: Counseling given: Not Answered Former smoker with a 45 pack year smoking history.Quit 1964  Past surgical hx, Family hx, Social hx all reviewed.  Current Outpatient Medications on File Prior to Visit  Medication Sig   acetaminophen  (TYLENOL ) 500 MG tablet Take 1,000 mg by mouth 2 (two) times daily as needed for headache,  mild pain or moderate pain (for pain).   albuterol  (PROVENTIL ) (2.5 MG/3ML) 0.083% nebulizer solution Take 3 mLs (2.5 mg total) by nebulization every 6 (six) hours as needed for wheezing or shortness of breath.   azelastine (OPTIVAR) 0.05 % ophthalmic solution Apply to eye.   metoprolol  tartrate (LOPRESSOR ) 25 MG tablet TAKE 1/2 TABLET(12.5 MG) BY MOUTH TWICE DAILY   pravastatin  (PRAVACHOL ) 40 MG tablet Take 40 mg by mouth in the morning.   tamsulosin  (FLOMAX ) 0.4 MG CAPS capsule Take 0.4 mg by mouth daily as needed (prostate health).   rOPINIRole  (REQUIP ) 0.5 MG tablet Take 0.5 mg by mouth at bedtime as needed (restless legs). (Patient  not taking: Reported on 03/18/2024)   No current facility-administered medications on file prior to visit.     Allergies  Allergen Reactions   Codeine Nausea And Vomiting   Penicillins Other (See Comments)    Pt does not remember reaction    Review Of Systems:  Constitutional:   No  weight loss, night sweats,  Fevers, chills, fatigue, or  lassitude.  HEENT:   No headaches,  Difficulty swallowing,  Tooth/dental problems, or  Sore throat,                No sneezing, itching, ear ache, nasal congestion, post nasal drip,   CV:  No chest pain,  Orthopnea, PND, swelling in lower extremities, anasarca, dizziness, palpitations, syncope.   GI  No heartburn, indigestion, abdominal pain, nausea, vomiting, diarrhea, change in bowel habits, loss of appetite, bloody stools.   Resp: + shortness of breath with exertion less at rest.  No excess mucus, no productive cough,  No non-productive cough,  No coughing up of blood.  No change in color of mucus.  No wheezing.  No chest wall deformity  Skin: no rash or lesions.  GU: no dysuria, change in color of urine, no urgency or frequency.  No flank pain, no hematuria   MS:  No joint pain or swelling.  No decreased range of motion.  No back pain.  Psych:  No change in mood or affect. No depression or anxiety.  No memory  loss.   Vital Signs BP (!) 185/116   Pulse (!) 51   Temp 97.6 F (36.4 C) (Oral)   Ht 5' 9 (1.753 m)   Wt 170 lb 3.2 oz (77.2 kg)   SpO2 98%   BMI 25.13 kg/m    Physical Exam:  General- No distress,  A&Ox3, pleasant ENT: No sinus tenderness, TM clear, pale nasal mucosa, no oral exudate,no post nasal drip, no LAN Cardiac: S1, S2, regular rate and rhythm, no murmur Chest: No wheeze/ rales/ dullness; no accessory muscle use, no nasal flaring, no sternal retractions, slightly diminished per bases Abd.: Soft Non-tender, ND, BS +, Body mass index is 25.13 kg/m.  Ext: No clubbing cyanosis, edema, no obvious deformities Neuro:  normal strength, MAE x 4, A&O x 3, appropriate Skin: No rashes, warm and dry, no obvious skin lesions  Psych: normal mood and behavior   Assessment/Plan  Assessment and Plan Assessment & Plan Left upper lobe pulmonary nodule 7 mm nodule likely benign, stable since last CT, slight increase from 6 mm in 2022. Remote former smoker  - Schedule follow-up CT scan without contrast in September 2026 at [facility] on [address]. - Your Ct Chest is stable, so we will do another 1 year follow up scan to monitor the 7 mm lung nodule.. -You will get a call to get this scheduled closer to the time. -Follow up with Lauraine NP 1-2 weeks after scan to review results. -Call closer to the time to see if this can be made a  virtual video visit. -Call for any unintentional weight loss, or blood in sputum when you cough so we can get you in to be seen sooner.  -Call if you need us  sooner. -Please contact office for sooner follow up if symptoms do not improve or worsen or seek emergency care   -Flu shot today  BP Elevated in the Office Today Per son it is white coat syndrome, and is usually lower at home  Recheck in the office was lower  but still elevated.  Plan Please monitor BP at home and follow up with PCP if this remains elevated, or seek emergency care.   I  spent 20 minutes dedicated to the care of this patient on the date of this encounter to include pre-visit review of records, face-to-face time with the patient discussing conditions above, post visit ordering of testing, clinical documentation with the electronic health record, making appropriate referrals as documented, and communicating necessary information to the patient's healthcare team.     Lauraine JULIANNA Lites, NP 03/18/2024  10:47 AM

## 2024-03-20 ENCOUNTER — Encounter (HOSPITAL_BASED_OUTPATIENT_CLINIC_OR_DEPARTMENT_OTHER): Payer: Self-pay

## 2024-03-20 ENCOUNTER — Other Ambulatory Visit: Payer: Self-pay

## 2024-03-20 ENCOUNTER — Emergency Department (HOSPITAL_BASED_OUTPATIENT_CLINIC_OR_DEPARTMENT_OTHER)

## 2024-03-20 ENCOUNTER — Other Ambulatory Visit (HOSPITAL_BASED_OUTPATIENT_CLINIC_OR_DEPARTMENT_OTHER): Payer: Self-pay

## 2024-03-20 ENCOUNTER — Emergency Department (HOSPITAL_BASED_OUTPATIENT_CLINIC_OR_DEPARTMENT_OTHER)
Admission: EM | Admit: 2024-03-20 | Discharge: 2024-03-20 | Disposition: A | Discharge status: Home / Self Care | Attending: Emergency Medicine | Admitting: Emergency Medicine

## 2024-03-20 ENCOUNTER — Telehealth: Payer: Self-pay | Admitting: Cardiology

## 2024-03-20 DIAGNOSIS — R42 Dizziness and giddiness: Secondary | ICD-10-CM | POA: Diagnosis present

## 2024-03-20 DIAGNOSIS — I159 Secondary hypertension, unspecified: Secondary | ICD-10-CM | POA: Insufficient documentation

## 2024-03-20 DIAGNOSIS — Z79899 Other long term (current) drug therapy: Secondary | ICD-10-CM | POA: Diagnosis not present

## 2024-03-20 DIAGNOSIS — I10 Essential (primary) hypertension: Secondary | ICD-10-CM | POA: Insufficient documentation

## 2024-03-20 LAB — CBC WITH DIFFERENTIAL/PLATELET
Abs Immature Granulocytes: 0.01 K/uL (ref 0.00–0.07)
Basophils Absolute: 0.1 K/uL (ref 0.0–0.1)
Basophils Relative: 1 %
Eosinophils Absolute: 0.4 K/uL (ref 0.0–0.5)
Eosinophils Relative: 6 %
HCT: 37.2 % — ABNORMAL LOW (ref 39.0–52.0)
Hemoglobin: 13.4 g/dL (ref 13.0–17.0)
Immature Granulocytes: 0 %
Lymphocytes Relative: 35 %
Lymphs Abs: 2.6 K/uL (ref 0.7–4.0)
MCH: 33.7 pg (ref 26.0–34.0)
MCHC: 36 g/dL (ref 30.0–36.0)
MCV: 93.5 fL (ref 80.0–100.0)
Monocytes Absolute: 1.3 K/uL — ABNORMAL HIGH (ref 0.1–1.0)
Monocytes Relative: 17 %
Neutro Abs: 3.1 K/uL (ref 1.7–7.7)
Neutrophils Relative %: 41 %
Platelets: 146 K/uL — ABNORMAL LOW (ref 150–400)
RBC: 3.98 MIL/uL — ABNORMAL LOW (ref 4.22–5.81)
RDW: 13.2 % (ref 11.5–15.5)
WBC: 7.4 K/uL (ref 4.0–10.5)
nRBC: 0 % (ref 0.0–0.2)

## 2024-03-20 LAB — COMPREHENSIVE METABOLIC PANEL WITH GFR
ALT: 15 U/L (ref 0–44)
AST: 25 U/L (ref 15–41)
Albumin: 4.7 g/dL (ref 3.5–5.0)
Alkaline Phosphatase: 85 U/L (ref 38–126)
Anion gap: 14 (ref 5–15)
BUN: 12 mg/dL (ref 8–23)
CO2: 24 mmol/L (ref 22–32)
Calcium: 10.3 mg/dL (ref 8.9–10.3)
Chloride: 106 mmol/L (ref 98–111)
Creatinine, Ser: 1 mg/dL (ref 0.61–1.24)
GFR, Estimated: >60 mL/min (ref >60)
Glucose, Bld: 110 mg/dL — ABNORMAL HIGH (ref 70–99)
Potassium: 3.7 mmol/L (ref 3.5–5.1)
Sodium: 143 mmol/L (ref 135–145)
Total Bilirubin: 1 mg/dL (ref 0.0–1.2)
Total Protein: 7.9 g/dL (ref 6.5–8.1)

## 2024-03-20 LAB — TROPONIN T, HIGH SENSITIVITY: Troponin T High Sensitivity: <15 ng/L (ref 0–19)

## 2024-03-20 MED ORDER — AMLODIPINE BESYLATE 5 MG PO TABS
5.0000 mg | ORAL_TABLET | Freq: Every day | ORAL | 1 refills | Status: DC
  Filled 2024-03-20: qty 30, 30d supply, fill #0

## 2024-03-20 MED ORDER — AMLODIPINE BESYLATE 5 MG PO TABS
5.0000 mg | ORAL_TABLET | Freq: Once | ORAL | Status: AC
  Administered 2024-03-20: 5 mg via ORAL
  Filled 2024-03-20: qty 1

## 2024-03-20 MED ORDER — HYDRALAZINE HCL 20 MG/ML IJ SOLN
10.0000 mg | Freq: Once | INTRAMUSCULAR | Status: AC
  Administered 2024-03-20: 10 mg via INTRAVENOUS
  Filled 2024-03-20: qty 1

## 2024-03-20 NOTE — ED Notes (Signed)
 Reviewed discharge instructions, medications, and home care with pt. Pt verbalized understanding and had no further questions. Pt exited ED without complications.

## 2024-03-20 NOTE — ED Notes (Signed)
 Patient transported to CT

## 2024-03-20 NOTE — Telephone Encounter (Signed)
 Spoke with pt's son who stated the pt is at an appt and had his BP checked in both arms after not feeling well and SBP is over 200 in both arms on recheck. Pt is symptomatic with elevated BP but HR is in the 50s so they are worried about giving anything with HR so low. Advised for pt's son to take pt to ED to be evaluated with the elevated BP and low HR due to needing to get BP down while managing HR. Pt's son verbalized understanding of plan.

## 2024-03-20 NOTE — ED Triage Notes (Signed)
 Arrives ambulatory to the ED with complaints of elevated blood pressure. Patient states the he has been lightheaded as well. BP today was in the 200's. Patient took his BP meds a prescribed.

## 2024-03-20 NOTE — Telephone Encounter (Signed)
 Pt c/o BP issue: STAT if pt c/o blurred vision, one-sided weakness or slurred speech.  STAT if BP is GREATER than 180/120 TODAY.  STAT if BP is LESS than 90/60 and SYMPTOMATIC TODAY  1. What is your BP concern? High blood pressure   2. Have you taken any BP medication today? Yes   3. What are your last 5 BP readings? [name] states he was at appt with his wife and they checked his BP because he said he wasn't feeling good and it was over 200, they checked both arms.  He doesn't have the actual reading.   4. Are you having any other symptoms (ex. Dizziness, headache, blurred vision, passed out)? Feels lightheaded.

## 2024-03-20 NOTE — ED Provider Notes (Signed)
 [place] EMERGENCY DEPARTMENT AT [facility] Provider Note   CSN: [id number] Arrival date & time: 03/20/24  1207     Patient presents with: Hypertension   [name] is a 89 y.o. male.   Patient here for high blood pressure.  He has had some lightheadedness but no headaches no weakness numbness tingling.  No chest pain.  Nothing makes worse or better.  Is on metoprolol .  Denies any back pain chest pain weakness nausea vomiting diarrhea.  Is been in his normal state of health.  Checked his blood pressure today was elevated family brought him here for evaluation.  Has not had any loss of consciousness episodes.  He has not had any nausea vomiting diarrhea.  The history is provided by the patient and a caregiver.       Prior to Admission medications   Medication Sig Start Date End Date Taking? Authorizing Provider  amLODipine  (NORVASC ) 5 MG tablet Take 1 tablet (5 mg total) by mouth daily. 03/20/24 04/19/24 Yes Broderic Bara, DO  acetaminophen  (TYLENOL ) 500 MG tablet Take 1,000 mg by mouth 2 (two) times daily as needed for headache, mild pain or moderate pain (for pain).    [provider]  albuterol  (PROVENTIL ) (2.5 MG/3ML) 0.083% nebulizer solution Take 3 mLs (2.5 mg total) by nebulization every 6 (six) hours as needed for wheezing or shortness of breath. 12/08/22   Icard, Adine CROME, DO  azelastine (OPTIVAR) 0.05 % ophthalmic solution Apply to eye. 01/22/24   [provider]  metoprolol  tartrate (LOPRESSOR ) 25 MG tablet TAKE 1/2 TABLET(12.5 MG) BY MOUTH TWICE DAILY 04/27/23   Jeffrie Oneil BROCKS, MD  pravastatin  (PRAVACHOL ) 40 MG tablet Take 40 mg by mouth in the morning.    [provider]  rOPINIRole  (REQUIP ) 0.5 MG tablet Take 0.5 mg by mouth at bedtime as needed (restless legs). Patient not taking: Reported on 03/18/2024 10/06/21   [provider]  tamsulosin  (FLOMAX ) 0.4 MG CAPS capsule Take 0.4 mg by mouth daily as needed (prostate health).  03/06/22   [provider]    Allergies: Codeine and Penicillins    Review of Systems  Updated Vital Signs BP (!) 176/90   Pulse 73   Temp 97.9 F (36.6 C) (Oral)   Resp 20   Ht 5' 9 (1.753 m)   Wt 77.2 kg   SpO2 100%   BMI 25.13 kg/m   Physical Exam Vitals and nursing note reviewed.  Constitutional:      General: He is not in acute distress.    Appearance: He is well-developed. He is not ill-appearing.  HENT:     Head: Normocephalic and atraumatic.     Nose: Nose normal.     Mouth/Throat:     Mouth: Mucous membranes are moist.  Eyes:     Extraocular Movements: Extraocular movements intact.     Conjunctiva/sclera: Conjunctivae normal.     Pupils: Pupils are equal, round, and reactive to light.  Cardiovascular:     Rate and Rhythm: Normal rate and regular rhythm.     Pulses: Normal pulses.     Heart sounds: Normal heart sounds. No murmur heard. Pulmonary:     Effort: Pulmonary effort is normal. No respiratory distress.     Breath sounds: Normal breath sounds.  Abdominal:     General: Abdomen is flat.     Palpations: Abdomen is soft.     Tenderness: There is no abdominal tenderness.  Musculoskeletal:  General: No swelling or tenderness. Normal range of motion.     Cervical back: Normal range of motion and neck supple.  Skin:    General: Skin is warm and dry.     Capillary Refill: Capillary refill takes less than 2 seconds.  Neurological:     General: No focal deficit present.     Mental Status: He is alert and oriented to person, place, and time.     Cranial Nerves: No cranial nerve deficit.     Sensory: No sensory deficit.     Motor: No weakness.     Coordination: Coordination normal.     Comments: 5+ out of 5 strength throughout, normal sensation, no drift, normal finger-nose-finger, normal speech  Psychiatric:        Mood and Affect: Mood normal.     (all labs ordered are listed, but only abnormal results are displayed) Labs Reviewed   CBC WITH DIFFERENTIAL/PLATELET - Abnormal; Notable for the following components:      Result Value   RBC 3.98 (*)    HCT 37.2 (*)    Platelets 146 (*)    Monocytes Absolute 1.3 (*)    All other components within normal limits  COMPREHENSIVE METABOLIC PANEL WITH GFR - Abnormal; Notable for the following components:   Glucose, Bld 110 (*)    All other components within normal limits  TROPONIN T, HIGH SENSITIVITY    EKG: EKG Interpretation Date/Time:  Thursday March 20 2024 12:17:37 EDT Ventricular Rate:  57 PR Interval:  161 QRS Duration:  144 QT Interval:  446 QTC Calculation: 435 R Axis:   -52  Text Interpretation: Sinus rhythm RBBB and LAFB Confirmed by Ruthe Cornet [phone]) on 03/20/2024 12:49:37 PM  Radiology: CT Head Wo Contrast Result Date: 03/20/2024 EXAM: CT HEAD WITHOUT CONTRAST 03/20/2024 01:14:00 PM TECHNIQUE: CT of the head was performed without the administration of intravenous contrast. Automated exposure control, iterative reconstruction, and/or weight based adjustment of the mA/kV was utilized to reduce the radiation dose to as low as reasonably achievable. COMPARISON: CT head 07/26/2022 CLINICAL HISTORY: Headache, increasing frequency or severity. Arrives ambulatory to the ED with complaints of elevated blood pressure. FINDINGS: BRAIN AND VENTRICLES: No acute hemorrhage. No evidence of acute infarct. No hydrocephalus. No extra-axial collection. No mass effect or midline shift. ORBITS: No acute abnormality. SINUSES: No acute abnormality. SOFT TISSUES AND SKULL: No acute soft tissue abnormality. No skull fracture. IMPRESSION: 1. No acute intracranial abnormality. Electronically signed by: Gilmore Molt MD 03/20/2024 01:59 PM EDT RP Workstation: [id number]     Procedures   Medications Ordered in the ED  hydrALAZINE  (APRESOLINE ) injection 10 mg (10 mg Intravenous Given 03/20/24 1246)  amLODipine  (NORVASC ) tablet 5 mg (5 mg Oral Given 03/20/24 1246)                                     Medical Decision Making Amount and/or Complexity of Data Reviewed Labs: ordered. Radiology: ordered.  Risk Prescription drug management.   [name] is here with high blood pressure.  213/99 but otherwise unremarkable vitals.  EKG shows sinus rhythm.  No ischemic changes.  He is fairly asymptomatic.  Maybe a little headache lightheadedness.  Will get a CT of his head check basic labs including troponin.  He is not having any stroke symptoms or chest pain but will evaluate for any endorgan damage.  He is only on metoprolol  for blood pressure.  Will give him a dose of hydralazine  and amlodipine  here.  Overall blood pressure is greatly improved to 150 systolic.  Lab work for my review interpretation showed no significant leukocytosis anemia or electrolyte abnormality.  Troponin normal.  Head CT unremarkable.  Overall asymptomatic hypertension.  Will start him on amlodipine  and have him follow-up with primary care.  Discharged in good condition.  Understands return precautions.  This chart was dictated using voice recognition software.  Despite best efforts to proofread,  errors can occur which can change the documentation meaning.      Final diagnoses:  Secondary hypertension    ED Discharge Orders          Ordered    amLODipine  (NORVASC ) 5 MG tablet  Daily        03/20/24 1456               Ruthe Cornet, DO 03/20/24 1456

## 2024-04-04 NOTE — Progress Notes (Signed)
 Cardiology Office Note   Date:  04/08/2024  ID:  [name], DOB 02-Jul-1932, MRN [id number] PCP: Seabron Lenis, MD  [facility] Providers Cardiologist:  Oneil Parchment, MD   History of Present Illness [name] is a 89 y.o. male with a past medical history of frequent PACs, rare PVCs, orthostatic hypotension here for follow-up appointment.  Patient was experiencing frequent premature atrial contractions and rare premature ventricular contractions which had decreased in frequency when he was last seen in follow-up with Dr. Charlene 01/17/2024.  He had been on metoprolol  12.5 mg twice a day.  Was having significant orthostatic hypotension and experiencing dizziness with positional changes which she manages by sitting for a while before standing.  Experiences dizziness with positional changes and manages this by sitting for a while before standing.  His blood pressure readings at home vary with some high readings but generally averaging around 140 mmHg systolic.  Currently on Lopressor .  Feels fatigued quickly and experiences shortness of breath with exertion such as walking in the mailbox.  He attributes some of this to aging and possibly his breathing issues.  Has a history of COPD and was told he might need some albuterol  to use for his asthma.  Activity level had decreased since contracting COVID-19 twice, impacting his stamina.  [place] used to [employer] yards in the neighborhood and now he finds it more challenging due to fatigue and shortness of breath.  He has a history of hyperlipidemia and is on pravastatin  40 mg daily.  Also has a history of GERD, sleep apnea, and polymyalgia rheumatica but these conditions were not discussed in detail at the time of his last office visit.  He used to smoke but quit over 30 years ago.  He is active but has been limited by his wife's mobility issues environmental factors such as humidity and heat.  Today, he presents with a hx of hypertension and  orthostatic hypotension who presents with blood pressure management issues.  Three weeks ago, his blood pressure exceeded 200 mmHg, prompting an ER visit and initiation of amlodipine . Previously on metoprolol , his blood pressure now fluctuates between 112/60 mmHg and 213/99 mmHg. He experiences dizziness and nausea when supine. Orthostatic hypotension causes dizziness and lightheadedness, especially upon rising. He requires time sitting on the bed before ambulating. He consumes about 20 ounces of water  daily, with noted hydration challenges.  He has PVCs and PACs managed with metoprolol , taking half a dose twice daily and an additional half dose if palpitations occur. These arrhythmias are occasionally bothersome.  Reports no chest pain, pressure, or tightness. No edema, orthopnea, PND. Reports no palpitations.   Discussed the use of AI scribe software for clinical note transcription with the patient, who gave verbal consent to proceed.   ROS: pertinent ROS in HPI  Studies Reviewed      Echocardiogram was reviewed with LVEF 70 to 75%, mild dilation of the ascending aorta measuring 41 mm.  Physical Exam VS:  BP 112/60   Ht 5' 9 (1.753 m)   Wt 172 lb (78 kg)   SpO2 96%   BMI 25.40 kg/m  138/68      Wt Readings from Last 3 Encounters:  04/08/24 172 lb (78 kg)  03/20/24 170 lb 3.1 oz (77.2 kg)  03/18/24 170 lb 3.2 oz (77.2 kg)    GEN: Well nourished, well developed in no acute distress NECK: No JVD; No carotid bruits CARDIAC: RRR, no murmurs, rubs, gallops RESPIRATORY:  Clear to auscultation without  rales, wheezing or rhonchi  ABDOMEN: Soft, non-tender, non-distended EXTREMITIES:  No edema; No deformity   ASSESSMENT AND PLAN  Essential hypertension with orthostatic hypotension Blood pressure is low at 112/60 mmHg, with orthostatic hypotension causing dizziness. Amlodipine  and metoprolol  are used for blood pressure and heart rate control. - Continue amlodipine  at current  dose. - Recommend hydration and use of compression hose. - Consider home blood pressure monitoring device for trend analysis. - Recheck blood pressure in office today. (138/68)  Ventricular and supraventricular premature beats (PVCs and PACs) Metoprolol  effectively manages PVCs and PACs, with no extra beats on recent EKGs. Right bundle branch block is stable. - Continue metoprolol  as prescribed. - Allow additional half dose of metoprolol  if experiencing multiple skipped beats. - Monitor frequency of extra doses and re-evaluate if needed.  Hyperlipidemia Pravastatin  not taken for three weeks. LDL was 109 mg/dL, goal is below 70 mg/dL. HDL is low at 32 mg/dL. Pravastatin  intended to lower LDL and provide cardiovascular protection. - Ensure pravastatin  prescription is filled and taken regularly. - Consider increasing pravastatin  dose to 80 mg if tolerated. - Monitor for muscle aches and pains as potential side effects of increased statin dose. - Encourage increased physical activity to raise HDL levels.  Chronic obstructive pulmonary disease (COPD) Shortness of breath on exertion, no recent exacerbations. Symptoms may relate to decreased physical activity. - Encourage increased physical activity as tolerated.      Dispo: He can follow-up in 6 months with Dr. Jeffrie  Signed, Lance LOISE Fabry, PA-C

## 2024-04-08 ENCOUNTER — Encounter: Payer: Self-pay | Admitting: Physician Assistant

## 2024-04-08 ENCOUNTER — Ambulatory Visit: Attending: Physician Assistant | Admitting: Physician Assistant

## 2024-04-08 VITALS — BP 112/60 | Ht 69.0 in | Wt 172.0 lb

## 2024-04-08 DIAGNOSIS — I951 Orthostatic hypotension: Secondary | ICD-10-CM

## 2024-04-08 DIAGNOSIS — E785 Hyperlipidemia, unspecified: Secondary | ICD-10-CM

## 2024-04-08 DIAGNOSIS — R0609 Other forms of dyspnea: Secondary | ICD-10-CM | POA: Diagnosis not present

## 2024-04-08 DIAGNOSIS — I10 Essential (primary) hypertension: Secondary | ICD-10-CM

## 2024-04-08 DIAGNOSIS — J42 Unspecified chronic bronchitis: Secondary | ICD-10-CM

## 2024-04-08 DIAGNOSIS — I493 Ventricular premature depolarization: Secondary | ICD-10-CM | POA: Diagnosis not present

## 2024-04-08 MED ORDER — METOPROLOL TARTRATE 25 MG PO TABS: 12.5000 mg | ORAL_TABLET | Freq: Two times a day (BID) | ORAL | 1 refills | Status: AC

## 2024-04-08 MED ORDER — AMLODIPINE BESYLATE 5 MG PO TABS: 5.0000 mg | ORAL_TABLET | Freq: Every day | ORAL | 3 refills | Status: AC

## 2024-04-08 MED ORDER — PRAVASTATIN SODIUM 80 MG PO TABS: 80.0000 mg | ORAL_TABLET | Freq: Every morning | ORAL | 3 refills | Status: AC

## 2024-04-08 NOTE — Patient Instructions (Addendum)
 Medication Instructions:  INCREASE Pravastatin  to 80mg  Take 1 tablet once a day  You can take an additional half tablet of metoprolol  as needed for palpitations  *If you need a refill on your cardiac medications before your next appointment, please call your pharmacy*  Lab Work: None ordered If you have labs (blood work) drawn today and your tests are completely normal, you will receive your results only by: MyChart Message (if you have MyChart) OR A paper copy in the mail If you have any lab test that is abnormal or we need to change your treatment, we will call you to review the results.  Testing/Procedures: None ordered  Follow-Up: At [facility], you and your health needs are our priority.  As part of our continuing mission to provide you with exceptional heart care, our providers are all part of one team.  This team includes your primary Cardiologist (physician) and Advanced Practice Providers or APPs (Physician Assistants and Nurse Practitioners) who all work together to provide you with the care you need, when you need it.  Your next appointment:   6 month(s)  Provider:   Oneil Parchment, MD    We recommend signing up for the patient portal called MyChart.  Sign up information is provided on this After Visit Summary.  MyChart is used to connect with patients for Virtual Visits (Telemedicine).  Patients are able to view lab/test results, encounter notes, upcoming appointments, etc.  Non-urgent messages can be sent to your provider as well.   To learn more about what you can do with MyChart, go to [email or web address].   Other Instructions Continue to monitor your blood pressure
